# Patient Record
Sex: Male | Born: 1955 | Race: Black or African American | Hispanic: No | Marital: Single | Smoking: Current every day smoker
Health system: Southern US, Community
[De-identification: ages and names within clinical notes are randomized; demographics above are authoritative.]

## PROBLEM LIST (undated history)

## (undated) DIAGNOSIS — R55 Syncope and collapse: Secondary | ICD-10-CM

## (undated) DIAGNOSIS — I272 Pulmonary hypertension, unspecified: Secondary | ICD-10-CM

## (undated) DIAGNOSIS — I10 Essential (primary) hypertension: Secondary | ICD-10-CM

## (undated) DIAGNOSIS — I428 Other cardiomyopathies: Secondary | ICD-10-CM

## (undated) DIAGNOSIS — R1013 Epigastric pain: Secondary | ICD-10-CM

## (undated) DIAGNOSIS — Z720 Tobacco use: Secondary | ICD-10-CM

## (undated) DIAGNOSIS — D72819 Decreased white blood cell count, unspecified: Secondary | ICD-10-CM

## (undated) DIAGNOSIS — R05 Cough: Secondary | ICD-10-CM

## (undated) DIAGNOSIS — I5042 Chronic combined systolic (congestive) and diastolic (congestive) heart failure: Secondary | ICD-10-CM

## (undated) DIAGNOSIS — F1010 Alcohol abuse, uncomplicated: Secondary | ICD-10-CM

## (undated) DIAGNOSIS — H6690 Otitis media, unspecified, unspecified ear: Secondary | ICD-10-CM

## (undated) HISTORY — DX: Other cardiomyopathies: I42.8

## (undated) HISTORY — DX: Alcohol abuse, uncomplicated: F10.10

## (undated) HISTORY — DX: Cough: R05

## (undated) HISTORY — DX: Otitis media, unspecified, unspecified ear: H66.90

## (undated) HISTORY — PX: CLAVICLE SURGERY: SHX598

## (undated) HISTORY — DX: Chronic combined systolic (congestive) and diastolic (congestive) heart failure: I50.42

## (undated) HISTORY — DX: Epigastric pain: R10.13

## (undated) HISTORY — DX: Pulmonary hypertension, unspecified: I27.20

## (undated) HISTORY — DX: Syncope and collapse: R55

## (undated) HISTORY — DX: Essential (primary) hypertension: I10

## (undated) HISTORY — PX: STOMACH SURGERY: SHX791

## (undated) HISTORY — DX: Tobacco use: Z72.0

## (undated) HISTORY — DX: Decreased white blood cell count, unspecified: D72.819

---

## 1997-10-24 ENCOUNTER — Emergency Department (HOSPITAL_COMMUNITY): Admission: EM | Admit: 1997-10-24 | Discharge: 1997-10-24 | Payer: Self-pay | Admitting: Emergency Medicine

## 2002-09-28 ENCOUNTER — Emergency Department (HOSPITAL_COMMUNITY): Admission: EM | Admit: 2002-09-28 | Discharge: 2002-09-28 | Payer: Self-pay | Admitting: Emergency Medicine

## 2007-12-18 ENCOUNTER — Emergency Department (HOSPITAL_COMMUNITY): Admission: EM | Admit: 2007-12-18 | Discharge: 2007-12-18 | Payer: Self-pay | Admitting: Emergency Medicine

## 2007-12-18 IMAGING — CR DG KNEE COMPLETE 4+V*R*
4 series · 4 of 4 positions shown · non-contrast
Comparison: None

CLINICAL DATA: Right knee pain

RIGHT KNEE - COMPLETE 4+ VIEW

[t knee ap right]
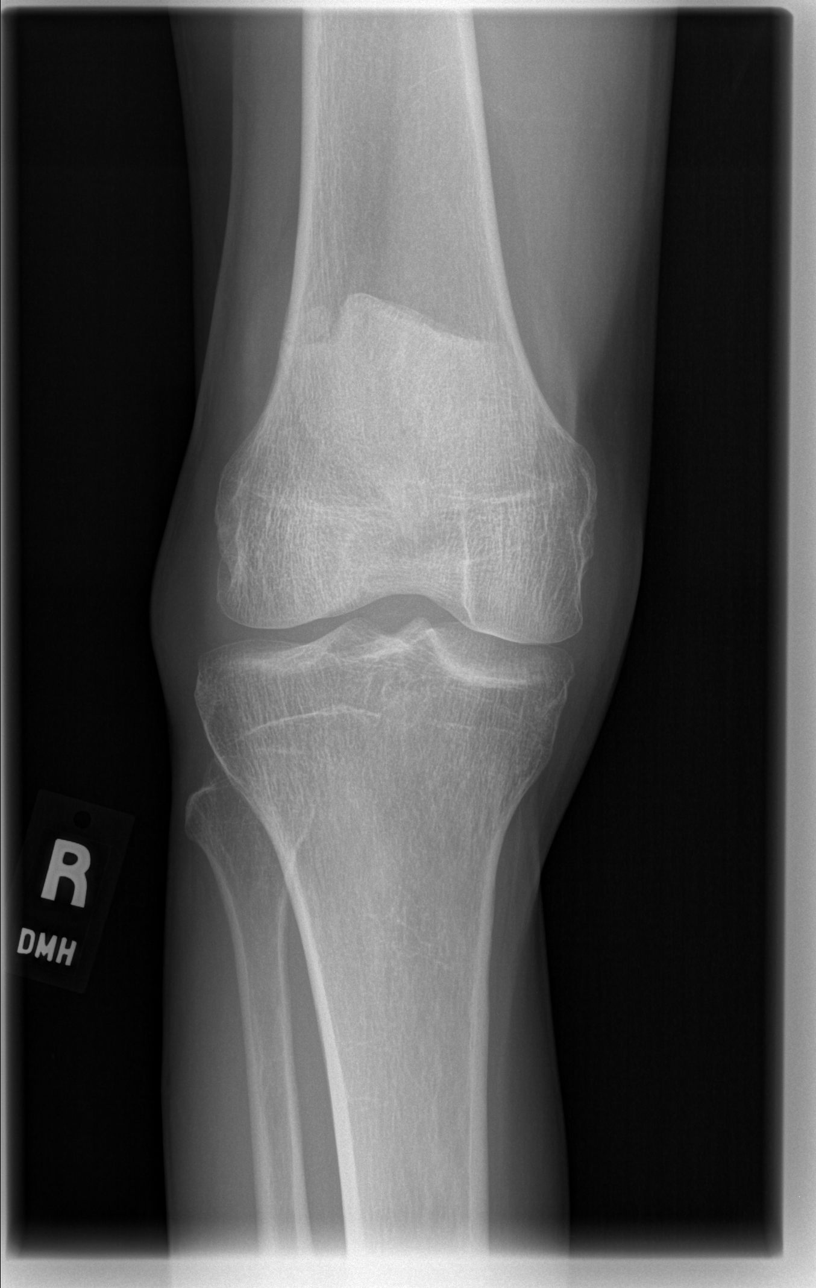

[t knee oblique right (1 of 2)]
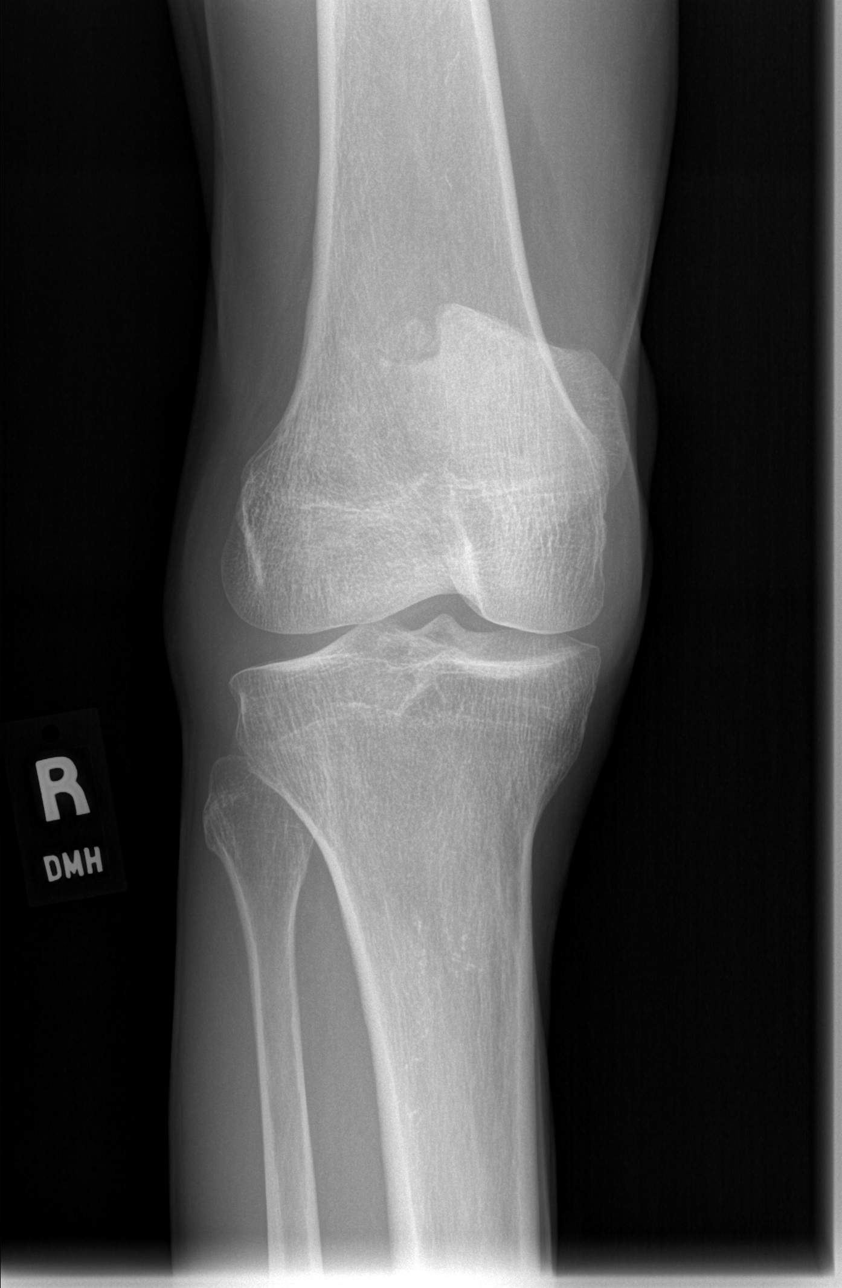

[t knee oblique right (2 of 2)]
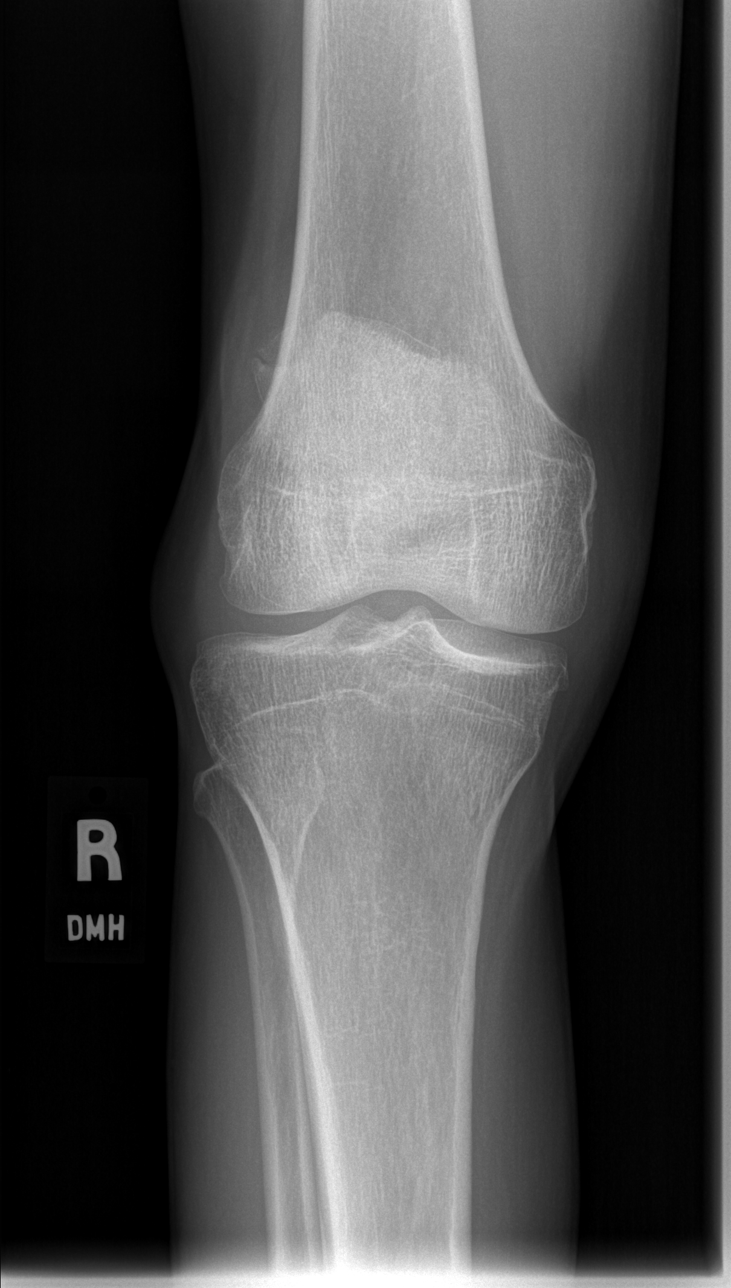

[t knee lat right]
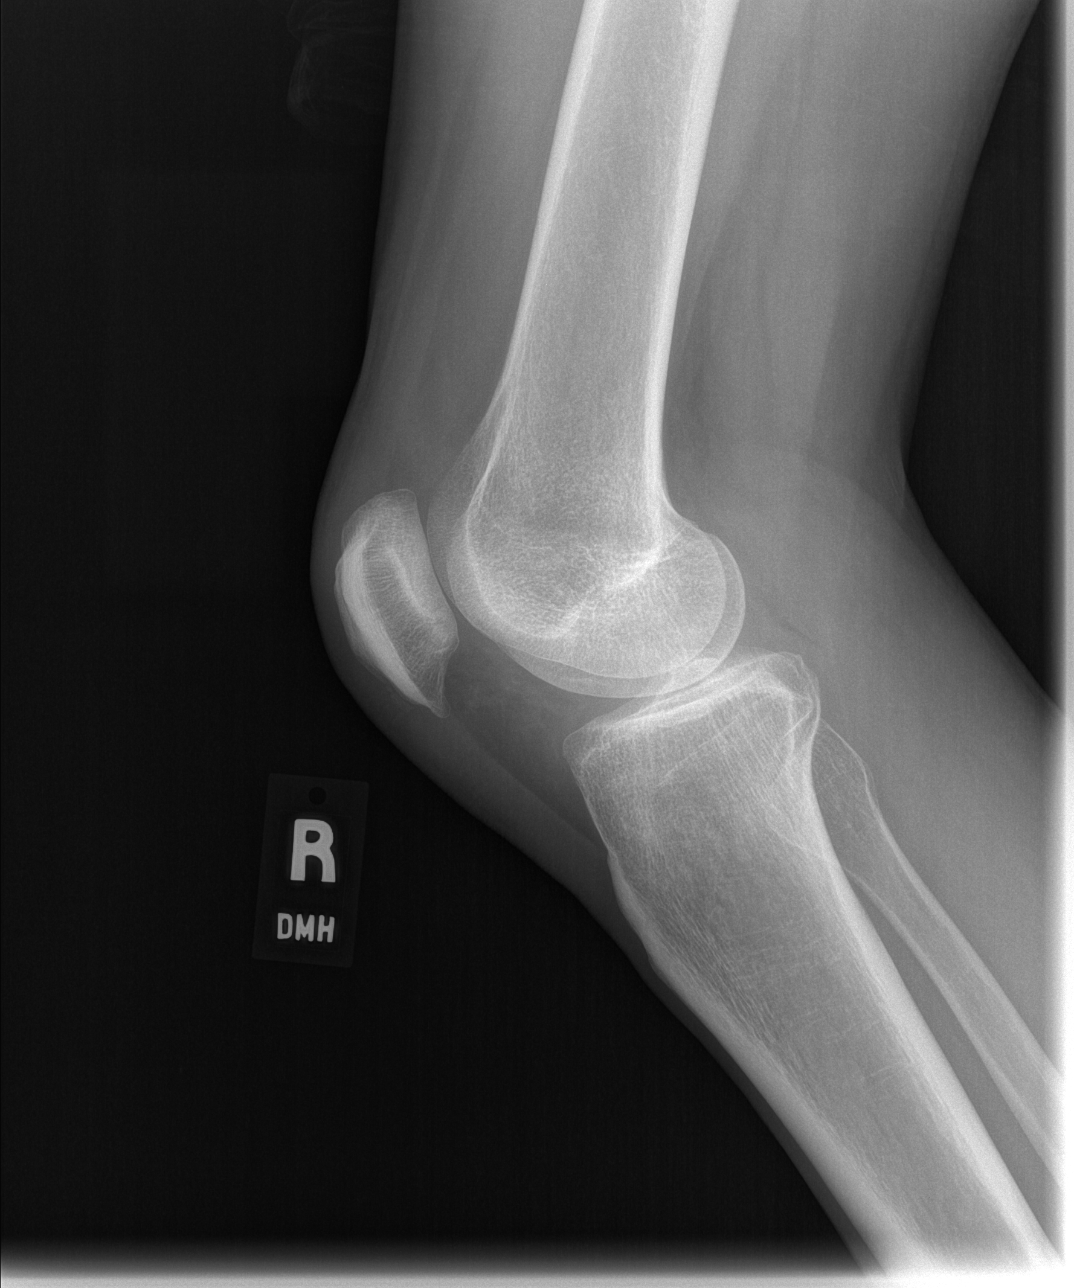

[4 of 4 positions shown; findings below may reference images not displayed]

FINDINGS: No fracture or subluxation.  No joint effusion.
Incidental bipartite patella.
IMPRESSION: No acute findings.

## 2011-02-03 ENCOUNTER — Inpatient Hospital Stay (HOSPITAL_COMMUNITY)
Admission: EM | Admit: 2011-02-03 | Discharge: 2011-02-03 | DRG: 494 | Disposition: A | Discharge status: Home / Self Care | Payer: Self-pay | Attending: Orthopedic Surgery | Admitting: Orthopedic Surgery

## 2011-02-03 ENCOUNTER — Emergency Department (HOSPITAL_COMMUNITY): Payer: Self-pay

## 2011-02-03 ENCOUNTER — Other Ambulatory Visit: Payer: Self-pay

## 2011-02-03 ENCOUNTER — Encounter (HOSPITAL_COMMUNITY)
Admission: EM | Disposition: A | Discharge status: Home / Self Care | Payer: Self-pay | Source: Home / Self Care | Attending: Orthopedic Surgery

## 2011-02-03 ENCOUNTER — Emergency Department (HOSPITAL_COMMUNITY): Payer: Self-pay | Admitting: Anesthesiology

## 2011-02-03 ENCOUNTER — Encounter: Payer: Self-pay | Admitting: *Deleted

## 2011-02-03 ENCOUNTER — Encounter (HOSPITAL_COMMUNITY): Payer: Self-pay | Admitting: Anesthesiology

## 2011-02-03 ENCOUNTER — Encounter (HOSPITAL_COMMUNITY): Payer: Self-pay | Admitting: *Deleted

## 2011-02-03 DIAGNOSIS — F17200 Nicotine dependence, unspecified, uncomplicated: Secondary | ICD-10-CM | POA: Diagnosis present

## 2011-02-03 DIAGNOSIS — I10 Essential (primary) hypertension: Secondary | ICD-10-CM | POA: Diagnosis present

## 2011-02-03 DIAGNOSIS — S82853A Displaced trimalleolar fracture of unspecified lower leg, initial encounter for closed fracture: Principal | ICD-10-CM | POA: Diagnosis present

## 2011-02-03 DIAGNOSIS — W010XXA Fall on same level from slipping, tripping and stumbling without subsequent striking against object, initial encounter: Secondary | ICD-10-CM | POA: Diagnosis present

## 2011-02-03 HISTORY — PX: ORIF ANKLE FRACTURE: SHX5408

## 2011-02-03 LAB — CBC
MCHC: 33.3 g/dL (ref 30.0–36.0)
MCV: 95.6 fL (ref 78.0–100.0)
Platelets: 230 10*3/uL (ref 150–400)
RBC: 3.86 MIL/uL — ABNORMAL LOW (ref 4.22–5.81)
RDW: 14 % (ref 11.5–15.5)

## 2011-02-03 LAB — POCT I-STAT, CHEM 8
BUN: 14 mg/dL (ref 6–23)
Creatinine, Ser: 0.9 mg/dL (ref 0.50–1.35)
Sodium: 141 mEq/L (ref 135–145)
TCO2: 23 mmol/L (ref 0–100)

## 2011-02-03 LAB — POCT I-STAT TROPONIN I: Troponin i, poc: 0.01 ng/mL (ref 0.00–0.08)

## 2011-02-03 IMAGING — CR DG ANKLE COMPLETE 3+V*L*
3 series · 3 of 3 positions shown · non-contrast
Comparison: None.

CLINICAL DATA: Left ankle pain

LEFT ANKLE COMPLETE - 3+ VIEW

[x ankle ap left]
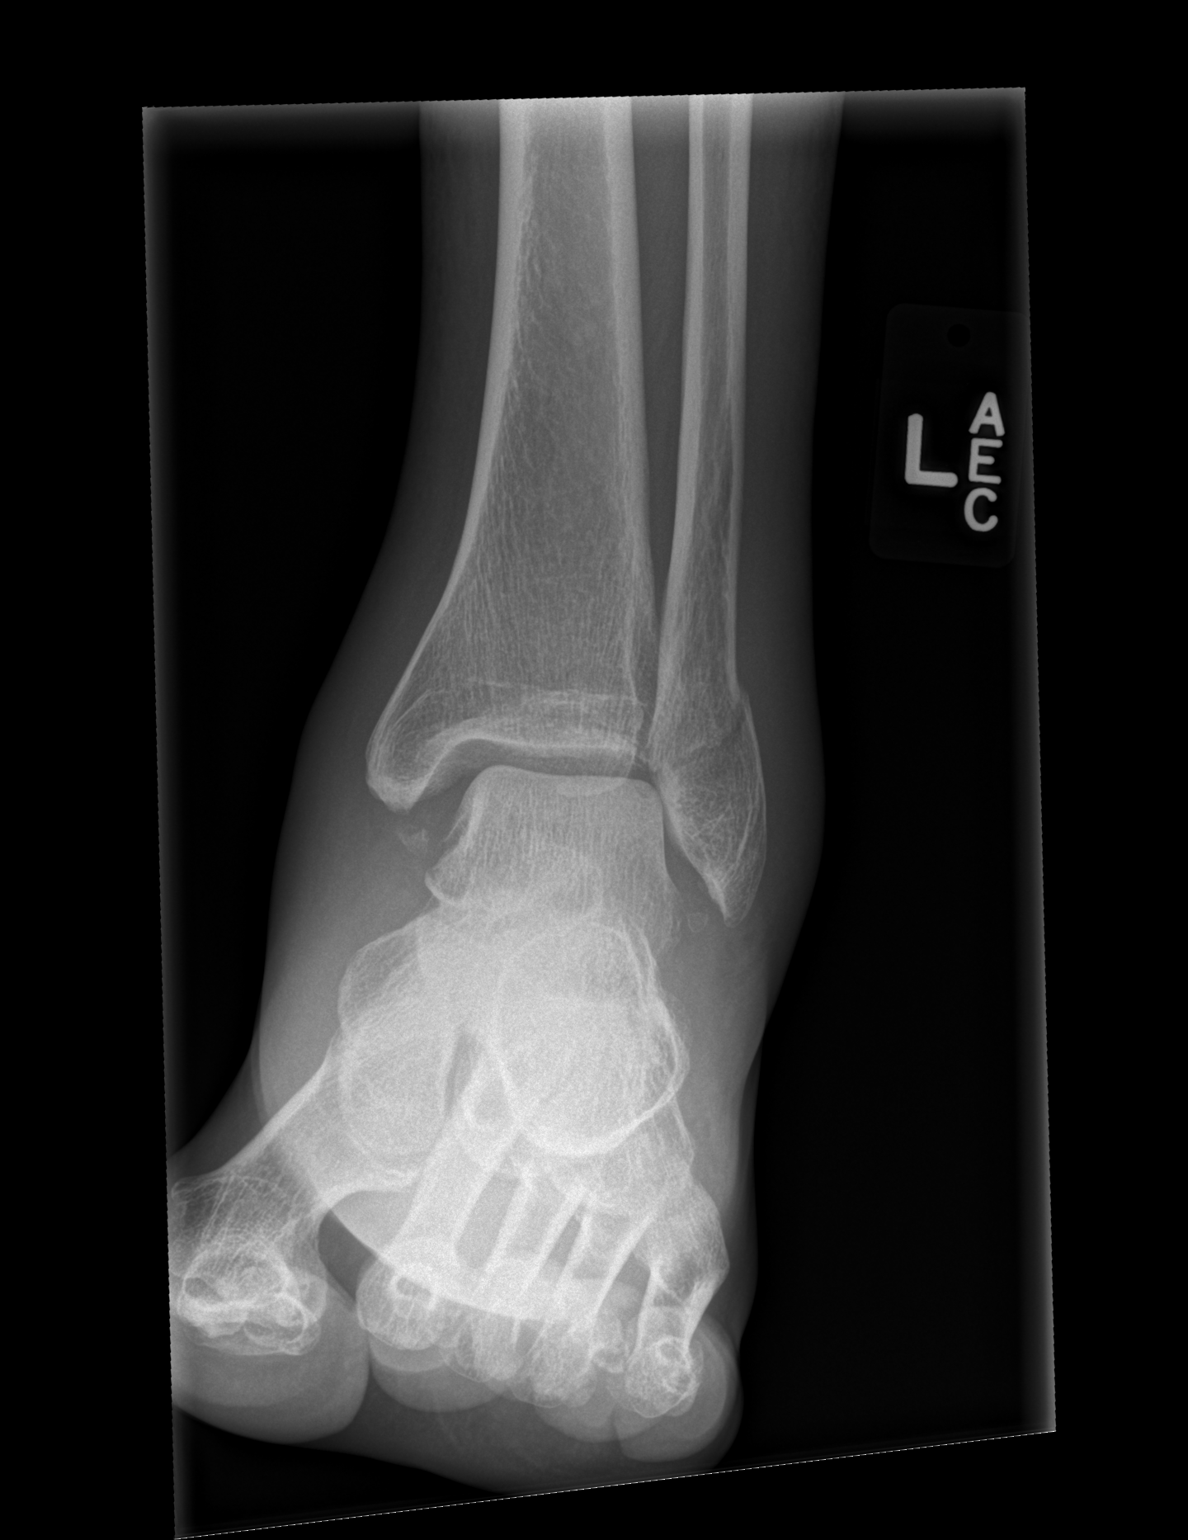

[x ankle obl left]
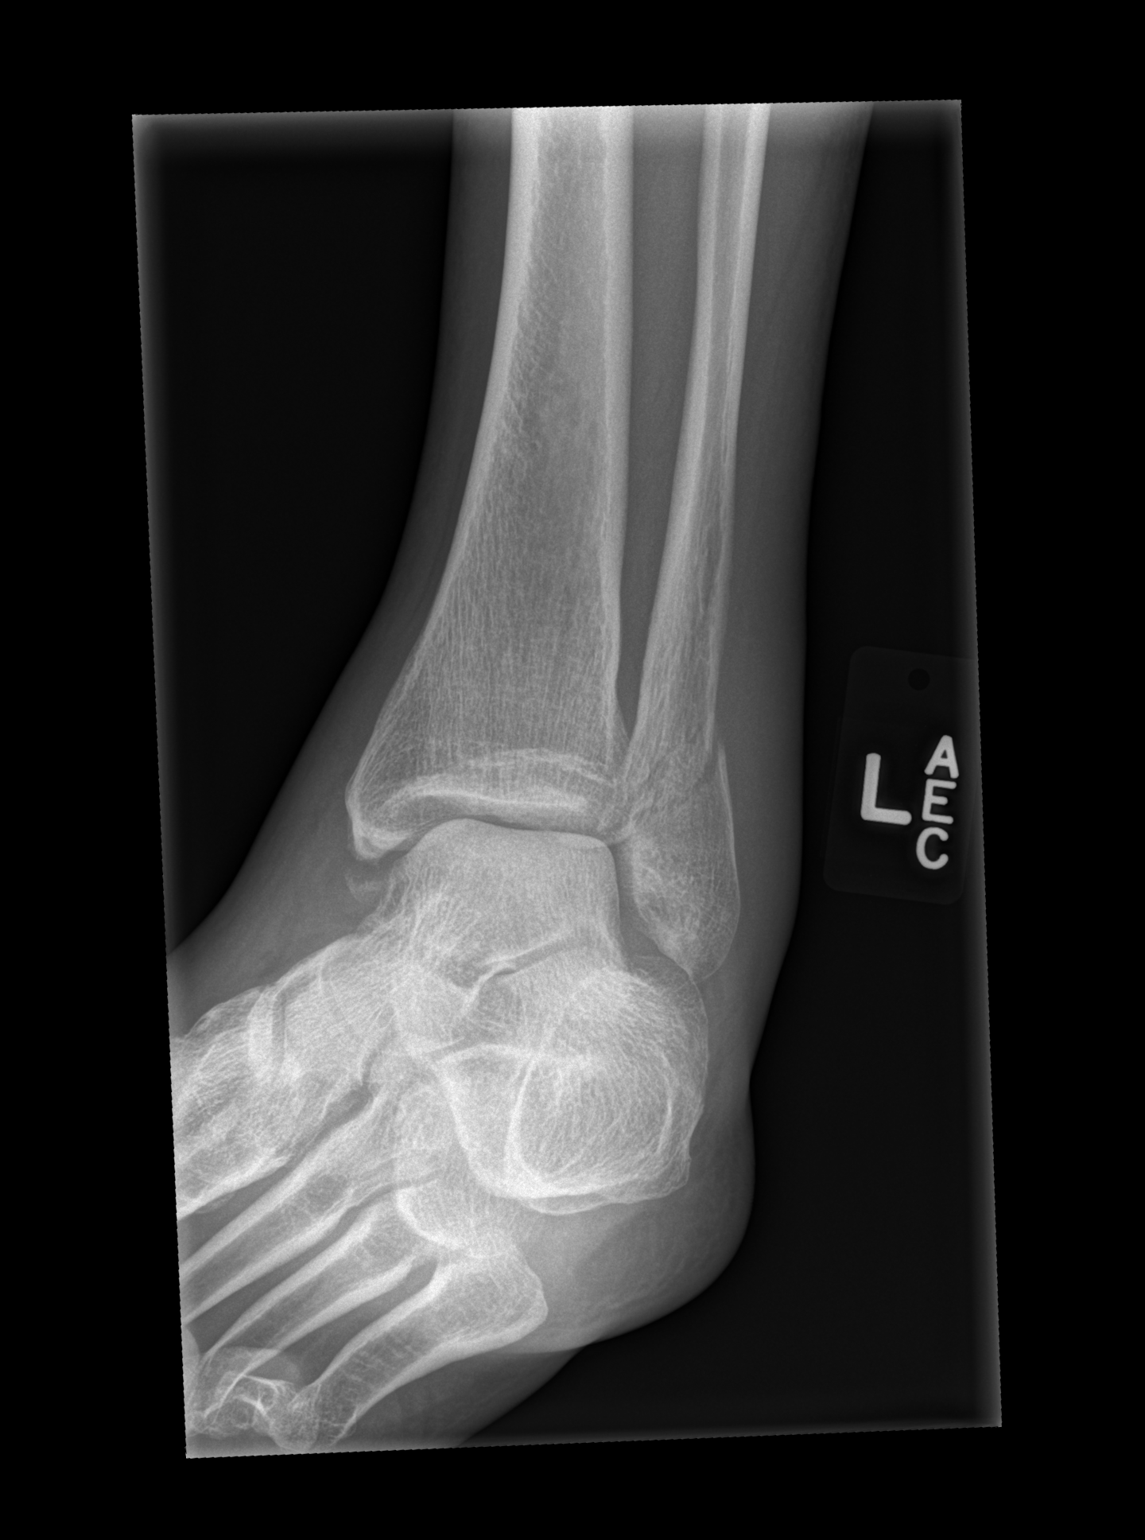

[x ankle lat left]
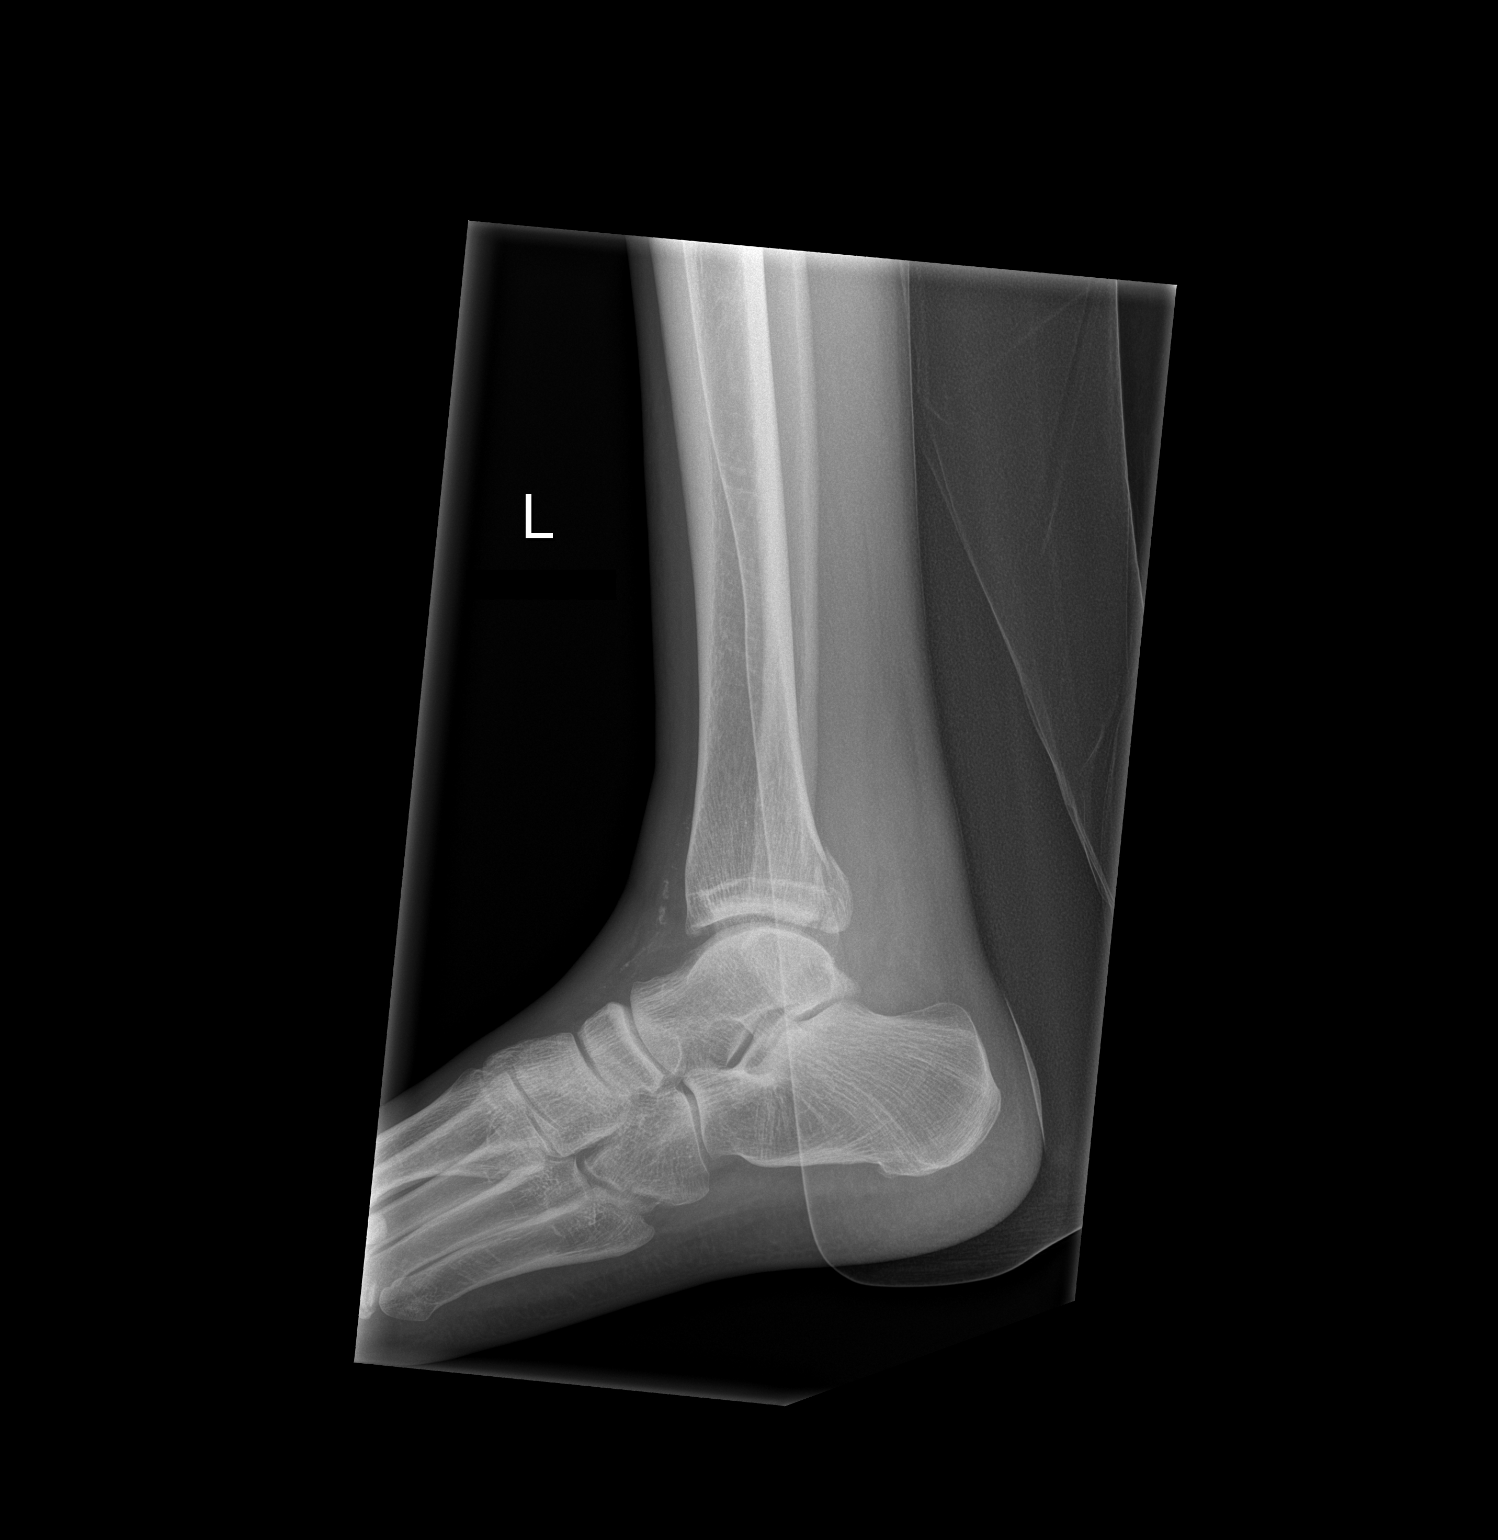

[3 of 3 positions shown; findings below may reference images not displayed]

FINDINGS: There is a trimalleolar fracture.  There is mild lateral
displacement of the ankle mortise.

Mild diffuse soft tissue swelling identified.
IMPRESSION: 1.  Findings compatible with mildly displaced trimalleolar
fracture.

## 2011-02-03 IMAGING — CR DG CHEST 2V
2 series · 2 of 2 positions shown · non-contrast
Comparison: None

CLINICAL DATA: 54-year-old male - preoperative respiratory
examination for ankle surgery.

CHEST - 2 VIEW

[w chest lat]
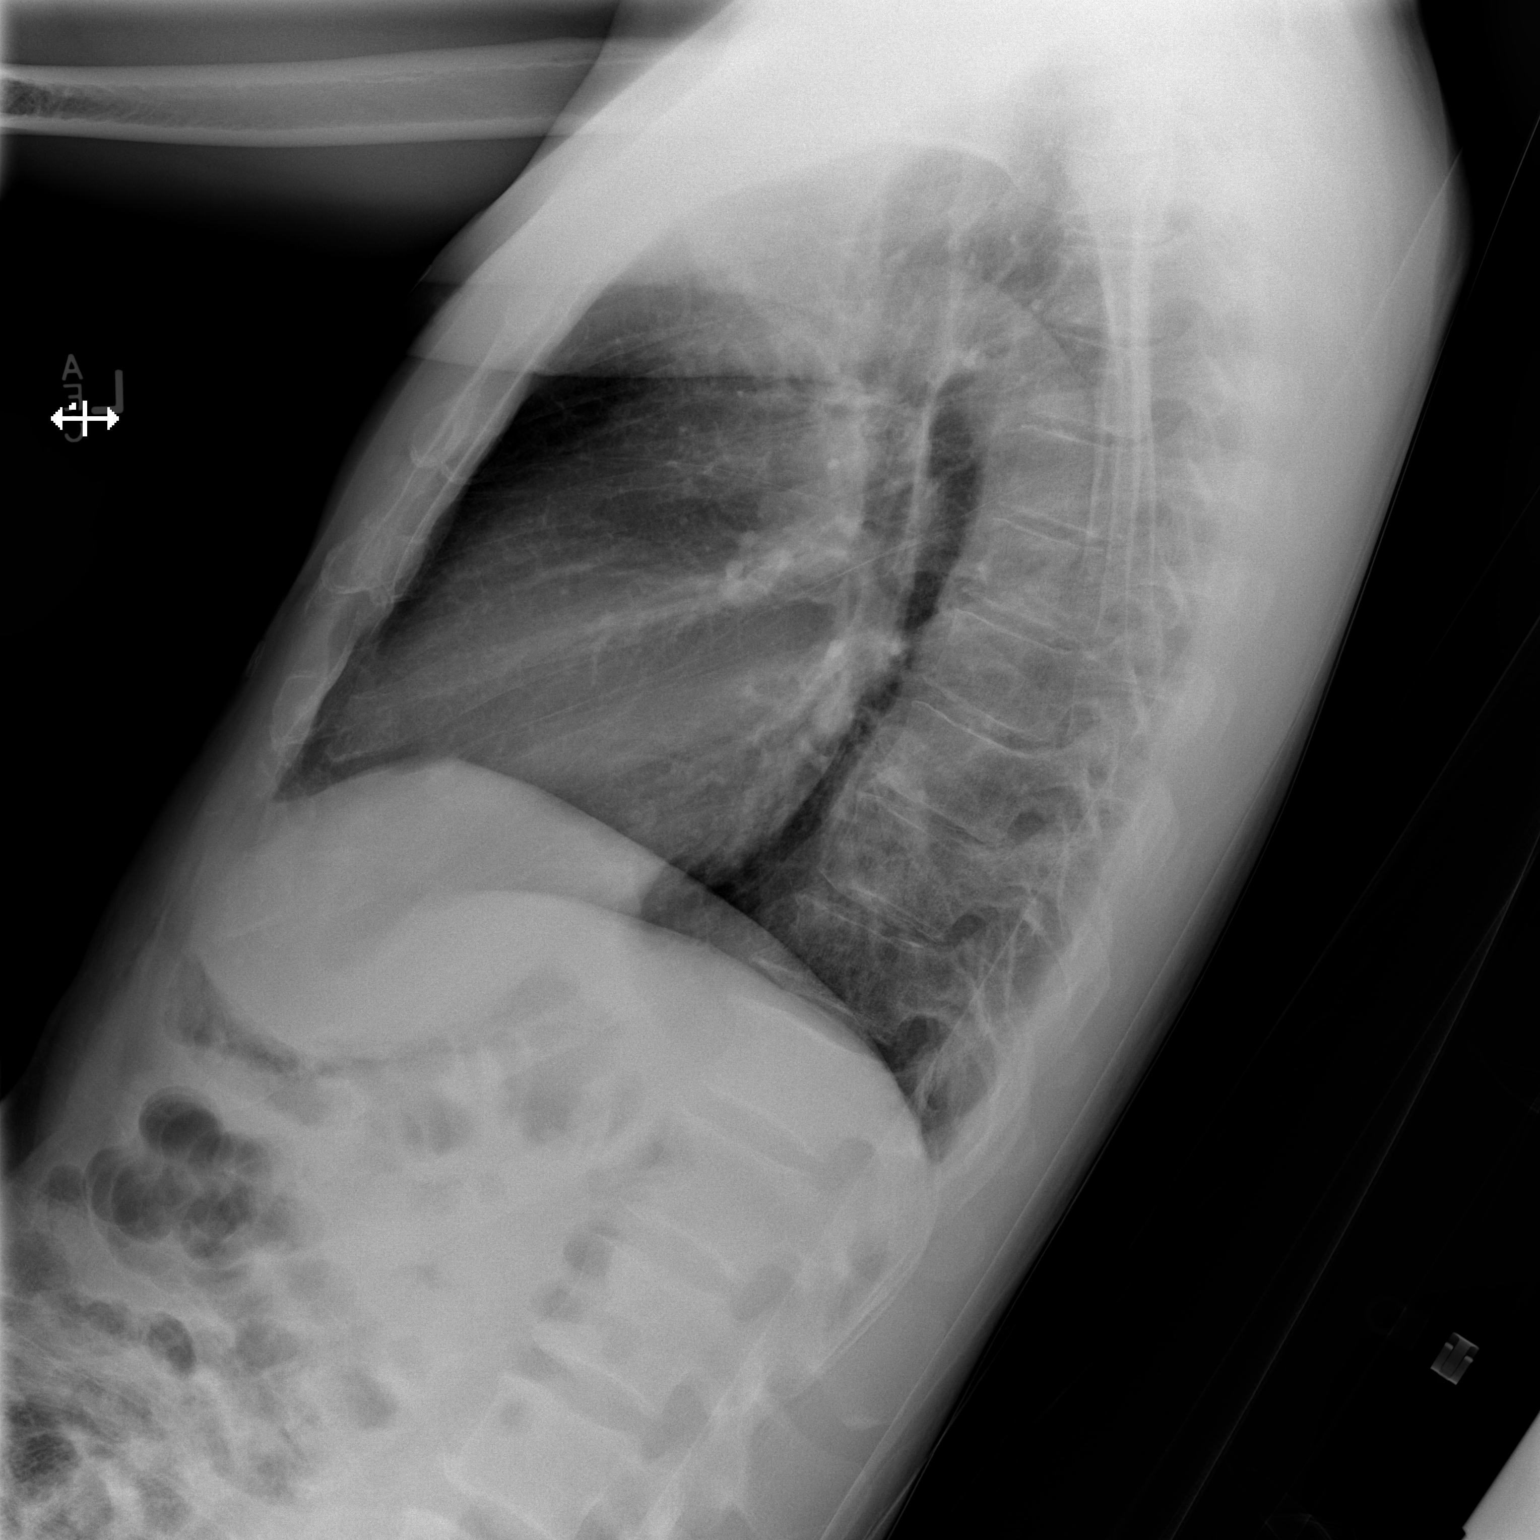

[x chest ap]
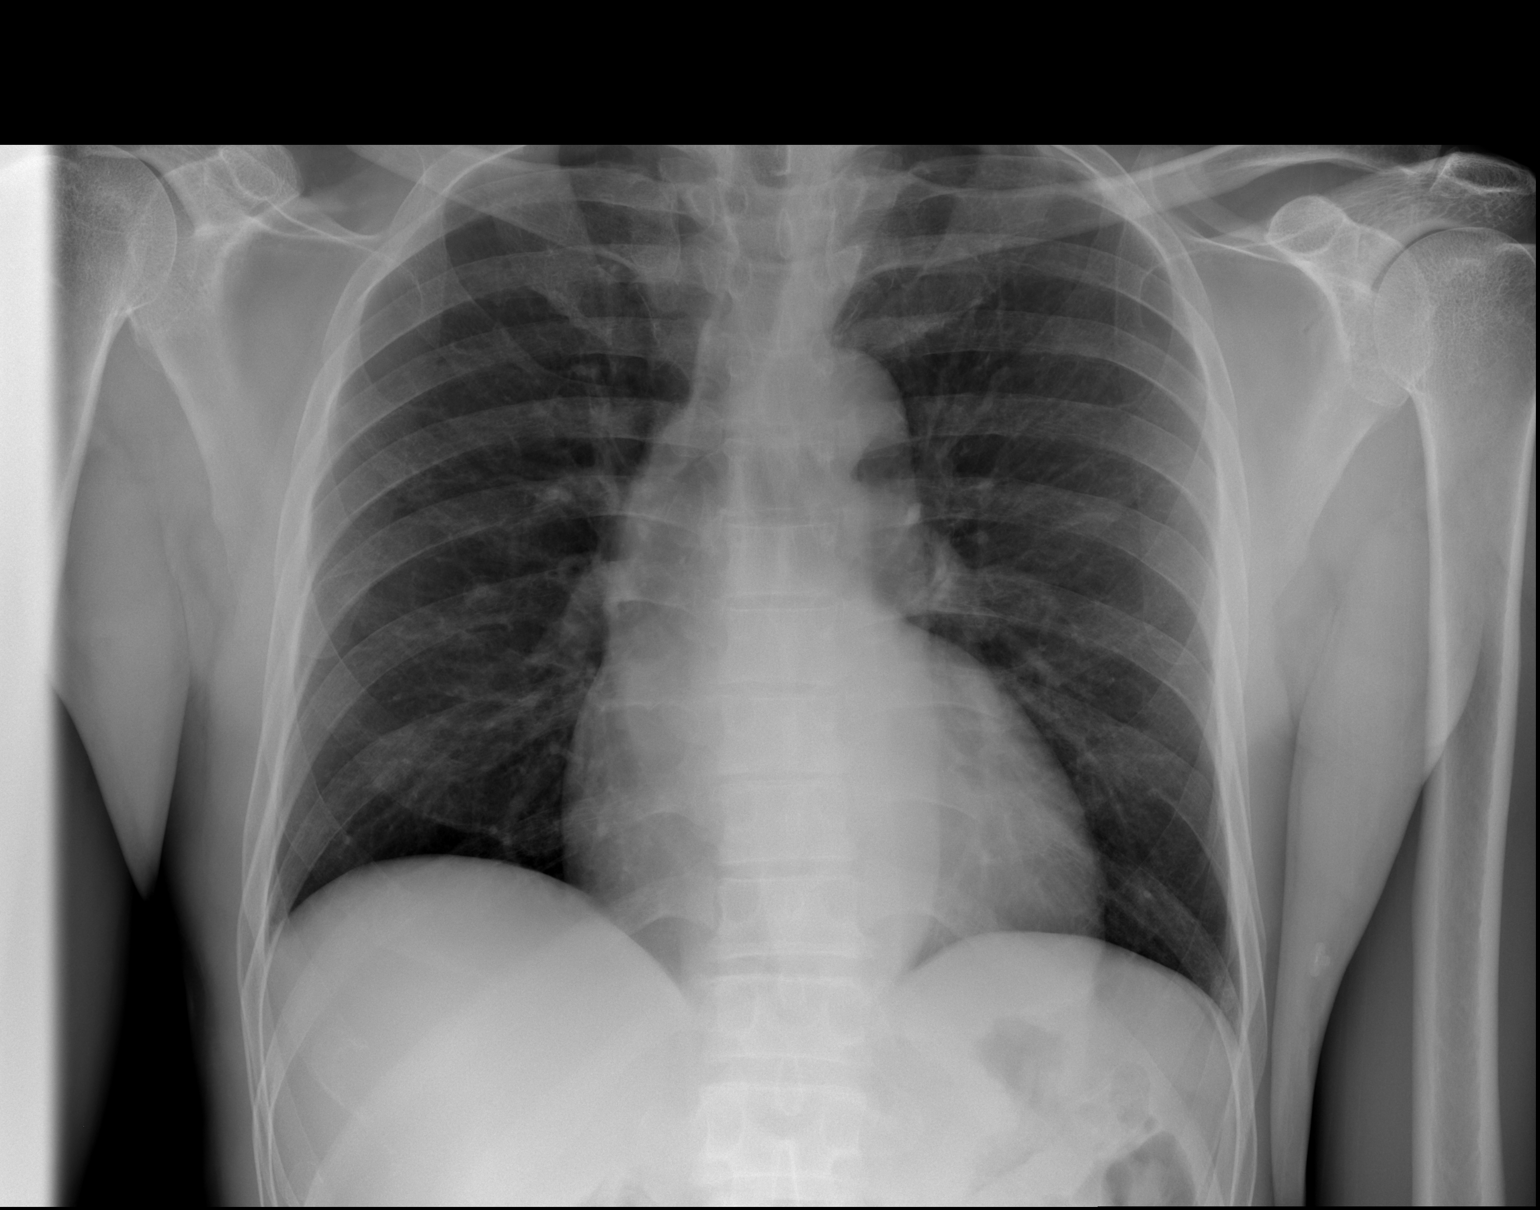

[2 of 2 positions shown; findings below may reference images not displayed]

FINDINGS: Mild cardiomegaly is noted.
The lungs are clear.
There is no evidence of focal airspace disease, pulmonary edema,
pulmonary nodule/mass, pleural effusion, or pneumothorax.
No acute bony abnormalities are identified.
IMPRESSION: Mild cardiomegaly without evidence of active cardiopulmonary
disease.

## 2011-02-03 SURGERY — OPEN REDUCTION INTERNAL FIXATION (ORIF) ANKLE FRACTURE
Anesthesia: General | Site: Ankle | Laterality: Left | Wound class: Clean

## 2011-02-03 SURGERY — OPEN REDUCTION INTERNAL FIXATION (ORIF) ANKLE FRACTURE
Anesthesia: General | Laterality: Left

## 2011-02-03 MED ORDER — PROMETHAZINE HCL 25 MG/ML IJ SOLN: 6.2500 mg | INTRAMUSCULAR | Status: DC | PRN

## 2011-02-03 MED ORDER — LACTATED RINGERS IV SOLN: INTRAVENOUS | Status: DC

## 2011-02-03 MED ORDER — HYDROMORPHONE HCL PF 1 MG/ML IJ SOLN
0.5000 mg | INTRAMUSCULAR | Status: DC | PRN
  Administered 2011-02-03: 0.5 mg via INTRAVENOUS
  Filled 2011-02-03: qty 1

## 2011-02-03 MED ORDER — BUPIVACAINE-EPINEPHRINE PF 0.5-1:200000 % IJ SOLN
INTRAMUSCULAR | Status: DC | PRN
  Administered 2011-02-03: 10 mL

## 2011-02-03 MED ORDER — HYDROMORPHONE HCL PF 1 MG/ML IJ SOLN: 0.2500 mg | INTRAMUSCULAR | Status: DC | PRN

## 2011-02-03 MED ORDER — LIDOCAINE HCL (CARDIAC) 20 MG/ML IV SOLN
INTRAVENOUS | Status: DC | PRN
  Administered 2011-02-03: 100 mg via INTRAVENOUS

## 2011-02-03 MED ORDER — FENTANYL CITRATE 0.05 MG/ML IJ SOLN
100.0000 ug | Freq: Once | INTRAMUSCULAR | Status: AC
  Administered 2011-02-03: 100 ug via INTRAMUSCULAR
  Filled 2011-02-03: qty 2

## 2011-02-03 MED ORDER — ACETAMINOPHEN 10 MG/ML IV SOLN
INTRAVENOUS | Status: DC | PRN
  Administered 2011-02-03: 1000 mg via INTRAVENOUS

## 2011-02-03 MED ORDER — OXYCODONE HCL 5 MG PO TABS: ORAL_TABLET | ORAL | Status: DC

## 2011-02-03 MED ORDER — ACETAMINOPHEN 10 MG/ML IV SOLN
INTRAVENOUS | Status: AC
  Filled 2011-02-03: qty 100

## 2011-02-03 MED ORDER — SUCCINYLCHOLINE CHLORIDE 20 MG/ML IJ SOLN
INTRAMUSCULAR | Status: DC | PRN
  Administered 2011-02-03: 80 mg via INTRAVENOUS

## 2011-02-03 MED ORDER — CEFAZOLIN SODIUM 1-5 GM-% IV SOLN
INTRAVENOUS | Status: DC | PRN
  Administered 2011-02-03: 1 g via INTRAVENOUS

## 2011-02-03 MED ORDER — LACTATED RINGERS IV SOLN
INTRAVENOUS | Status: DC | PRN
  Administered 2011-02-03 (×2): via INTRAVENOUS

## 2011-02-03 MED ORDER — SUFENTANIL CITRATE 50 MCG/ML IV SOLN
INTRAVENOUS | Status: DC | PRN
  Administered 2011-02-03: 20 ug via INTRAVENOUS
  Administered 2011-02-03: 10 ug via INTRAVENOUS

## 2011-02-03 MED ORDER — CEFAZOLIN SODIUM 1-5 GM-% IV SOLN
INTRAVENOUS | Status: AC
  Filled 2011-02-03: qty 50

## 2011-02-03 MED ORDER — OXYCODONE HCL 5 MG PO TABS
10.0000 mg | ORAL_TABLET | ORAL | Status: DC | PRN
  Administered 2011-02-03: 5 mg via ORAL
  Filled 2011-02-03: qty 1

## 2011-02-03 MED ORDER — SODIUM CHLORIDE 0.9 % IR SOLN
Status: DC | PRN
  Administered 2011-02-03: 1000 mL

## 2011-02-03 MED ORDER — SODIUM CHLORIDE 0.9 % IV SOLN: INTRAVENOUS | Status: DC

## 2011-02-03 MED ORDER — PROPOFOL 10 MG/ML IV EMUL
INTRAVENOUS | Status: DC | PRN
  Administered 2011-02-03: 180 mg via INTRAVENOUS

## 2011-02-03 MED ORDER — EPHEDRINE SULFATE 50 MG/ML IJ SOLN
INTRAMUSCULAR | Status: DC | PRN
  Administered 2011-02-03 (×2): 10 mg via INTRAVENOUS

## 2011-02-03 MED ORDER — BUPIVACAINE HCL (PF) 0.5 % IJ SOLN
INTRAMUSCULAR | Status: AC
  Filled 2011-02-03: qty 30

## 2011-02-03 MED ORDER — KETOROLAC TROMETHAMINE 30 MG/ML IJ SOLN
INTRAMUSCULAR | Status: DC | PRN
  Administered 2011-02-03: 30 mg via INTRAVENOUS

## 2011-02-03 MED ORDER — ONDANSETRON HCL 4 MG/2ML IJ SOLN
INTRAMUSCULAR | Status: DC | PRN
  Administered 2011-02-03: 4 mg via INTRAVENOUS

## 2011-02-03 SURGICAL SUPPLY — 48 items
BAG SPEC THK2 15X12 ZIP CLS (MISCELLANEOUS) ×1
BAG ZIPLOCK 12X15 (MISCELLANEOUS) ×2 IMPLANT
BANDAGE ELASTIC 4 VELCRO ST LF (GAUZE/BANDAGES/DRESSINGS) ×2 IMPLANT
BIT DRILL 2.5X2.75 QC CALB (BIT) ×1 IMPLANT
BIT DRILL 2.9X70 QC CALB (BIT) ×1 IMPLANT
CLOTH BEACON ORANGE TIMEOUT ST (SAFETY) ×2 IMPLANT
COVER SURGICAL LIGHT HANDLE (MISCELLANEOUS) ×1 IMPLANT
CUFF TOURN SGL QUICK 34 (TOURNIQUET CUFF) ×2
CUFF TRNQT CYL 34X4X40X1 (TOURNIQUET CUFF) ×1 IMPLANT
DRAPE C-ARM 42X72 X-RAY (DRAPES) ×2 IMPLANT
DRAPE U-SHAPE 47X51 STRL (DRAPES) ×2 IMPLANT
DRSG ADAPTIC 3X8 NADH LF (GAUZE/BANDAGES/DRESSINGS) ×2 IMPLANT
DRSG PAD ABDOMINAL 8X10 ST (GAUZE/BANDAGES/DRESSINGS) ×2 IMPLANT
ELECT REM PT RETURN 9FT ADLT (ELECTROSURGICAL) ×2
ELECTRODE REM PT RTRN 9FT ADLT (ELECTROSURGICAL) ×1 IMPLANT
GAUZE KERLIX 2  STERILE LF (GAUZE/BANDAGES/DRESSINGS) ×1 IMPLANT
GAUZE XEROFORM 4X4 STRL (GAUZE/BANDAGES/DRESSINGS) ×1 IMPLANT
GLOVE BIO SURGEON STRL SZ7.5 (GLOVE) ×2 IMPLANT
GLOVE SURG ORTHO 7.0 STRL STRW (GLOVE) ×2 IMPLANT
GLOVE SURG ORTHO 8.0 STRL STRW (GLOVE) ×2 IMPLANT
GOWN STRL REIN XL XLG (GOWN DISPOSABLE) ×4 IMPLANT
MANIFOLD NEPTUNE II (INSTRUMENTS) ×2 IMPLANT
PACK LOWER EXTREMITY WL (CUSTOM PROCEDURE TRAY) ×2 IMPLANT
PAD CAST 4YDX4 CTTN HI CHSV (CAST SUPPLIES) ×2 IMPLANT
PADDING CAST 4 (CAST SUPPLIES) ×1 IMPLANT
PADDING CAST COTTON 4X4 STRL (CAST SUPPLIES) ×4
PADDING WEBRIL 4 STERILE (GAUZE/BANDAGES/DRESSINGS) ×1 IMPLANT
PLATE ACE 100DEG 8HOLE (Plate) ×1 IMPLANT
POSITIONER SURGICAL ARM (MISCELLANEOUS) ×2 IMPLANT
SCREW CORTICAL 3.5MM  12MM (Screw) ×2 IMPLANT
SCREW CORTICAL 3.5MM  20MM (Screw) ×1 IMPLANT
SCREW CORTICAL 3.5MM 12MM (Screw) IMPLANT
SCREW CORTICAL 3.5MM 14MM (Screw) ×1 IMPLANT
SCREW CORTICAL 3.5MM 18MM (Screw) ×1 IMPLANT
SCREW CORTICAL 3.5MM 20MM (Screw) IMPLANT
SCREW LAG CANC FT 4X48 (Screw) ×1 IMPLANT
SCREW NLOCK CANC HEX 4X55 (Screw) ×1 IMPLANT
SPLINT DURACAST 5 X20' (CAST SUPPLIES) ×1 IMPLANT
SPLINT FIBERGLASS 5X30 (CAST SUPPLIES) ×1 IMPLANT
SPONGE GAUZE 4X4 12PLY (GAUZE/BANDAGES/DRESSINGS) ×2 IMPLANT
STAPLER VISISTAT 35W (STAPLE) ×1 IMPLANT
STRIP CLOSURE SKIN 1/2X4 (GAUZE/BANDAGES/DRESSINGS) ×1 IMPLANT
SUT MNCRL AB 4-0 PS2 18 (SUTURE) ×1 IMPLANT
SUT VIC AB 1 CT1 27 (SUTURE) ×2
SUT VIC AB 1 CT1 27XBRD ANTBC (SUTURE) ×2 IMPLANT
SUT VIC AB 2-0 CT1 27 (SUTURE) ×2
SUT VIC AB 2-0 CT1 TAPERPNT 27 (SUTURE) ×1 IMPLANT
TOWEL OR 17X26 10 PK STRL BLUE (TOWEL DISPOSABLE) ×4 IMPLANT

## 2011-02-03 NOTE — H&P (Signed)
Darin Brady is an 55 y.o. male.   Chief Complaint: left ankle fracture HPI: pt slipped off curb had immediate pain in left ankle.  Seen in [facility] ED.    History reviewed. No pertinent past medical history. HTN no current meds  History reviewed. No pertinent past surgical history. Shoulder surgery Abdominal surgery   No family history on file. Social History:  reports that he has been smoking.  He does not have any smokeless tobacco history on file. He reports that he drinks alcohol. He reports that he does not use illicit drugs.  Allergies: No Known Allergies  Medications Prior to Admission  Medication Dose Route Frequency Provider Last Rate Last Dose  . fentaNYL (SUBLIMAZE) injection 100 mcg  100 mcg Intramuscular Once [place], PA   100 mcg at 02/03/11 9147   No current outpatient prescriptions on file as of 02/03/2011.    Results for orders placed during the hospital encounter of 02/03/11 (from the past 48 hour(s))  CBC     Status: Abnormal   Collection Time   02/03/11  9:15 AM      Component Value Range Comment   WBC 7.8  4.0 - 10.5 (K/uL)    RBC 3.86 (*) 4.22 - 5.81 (MIL/uL)    Hemoglobin 12.3 (*) 13.0 - 17.0 (g/dL)    HCT 82.9 (*) 56.2 - 52.0 (%)    MCV 95.6  78.0 - 100.0 (fL)    MCH 31.9  26.0 - 34.0 (pg)    MCHC 33.3  30.0 - 36.0 (g/dL)    RDW 13.0  86.5 - 78.4 (%)    Platelets 230  150 - 400 (K/uL)   POCT I-STAT, CHEM 8     Status: Normal   Collection Time   02/03/11  9:34 AM      Component Value Range Comment   Sodium 141  135 - 145 (mEq/L)    Potassium 4.2  3.5 - 5.1 (mEq/L)    Chloride 105  96 - 112 (mEq/L)    BUN 14  6 - 23 (mg/dL)    Creatinine, Ser 6.96  0.50 - 1.35 (mg/dL)    Glucose, Bld 91  70 - 99 (mg/dL)    Calcium, Ion 2.95  1.12 - 1.32 (mmol/L)    TCO2 23  0 - 100 (mmol/L)    Hemoglobin 14.3  13.0 - 17.0 (g/dL)    HCT 28.4  13.2 - 44.0 (%)    Dg Chest 2 View  02/03/2011  *RADIOLOGY REPORT*  Clinical Data: 55 year old male -  preoperative respiratory examination for ankle surgery.  CHEST - 2 VIEW  Comparison: None  Findings: Mild cardiomegaly is noted. The lungs are clear. There is no evidence of focal airspace disease, pulmonary edema, pulmonary nodule/mass, pleural effusion, or pneumothorax. No acute bony abnormalities are identified.  IMPRESSION: Mild cardiomegaly without evidence of active cardiopulmonary disease.  Original Report Authenticated By: Rosendo Gros, M.D.   Dg Ankle Complete Left  02/03/2011  *RADIOLOGY REPORT*  Clinical Data: Left ankle pain  LEFT ANKLE COMPLETE - 3+ VIEW  Comparison: None.  Findings: There is a trimalleolar fracture.  There is mild lateral displacement of the ankle mortise.  Mild diffuse soft tissue swelling identified.  IMPRESSION:  1.  Findings compatible with mildly displaced trimalleolar fracture.  Original Report Authenticated By: Rosealee Albee, M.D.    ROS  10 point review of systems was reviewed and is noncontribuitory  Blood pressure 129/90, pulse 99, temperature 99.4 F (37.4  C), temperature source Oral, resp. rate 20, SpO2 98.00%. Physical Exam  Constitutional: He is oriented to person, place, and time. He appears well-developed.  HENT:  Head: Normocephalic.  Musculoskeletal: He exhibits edema and tenderness.       Left ankle: He exhibits decreased range of motion and swelling. tenderness.  Neurological: He is alert and oriented to person, place, and time.  Skin: Skin is warm and dry.     Assessment/Plan Left ankle fractrue. Plan is for ORIF today.  Risks and benefits explained consent obtained.  Keanan Melander JR,W D 02/03/2011, 10:32 AM

## 2011-02-03 NOTE — ED Provider Notes (Signed)
Patient relates he has a history of hypertension however he ran out his medicines a long time ago. He states last night after drinking he was running and he tripped on the curb and fell injuring his left ankle. He states he's unable to bear weight on it. Patient's noted to have diffuse swelling of his left ankle with diffuse pain to palpation. Pulses are intact. Patient last ate yesterday.   Medical screening examination/treatment/procedure(s) were performed by non-physician practitioner and as supervising physician I was immediately available for consultation/collaboration. Devoria Albe, MD, Armando Gang   Ward Givens, MD 02/03/11 [phone]

## 2011-02-03 NOTE — Anesthesia Procedure Notes (Signed)
Procedure Name: Intubation Date/Time: 02/03/2011 11:04 AM Performed by: Lurlean Leyden, Kenny Stern L. Patient Re-evaluated:Patient Re-evaluated prior to inductionOxygen Delivery Method: Circle System Utilized Preoxygenation: Pre-oxygenation with 100% oxygen Intubation Type: IV induction Ventilation: Mask ventilation without difficulty and Oral airway inserted - appropriate to patient size Laryngoscope Size: Miller and 3 Grade View: Grade II Tube type: Oral Tube size: 8.0 mm Number of attempts: 2 Airway Equipment and Method: stylet Placement Confirmation: ETT inserted through vocal cords under direct vision,  breath sounds checked- equal and bilateral and positive ETCO2 Secured at: 21 cm Tube secured with: Tape Dental Injury: Teeth and Oropharynx as per pre-operative assessment

## 2011-02-03 NOTE — ED Provider Notes (Signed)
See prior note   Ward Givens, MD 02/03/11 1601

## 2011-02-03 NOTE — ED Notes (Signed)
Taken to OR via stretcher--Stable. 

## 2011-02-03 NOTE — Progress Notes (Signed)
Physical Therapy Evaluation Patient Details Name: Darin Brady MRN: [id number] DOB: Jul 15, 1955 Today's Date: 02/03/2011 eval 1 [id number]  Problem List: There is no problem list on file for this patient.   Past Medical History:  Past Medical History  Diagnosis Date  . Hypertension     Previous anti-hypertensive meds but ran out  . Hyperthyroidism    Past Surgical History:  Past Surgical History  Procedure Date  . Clavicle surgery     15 years ago  . Stomach surgery     stop bleeding from a trauma    PT Assessment/Plan/Recommendation PT Assessment Clinical Impression Statement: all education completed, pt provided with crutches per ortho tech, pt has good family support and is anxious to d/c home today PT Recommendation/Assessment: Patent does not need any further PT services No Skilled PT: All education completed;Patient will have necessary level of assist by caregiver at discharge;Patient is supervision for all activity/mobility PT Goals     PT Evaluation Precautions/Restrictions  Restrictions Weight Bearing Restrictions: Yes LLE Weight Bearing: Non weight bearing Other Position/Activity Restrictions:  (pt educated moving toes and elevating LLE- edema control) Prior Functioning  Home Living Lives With: Family Receives Help From: Family Home Layout: One level Home Access: Stairs to enter Entrance Stairs-Rails: Right;Left (can't reach both) Entrance Stairs-Number of Steps: 3 Home Adaptive Equipment:  (old crutches, don't fit pt per family) Prior Function Level of Independence: Independent with transfers;Independent with gait;Independent with basic ADLs Able to Take Stairs?: Yes Cognition Cognition Arousal/Alertness: Awake/alert Overall Cognitive Status: Appears within functional limits for tasks assessed Orientation Level: Oriented X4 Sensation/Coordination Sensation Light Touch:  (intact to light touch toes of Left foot) Extremity Assessment RUE  Assessment RUE Assessment: Within Functional Limits LUE Assessment LUE Assessment: Within Functional Limits RLE Assessment RLE Assessment: Within Functional Limits LLE Assessment LLE Assessment: Not tested Mobility (including Balance) Bed Mobility Bed Mobility: Yes Supine to Sit: 5: Supervision;HOB flat Supine to Sit Details (indicate cue type and reason): for safety and IV Sit to Supine - Right: 5: Supervision;HOB flat (LEs elevated once in bed) Transfers Transfers: Yes Sit to Stand: 5: Supervision Sit to Stand Details (indicate cue type and reason): cues for NWB and safety with crutches Stand to Sit: 5: Supervision;To bed Stand to Sit Details: same Ambulation/Gait Ambulation/Gait: Yes Ambulation/Gait Assistance: 5: Supervision (to min/guard) Ambulation/Gait Assistance Details (indicate cue type and reason): cues NWB Ambulation Distance (Feet): 110 Feet Assistive device: Crutches Stairs: Yes Stairs Assistance: 4: Min assist Stairs Assistance Details (indicate cue type and reason): min/guard for safety, cues for NWB/sequence Stair Management Technique: One rail Right (1 crutch) Number of Stairs: 3     Exercise    End of Session PT - End of Session Equipment Utilized During Treatment: Gait belt Activity Tolerance: Patient tolerated treatment well Patient left: in bed;with call bell in reach;with family/visitor present Nurse Communication:  (pt ready for d/c) General Behavior During Session: [facility] for tasks performed Cognition: [facility] for tasks performed  [facility] 02/03/2011, 5:54 PM

## 2011-02-03 NOTE — ED Provider Notes (Signed)
History     CSN: [id number] Arrival date & time: 02/03/2011  7:46 AM   First MD Initiated Contact with Patient 02/03/11 0750      Chief Complaint  Patient presents with  . Ankle Pain    (Consider location/radiation/quality/duration/timing/severity/associated sxs/prior treatment) HPI  Patient presents to emergency department with his wife at bedside complaining of left ankle injury that happened last night at 6 PM when he was running in yard and tripped on the curb turning his ankle. Patient states that he "hobbled inside" and applied ice to ankle in hopes that ankle would improve but states increasing swelling and pain throughout the night and today and therefore presented to the ER. Patient denies numbness or tingling in foot. Patient states he is unable to bear weight without significant pain. Patient took Tylenol at 3 AM last night without relief of pain. Pain is aggravated by weight and touch and improved by lying still. Patient denies any other significant injury. Patient states he takes no medicine on a regular basis and has no known medical problems.  History reviewed. No pertinent past medical history.  History reviewed. No pertinent past surgical history.  No family history on file.  History  Substance Use Topics  . Smoking status: Current Everyday Smoker  . Smokeless tobacco: Not on file  . Alcohol Use: Yes     occasionally      Review of Systems  All other systems reviewed and are negative.    Allergies  Review of patient's allergies indicates no known allergies.  Home Medications  No current outpatient prescriptions on file.  BP 129/90  Pulse 99  Temp(Src) 99.4 F (37.4 C) (Oral)  Resp 20  SpO2 98%  Physical Exam  Nursing note and vitals reviewed. Constitutional: He is oriented to person, place, and time. He appears well-developed and well-nourished. No distress.  HENT:  Head: Normocephalic and atraumatic.  Eyes: Conjunctivae are normal.  Neck:  Normal range of motion. Neck supple.  Cardiovascular: Normal rate and regular rhythm.   Pulmonary/Chest: Effort normal.  Musculoskeletal: He exhibits edema and tenderness.       TTP of left ankle with significant soft tissue swelling. Tenderness to palpation of the lateral and medial malleolus with decreased range of motion due to pain. No tenderness to palpation forefoot. Good pedal pulse and cap refill. Wiggling toes. No tenderness to palpation of left knee or calf. No break in skin. No obvious deformity.  Neurological: He is alert and oriented to person, place, and time. He has normal reflexes.  Skin: Skin is warm and dry. No rash noted. No erythema. No pallor.    ED Course  Procedures (including critical care time)  Intramuscular fentanyl   Date: 02/03/2011  Rate: 87  Rhythm: normal sinus rhythm  QRS Axis: normal, LVH with strain  Intervals: normal  ST/T Wave abnormalities: normal  Conduction Disutrbances:none  Narrative Interpretation:   Old EKG Reviewed: new LVH and strain compared to EKG on Apr 17, 2007     Labs Reviewed  I-STAT, CHEM 8  CBC   Dg Ankle Complete Left  02/03/2011  *RADIOLOGY REPORT*  Clinical Data: Left ankle pain  LEFT ANKLE COMPLETE - 3+ VIEW  Comparison: None.  Findings: There is a trimalleolar fracture.  There is mild lateral displacement of the ankle mortise.  Mild diffuse soft tissue swelling identified.  IMPRESSION:  1.  Findings compatible with mildly displaced trimalleolar fracture.  Original Report Authenticated By: Rosealee Albee, M.D.  1. Trimalleolar fracture       MDM  Dr. Madelon Lips to see patient in ER for his trimalleolar fracture. Patient is n.p.o. in ER and states he last ate yesterday. Orthopedic specialist request preop labs, chest x-ray, and EKG. Patient has history of hypertension though he denies taking any hypertension meds for quite some time due to running out and sees no primary care physiciain on regular basis. Patient is  lying comfortably with no other complaints.        Jenness Corner, PA 02/03/11 9147  Jenness [place], PA 02/03/11 [phone]

## 2011-02-03 NOTE — ED Notes (Signed)
Pt reports "playing" and believes his L ankle hit the curb and feels as though it may be broken now. Some swelling to area noted. Happened 6pm last night.

## 2011-02-03 NOTE — Progress Notes (Signed)
Patient stable; discharged home with family members.  Discharge instructions given by Ernie Hew, RN.  Patient transported via wheelchair to private vehicle by NT.

## 2011-02-03 NOTE — ED Notes (Signed)
Rates the left ankle pain a 10 prior to meds.

## 2011-02-03 NOTE — Preoperative (Signed)
Beta Blockers   Reason not to administer Beta Blockers:Not Applicable 

## 2011-02-03 NOTE — Anesthesia Postprocedure Evaluation (Signed)
  Anesthesia Post-op Note  Patient: Darin Brady  Procedure(s) Performed:  OPEN REDUCTION INTERNAL FIXATION (ORIF) ANKLE FRACTURE  Patient Location: PACU  Anesthesia Type: General  Level of Consciousness: awake and alert   Airway and Oxygen Therapy: Patient Spontanous Breathing  Post-op Pain: mild  Post-op Assessment: Post-op Vital signs reviewed, Patient's Cardiovascular Status Stable, Respiratory Function Stable, Patent Airway and No signs of Nausea or vomiting  Post-op Vital Signs: stable  Complications: No apparent anesthesia complications

## 2011-02-03 NOTE — Anesthesia Preprocedure Evaluation (Addendum)
Anesthesia Evaluation  Patient identified by MRN, date of birth, ID band Patient awake    Reviewed: Allergy & Precautions, H&P , NPO status , Patient's Chart, lab work & pertinent test results  Airway Mallampati: II TM Distance: >3 FB Neck ROM: full    Dental  (+) Missing and Dental Advisory Given,    Pulmonary neg pulmonary ROS,  clear to auscultation  Pulmonary exam normal       Cardiovascular Exercise Tolerance: Good neg cardio ROS regular Normal    Neuro/Psych Negative Neurological ROS  Negative Psych ROS   GI/Hepatic negative GI ROS, Neg liver ROS,   Endo/Other  Negative Endocrine ROS  Renal/GU negative Renal ROS  Genitourinary negative   Musculoskeletal   Abdominal Normal abdominal exam  (+)   Peds  Hematology negative hematology ROS (+)   Anesthesia Other Findings   Reproductive/Obstetrics negative OB ROS                          Anesthesia Physical Anesthesia Plan  ASA: I  Anesthesia Plan: General   Post-op Pain Management:    Induction: Intravenous  Airway Management Planned: Oral ETT  Additional Equipment:   Intra-op Plan:   Post-operative Plan:   Informed Consent: I have reviewed the patients History and Physical, chart, labs and discussed the procedure including the risks, benefits and alternatives for the proposed anesthesia with the patient or authorized representative who has indicated his/her understanding and acceptance.   Dental Advisory Given  Plan Discussed with: CRNA and Surgeon  Anesthesia Plan Comments:       Anesthesia Quick Evaluation

## 2011-02-03 NOTE — ED Notes (Signed)
Returned from x-ray and resting on carrier with left leg elevated on pillow---Reports decrease in pain andnow rates his pain a 7 on 1-10 scale.

## 2011-02-03 NOTE — Transfer of Care (Signed)
Immediate Anesthesia Transfer of Care Note  Patient: Darin Brady  Procedure(s) Performed:  OPEN REDUCTION INTERNAL FIXATION (ORIF) ANKLE FRACTURE  Patient Location: PACU  Anesthesia Type: General  Level of Consciousness: awake, alert  and oriented  Airway & Oxygen Therapy: Patient Spontanous Breathing and Patient connected to face mask oxygen  Post-op Assessment: Report given to PACU RN and Post -op Vital signs reviewed and stable  Post vital signs: Reviewed and stable  Complications: No apparent anesthesia complications

## 2011-02-03 NOTE — Brief Op Note (Signed)
02/03/2011  12:29 PM  PATIENT:  Darin Brady  55 y.o. male  PRE-OPERATIVE DIAGNOSIS:  left ankle fracture  POST-OPERATIVE DIAGNOSIS:  left ankle fracture  PROCEDURE:  Procedure(s): OPEN REDUCTION INTERNAL FIXATION (ORIF) ANKLE FRACTURE  SURGEON:  Surgeon(s): Thera Flake., MD  PHYSICIAN ASSISTANT:   ASSISTANTS: none   ANESTHESIA:   general  EBL:  Total I/O In: 1500 [I.V.:1500] Out: 25 [Blood:25]  BLOOD ADMINISTERED:none  DRAINS: none   LOCAL MEDICATIONS USED:  MARCAINE 10CC  SPECIMEN:  No Specimen  DISPOSITION OF SPECIMEN:  N/A  COUNTS:  YES  TOURNIQUET:   Total Tourniquet Time Documented: Thigh (Left) - 58 minutes  DICTATION: .Other Dictation: Dictation Number   PLAN OF CARE: Discharge to home after PACU  PATIENT DISPOSITION:  PACU - hemodynamically stable.   Delay start of Pharmacological VTE agent (>24hrs) due to surgical blood loss or risk of bleeding:  {YES/NO/NOT APPLICABLE:20182

## 2011-02-03 NOTE — Op Note (Signed)
dictated

## 2011-02-04 NOTE — Op Note (Signed)
NAME:  Darin Brady, Darin Brady             ACCOUNT NO.:  [id number]  MEDICAL RECORD NO.:  [id number]  LOCATION:  WOTF                         FACILITY:  [facility]  PHYSICIAN:  Dyke Brackett, M.D.    DATE OF BIRTH:  December 18, 1955  DATE OF PROCEDURE:  02/03/2011 DATE OF DISCHARGE:                              OPERATIVE REPORT   INDICATIONS:  A 56 year old status post slip and fall, presented to ER with mildly displaced Weber B ankle fracture with disruption of his deltoid small medial malleolus fracture, syndesmotic disruption thought to be amenable to outpatient versus overnight hospitalization.  PREOPERATIVE DIAGNOSIS:  Displaced lateral ankle, lateral malleolus fracture with one disruption of syndesmosis all for the left ankle.  POSTOPERATIVE DIAGNOSIS:  Displaced lateral ankle, lateral malleolus fracture with one disruption of syndesmosis all for the left ankle.  OPERATION:  Open reduction, internal fixation, left ankle fracture with fixation syndesmosis (DePuy 1/3 tubular plate, 8 hole, 3.5 mm cortical screws with 4.0 cancellous screws).  SURGEON:  Dyke Brackett, MD  ANESTHETIC:  General with10 mL local supplementation.  DESCRIPTION OF PROCEDURE:  Sterile prep and drape, exsanguination of leg, inflation of tourniquet 350.  Straight skin incision was made over the fibula.  We identified and reduced the short oblique fracture.  Pre- bent __________  We obtained pretty good fixation with 3 screws above the fracture site, 2 screws below with intervening syndesmotic screws. We fixed the fracture initially, checked it on several cases with C-arm to see that the screws were well within the bone and not invading the joint.  After fixation of the fracture, we placed two 4-0 solid cancellous screws to fix the syndesmosis.  AP, lateral, and mortise views of the ankle confirmed excellent reduction.  Wound was irrigated, closed with #1 Vicryl, 2-0 Vicryl, and skin clips. Marcaine without epinephrine  from the skin.  Light pressure sterile dressing applied.  Posterior plaster splint.     Dyke Brackett, M.D.     WDC/MEDQ  D:  02/03/2011  T:  02/03/2011  Job:  [id number]

## 2011-02-04 NOTE — Discharge Summary (Signed)
PATIENT ID:      Darin Brady  MRN:     [id number] DOB/AGE:    April 14, 1955 / 55 y.o.     DISCHARGE SUMMARY  ADMISSION DATE:    02-09-2011 DISCHARGE DATE:   2011-02-09  ADMISSION DIAGNOSIS: left ankle fracture  DISCHARGE DIAGNOSIS:  left ankle fracture    ADDITIONAL DIAGNOSIS: Active Problems:  * No active hospital problems. *   Past Medical History  Diagnosis Date  . Hypertension     Previous anti-hypertensive meds but ran out  . Hyperthyroidism     PROCEDURE: Procedure(s): OPEN REDUCTION INTERNAL FIXATION (ORIF) ANKLE FRACTURE on 2011-02-09  CONSULTS:     HISTORY:  See H&P in chart  HOSPITAL COURSE:  Darin Brady is a 55 y.o. admitted on 2011-02-09 and found to have a diagnosis of left ankle fracture.  After appropriate laboratory studies were obtained  they were taken to the operating room on 02-09-11 and underwent Procedure(s): OPEN REDUCTION INTERNAL FIXATION (ORIF) ANKLE FRACTURE.   They were given perioperative antibiotics:  Anti-infectives    None    .    The remainder of the hospital course was dedicated to pain control and crutch training.   The patient was discharged on 1 Day Post-Op (day of surgery) in stable condition.   DIAGNOSTIC STUDIES: Recent vital signs: Temp:  [98.2 F (36.8 C)-99.1 F (37.3 C)] 99.1 F (37.3 C) February 09, 2023 1540) Pulse Rate:  [72-102] 78  2023/02/09 1540) Resp:  [12-20] 18  02/09/23 1540) BP: (142-164)/(87-98) 142/87 mmHg 2023/02/09 1540) SpO2:  [96 %-100 %] 100 % Feb 09, 2023 1540) Weight:  [67.132 kg (148 lb)] 148 lb (67.132 kg) 02/09/23 1337)    Recent laboratory studies:  [facility] February 09, 2011 0934 02-09-11 0915  WBC -- 7.8  HGB 14.3 12.3*  HCT 42.0 36.9*  PLT -- 230    Basename 02-09-2011 0934  NA 141  K 4.2  CL 105  CO2 --  BUN 14  CREATININE 0.90  GLUCOSE 91  CALCIUM --   No results found for this basename: INR, PROTIME     Recent Radiographic Studies :  Dg Chest 2 View  Feb 09, 2011  *RADIOLOGY REPORT*  Clinical Data:  55 year old male - preoperative respiratory examination for ankle surgery.  CHEST - 2 VIEW  Comparison: None  Findings: Mild cardiomegaly is noted. The lungs are clear. There is no evidence of focal airspace disease, pulmonary edema, pulmonary nodule/mass, pleural effusion, or pneumothorax. No acute bony abnormalities are identified.  IMPRESSION: Mild cardiomegaly without evidence of active cardiopulmonary disease.  Original Report Authenticated By: Rosendo Gros, M.D.   Dg Ankle Complete Left  09-Feb-2011  *RADIOLOGY REPORT*  Clinical Data: Left ankle pain  LEFT ANKLE COMPLETE - 3+ VIEW  Comparison: None.  Findings: There is a trimalleolar fracture.  There is mild lateral displacement of the ankle mortise.  Mild diffuse soft tissue swelling identified.  IMPRESSION:  1.  Findings compatible with mildly displaced trimalleolar fracture.  Original Report Authenticated By: Rosealee Albee, M.D.    DISCHARGE INSTRUCTIONS: Discharge Orders    Future Orders Please Complete By Expires   Discharge patient      Comments:   Once pt stable in PACU ok to d/c home.       DISCHARGE MEDICATIONS:   Discharge Medication List as of 02-09-11  6:36 PM    START taking these medications   Details  oxyCODONE (ROXICODONE) 5 MG immediate release tablet 1-2 tabs po q4-6hrs prn pain, max 8 tabs daily, Print  CONTINUE these medications which have NOT CHANGED   Details  acetaminophen (TYLENOL) 500 MG tablet Take 1,000 mg by mouth every 6 (six) hours as needed.  , Until Discontinued, Historical Med      STOP taking these medications     Menthol, Topical Analgesic, (BIOFREEZE) 4 % GEL         FOLLOW UP VISIT:   Follow-up Information    Follow up with CAFFREY JR,W D, MD. Call in 2 weeks. (call to make appt for 10-14 days from today)    Contact information:   [facility], [facility] [address] [place] [address] [phone]          DISPOSITION:   Home  Home or Self Care  CONDITION:  Stable   Margart Sickles 02/04/2011, 9:38 AM

## 2011-02-06 ENCOUNTER — Encounter (HOSPITAL_COMMUNITY): Payer: Self-pay | Admitting: Orthopedic Surgery

## 2013-06-28 ENCOUNTER — Emergency Department (HOSPITAL_COMMUNITY): Payer: Self-pay

## 2013-06-28 ENCOUNTER — Emergency Department (HOSPITAL_COMMUNITY)
Admission: EM | Admit: 2013-06-28 | Discharge: 2013-06-29 | Disposition: A | Discharge status: Home / Self Care | Payer: Self-pay | Attending: Emergency Medicine | Admitting: Emergency Medicine

## 2013-06-28 ENCOUNTER — Encounter (HOSPITAL_COMMUNITY): Payer: Self-pay | Admitting: Emergency Medicine

## 2013-06-28 DIAGNOSIS — F10929 Alcohol use, unspecified with intoxication, unspecified: Secondary | ICD-10-CM

## 2013-06-28 DIAGNOSIS — Z862 Personal history of diseases of the blood and blood-forming organs and certain disorders involving the immune mechanism: Secondary | ICD-10-CM | POA: Insufficient documentation

## 2013-06-28 DIAGNOSIS — R55 Syncope and collapse: Secondary | ICD-10-CM

## 2013-06-28 DIAGNOSIS — I10 Essential (primary) hypertension: Secondary | ICD-10-CM | POA: Insufficient documentation

## 2013-06-28 DIAGNOSIS — Z8639 Personal history of other endocrine, nutritional and metabolic disease: Secondary | ICD-10-CM | POA: Insufficient documentation

## 2013-06-28 DIAGNOSIS — F17200 Nicotine dependence, unspecified, uncomplicated: Secondary | ICD-10-CM | POA: Insufficient documentation

## 2013-06-28 DIAGNOSIS — F1010 Alcohol abuse, uncomplicated: Secondary | ICD-10-CM | POA: Insufficient documentation

## 2013-06-28 LAB — CBC WITH DIFFERENTIAL/PLATELET
Basophils Absolute: 0 10*3/uL (ref 0.0–0.1)
Basophils Relative: 1 % (ref 0–1)
Eosinophils Absolute: 0.1 10*3/uL (ref 0.0–0.7)
Eosinophils Relative: 3 % (ref 0–5)
HEMATOCRIT: 36.9 % — AB (ref 39.0–52.0)
HEMOGLOBIN: 13 g/dL (ref 13.0–17.0)
LYMPHS PCT: 45 % (ref 12–46)
Lymphs Abs: 1.7 10*3/uL (ref 0.7–4.0)
MCH: 33.3 pg (ref 26.0–34.0)
MCHC: 35.2 g/dL (ref 30.0–36.0)
MCV: 94.6 fL (ref 78.0–100.0)
MONO ABS: 0.4 10*3/uL (ref 0.1–1.0)
MONOS PCT: 11 % (ref 3–12)
Neutro Abs: 1.5 10*3/uL — ABNORMAL LOW (ref 1.7–7.7)
Neutrophils Relative %: 40 % — ABNORMAL LOW (ref 43–77)
Platelets: 233 10*3/uL (ref 150–400)
RBC: 3.9 MIL/uL — AB (ref 4.22–5.81)
RDW: 14.1 % (ref 11.5–15.5)
WBC: 3.8 10*3/uL — AB (ref 4.0–10.5)

## 2013-06-28 LAB — I-STAT TROPONIN, ED: Troponin i, poc: 0.02 ng/mL (ref 0.00–0.08)

## 2013-06-28 LAB — COMPREHENSIVE METABOLIC PANEL
ALT: 17 U/L (ref 0–53)
AST: 31 U/L (ref 0–37)
Albumin: 3.6 g/dL (ref 3.5–5.2)
Alkaline Phosphatase: 75 U/L (ref 39–117)
BUN: 11 mg/dL (ref 6–23)
CO2: 19 mEq/L (ref 19–32)
Calcium: 9 mg/dL (ref 8.4–10.5)
Chloride: 106 mEq/L (ref 96–112)
Creatinine, Ser: 0.97 mg/dL (ref 0.50–1.35)
GFR calc Af Amer: >90 mL/min (ref >90)
GFR calc non Af Amer: 89 mL/min — ABNORMAL LOW (ref >90)
GLUCOSE: 91 mg/dL (ref 70–99)
Potassium: 4.2 mEq/L (ref 3.7–5.3)
SODIUM: 142 meq/L (ref 137–147)
Total Bilirubin: 0.3 mg/dL (ref 0.3–1.2)
Total Protein: 7.5 g/dL (ref 6.0–8.3)

## 2013-06-28 LAB — URINALYSIS, ROUTINE W REFLEX MICROSCOPIC
Bilirubin Urine: NEGATIVE
Glucose, UA: NEGATIVE mg/dL
HGB URINE DIPSTICK: NEGATIVE
Ketones, ur: NEGATIVE mg/dL
Leukocytes, UA: NEGATIVE
Nitrite: NEGATIVE
PH: 5 (ref 5.0–8.0)
Protein, ur: NEGATIVE mg/dL
SPECIFIC GRAVITY, URINE: 1.014 (ref 1.005–1.030)
UROBILINOGEN UA: 0.2 mg/dL (ref 0.0–1.0)

## 2013-06-28 LAB — ETHANOL: ALCOHOL ETHYL (B): 218 mg/dL — AB (ref 0–11)

## 2013-06-28 LAB — CBG MONITORING, ED: Glucose-Capillary: 90 mg/dL (ref 70–99)

## 2013-06-28 LAB — AMMONIA: Ammonia: 32 umol/L (ref 11–60)

## 2013-06-28 IMAGING — CT CT HEAD W/O CM
2 series · 16 of 30 positions shown, 18 images · non-contrast
Comparison: None.

CLINICAL DATA: Loss of consciousness.  Expressive  aphasia.

EXAM:
CT HEAD WITHOUT CONTRAST
TECHNIQUE: Contiguous axial images were obtained from the base of the skull
through the vertex without intravenous contrast.

[Series 201: head w/o, idose (1) · axial · non-contrast · 0.49mm/px · z∈[+115,+235]mm · 8 of 32 slices shown, 10 images]
[im 4/32  brain]
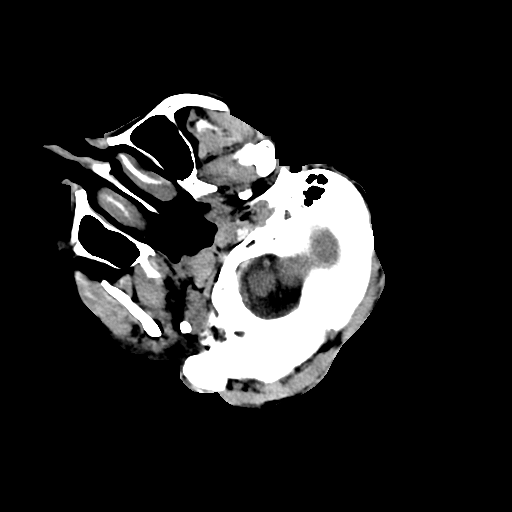
[im 4/32  bone]
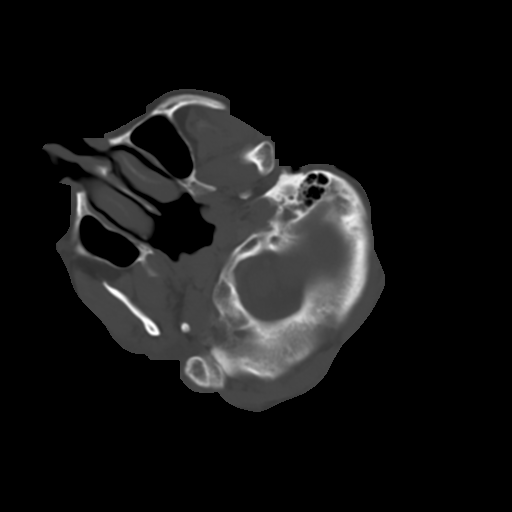
[im 7/32  brain]
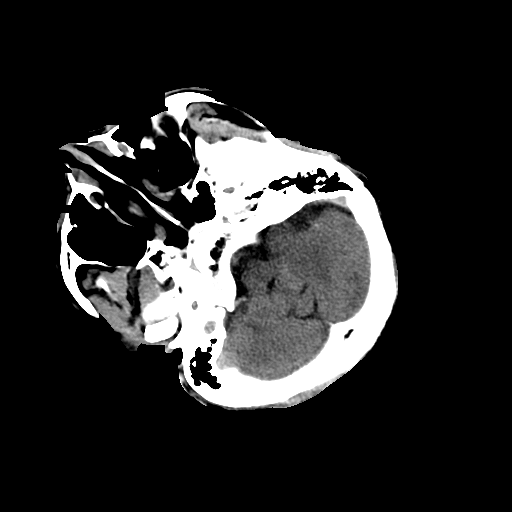
[im 11/32  brain]
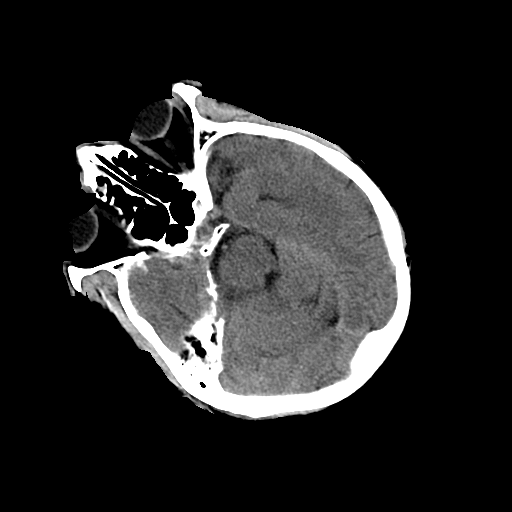
[im 14/32  brain]
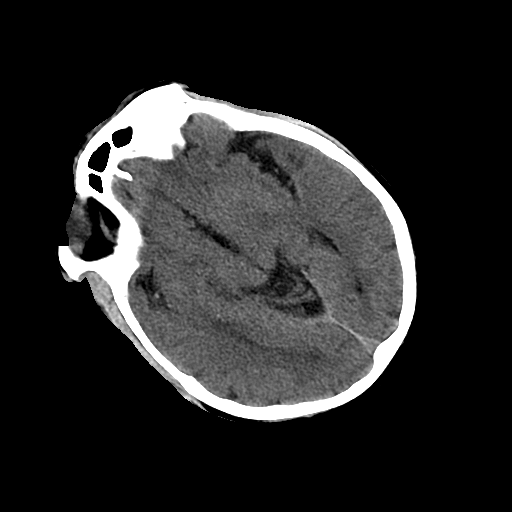
[im 18/32  brain]
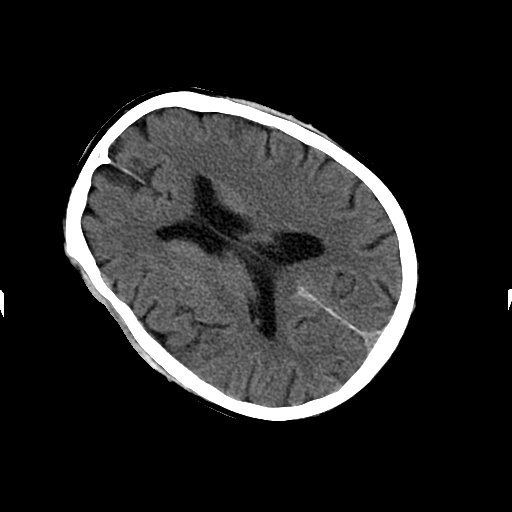
[im 18/32  bone]
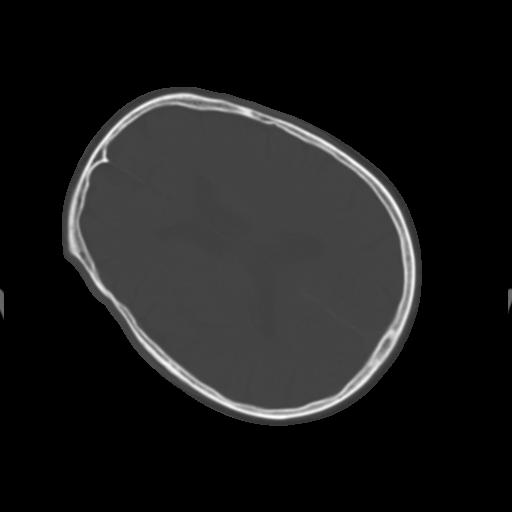
[im 21/32  brain]
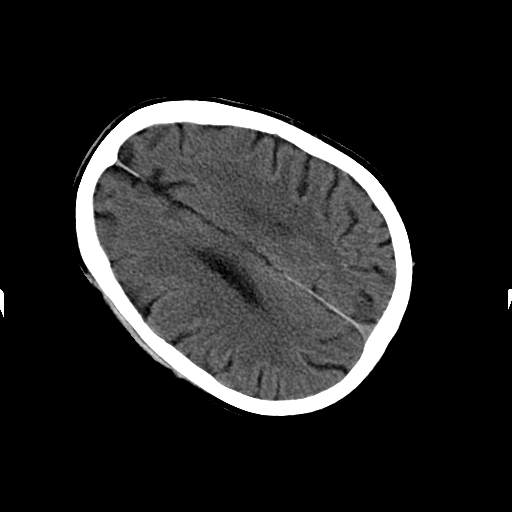
[im 25/32  brain]
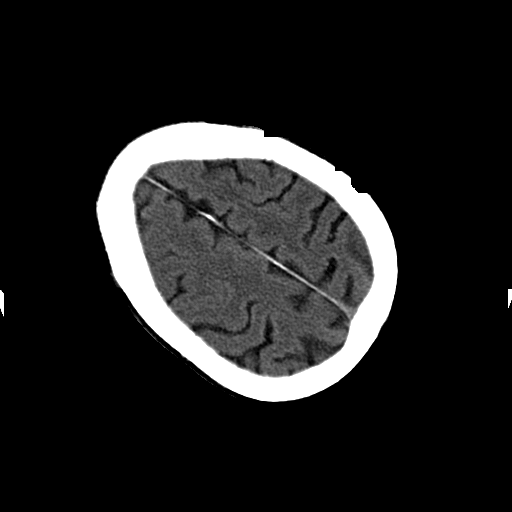
[im 28/32  brain]
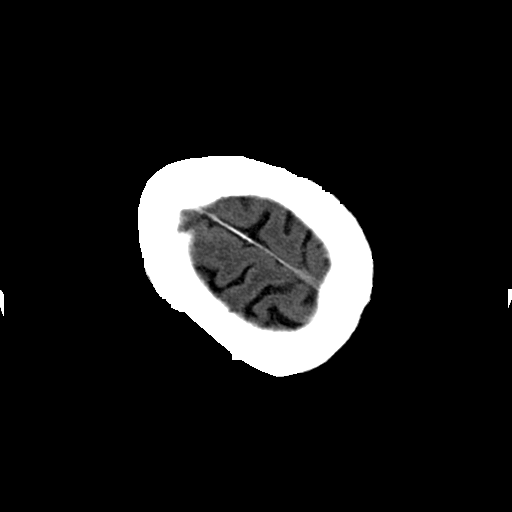

[Series 202: head w/o bone, idose (1) · axial · non-contrast · 0.49mm/px · z∈[+114,+239]mm · 8 of 64 slices shown]
[im 7/64  bone]
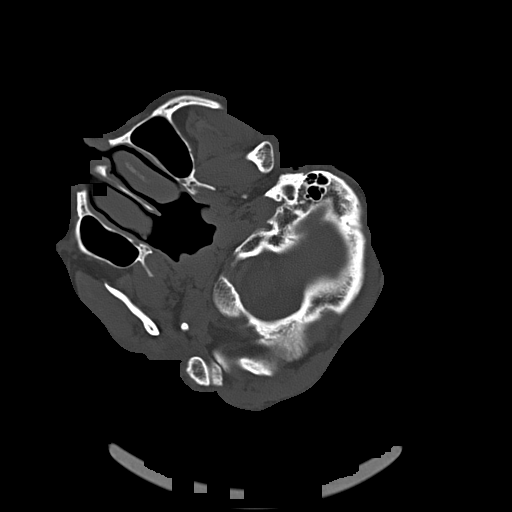
[im 14/64  bone]
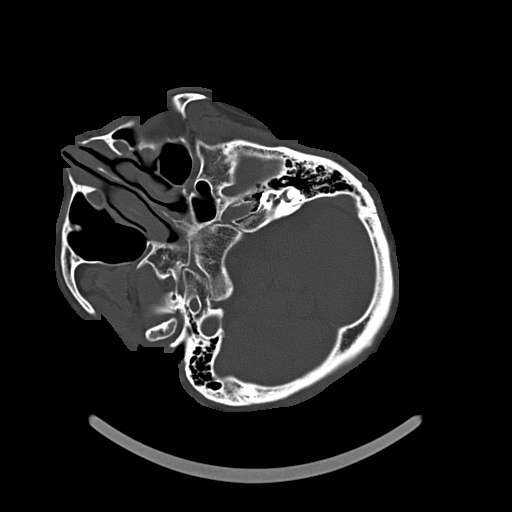
[im 20/64  bone]
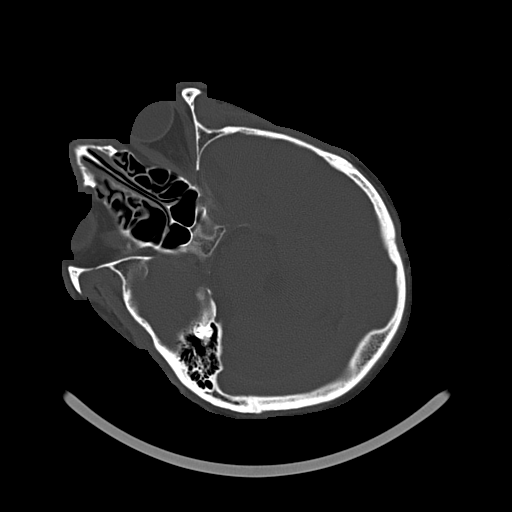
[im 27/64  bone]
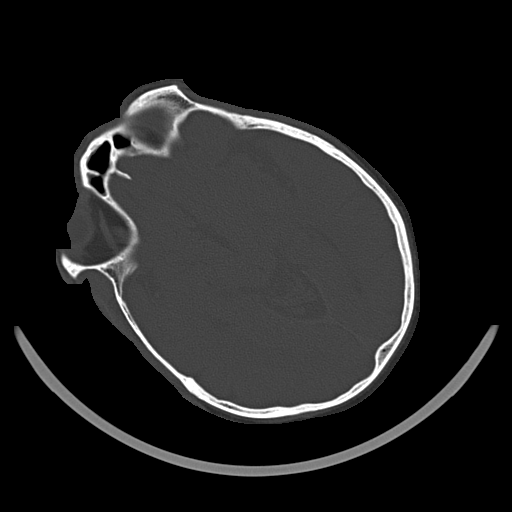
[im 37/64  bone]
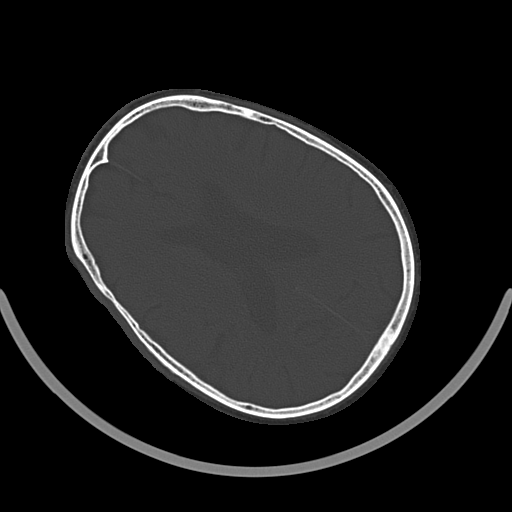
[im 44/64  bone]
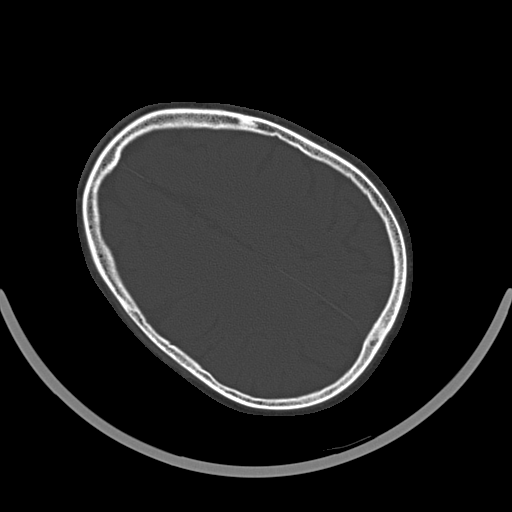
[im 50/64  bone]
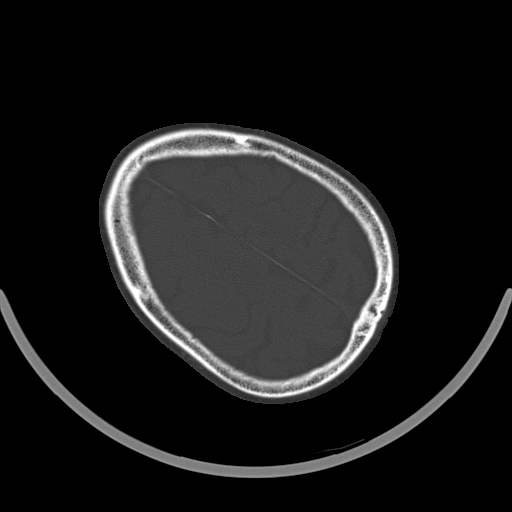
[im 57/64  bone]
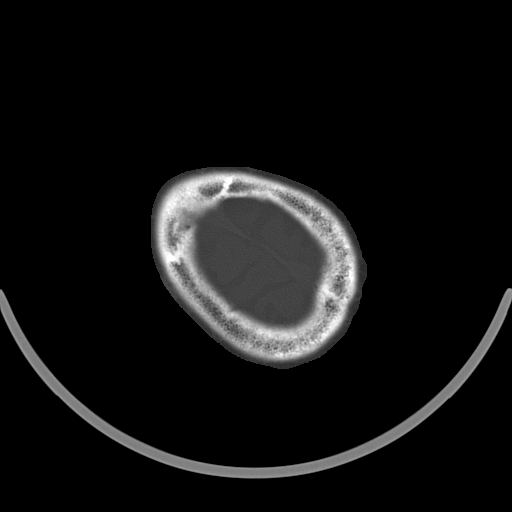

[16 of 30 positions shown; findings below may reference images not displayed]

FINDINGS: Mild cerebral atrophy. No mass effect or midline shift. No abnormal
extra-axial fluid collections. Gray-white matter junctions are
distinct. Basal cisterns are not effaced. No evidence of acute
intracranial hemorrhage. No depressed skull fractures. Deformity of
the right medial orbital wall may represent old fracture deformity
or congenital change. Mucosal thickening in the paranasal sinuses.
No acute air-fluid levels.
IMPRESSION: No acute intracranial abnormalities.

## 2013-06-28 IMAGING — CR DG CHEST 2V
2 series · 2 of 2 positions shown · non-contrast
Comparison: DG CHEST 2 VIEW dated 02/03/2011

CLINICAL DATA: Loss of consciousness

EXAM:
CHEST - 2 VIEW

[w chest lat]
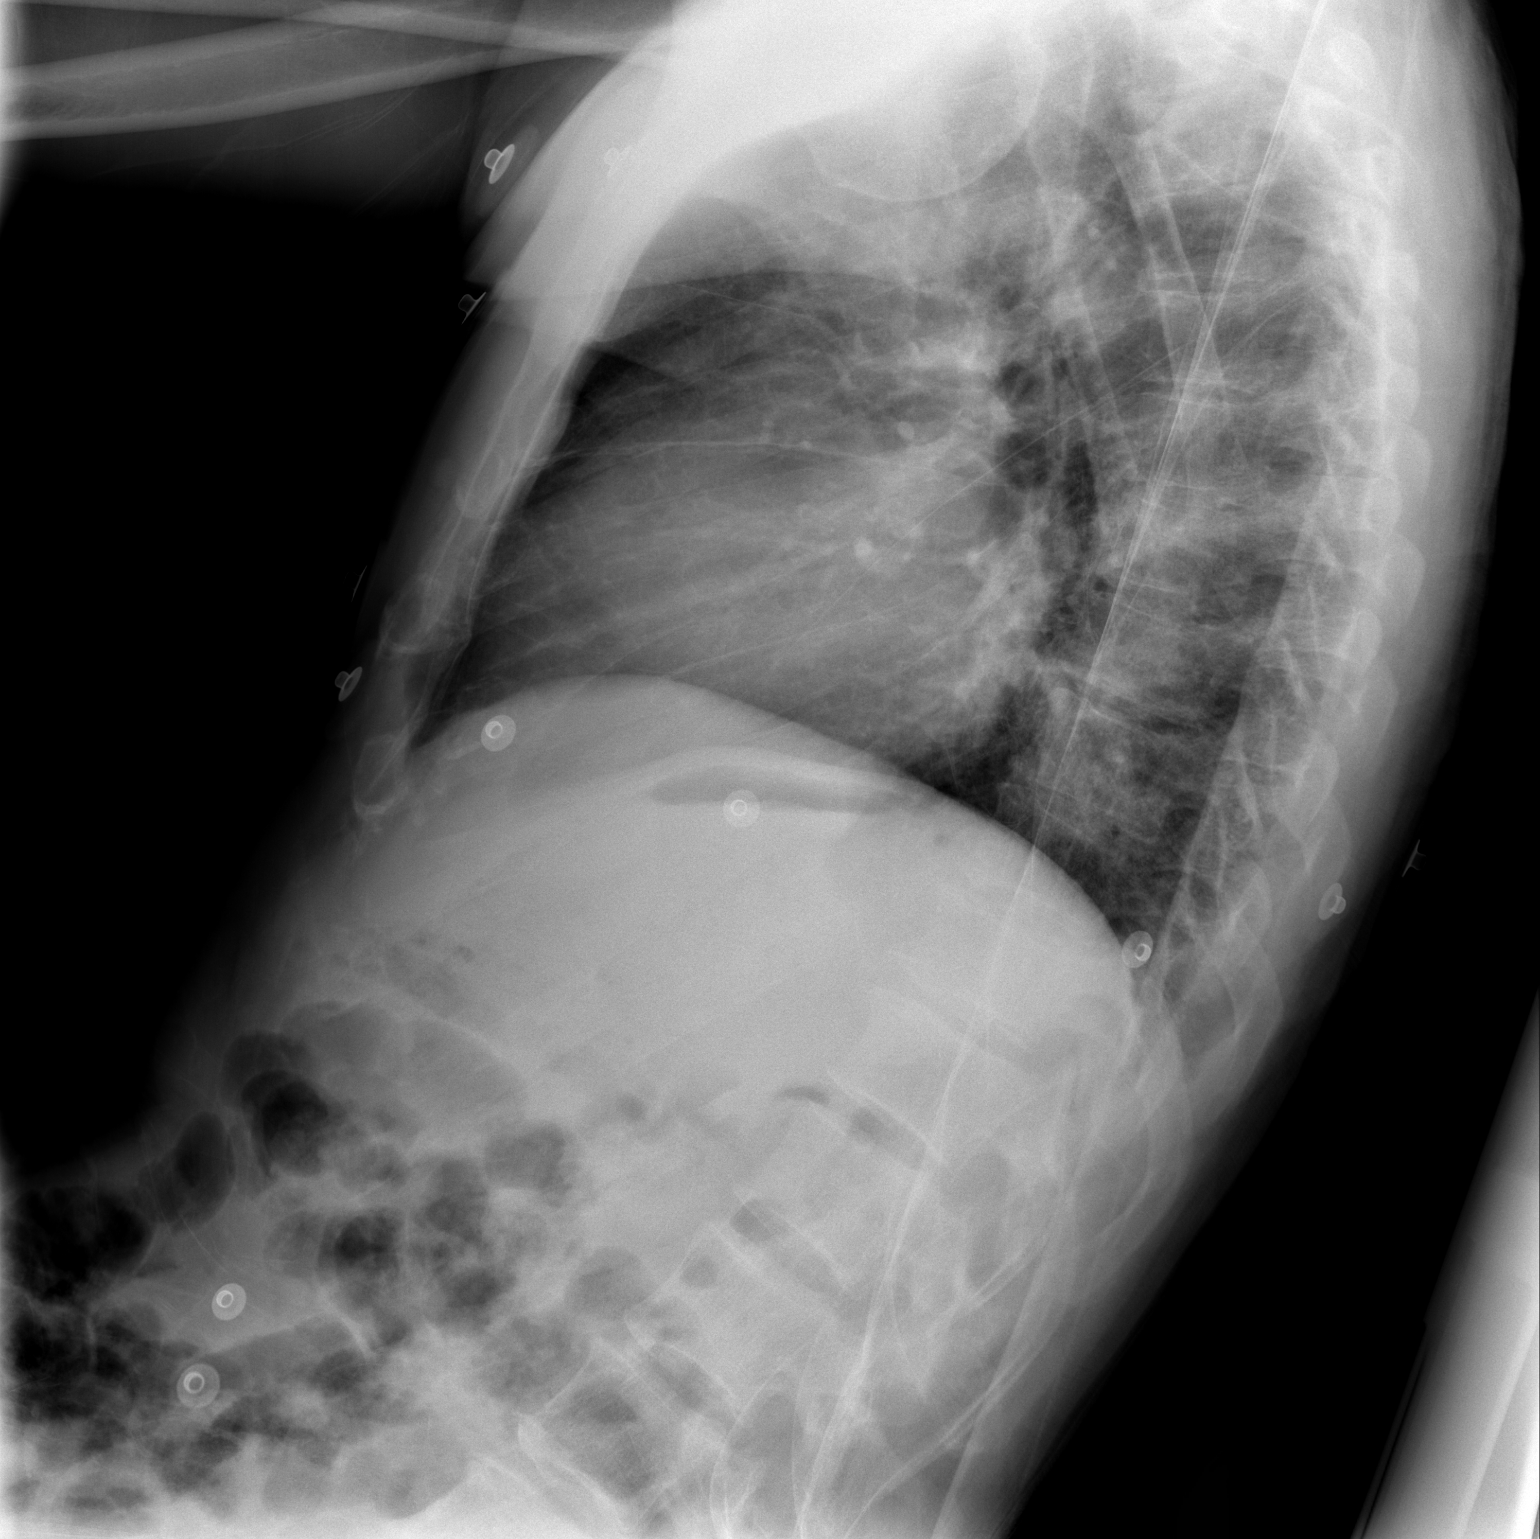

[view not recorded]
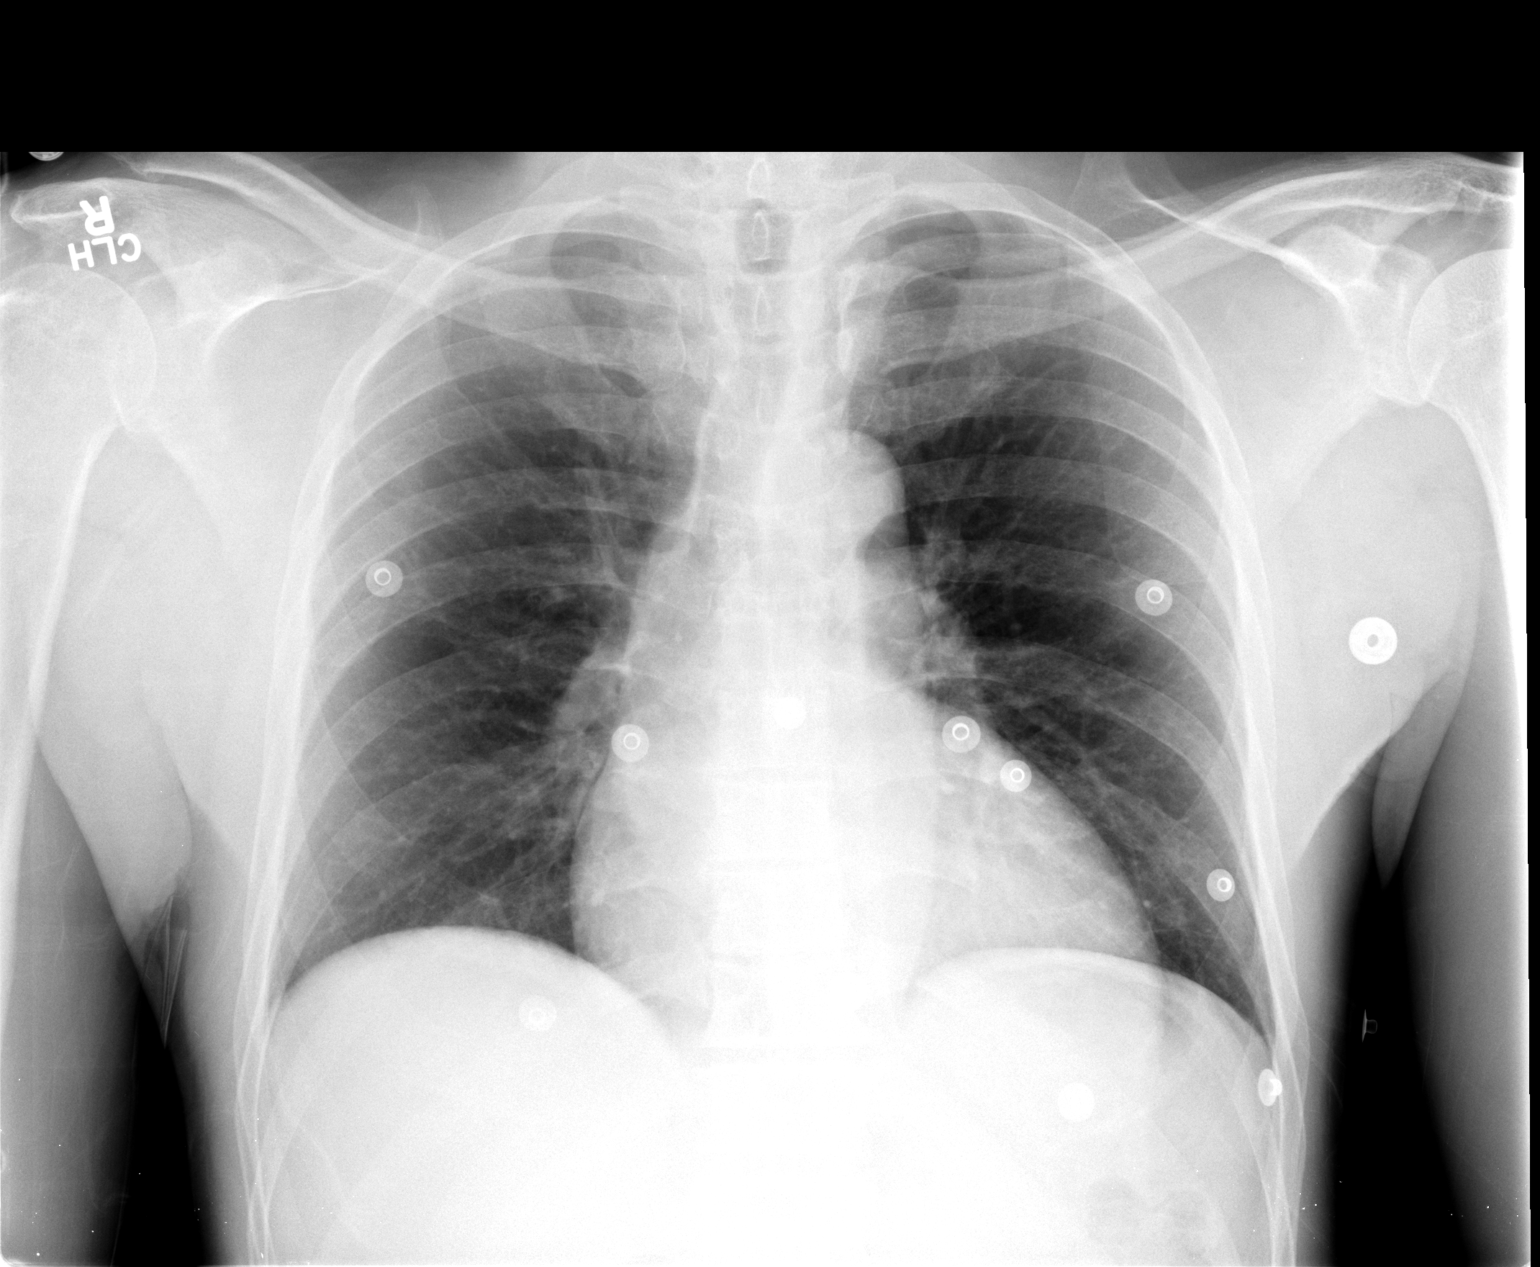

[2 of 2 positions shown; findings below may reference images not displayed]

FINDINGS: Mild cardiomegaly. Clear lungs. Normal vascularity. No pleural
effusion. No pneumothorax.
IMPRESSION: Cardiomegaly without decompensation.

## 2013-06-28 NOTE — ED Provider Notes (Signed)
CSN: [id number]     Arrival date & time 06/28/13  2132 History   First MD Initiated Contact with Patient 06/28/13 2139     Chief Complaint  Patient presents with  . Loss of Consciousness     (Consider location/radiation/quality/duration/timing/severity/associated sxs/prior Treatment) Patient is a 58 y.o. male presenting with syncope. The history is provided by a relative and the EMS personnel.  Loss of Consciousness  This is a 58 year old male with past medical history significant for hypertension, hypothyroidism, presenting to the ED for syncope. History is obtained from family present at bedtime. Family states that after eating dinner, patient stood up from the table and clutched his chest. They assisted him to the living room, when his legs went limp.  He was assisted to the ground by family members, no head trauma. Family estimates he was in and out of consciousness for approximately 10 minutes. No tremors or seizure activity.  No urinary or bowel incontinence.   Pt has some EtOH on board, unsure of street or prescription drugs.  Patient has no prior cardiac history. Positive family history of early CAD-- Brother died at age 60 of massive MI. On EMS arrival, patient would open eyes to sternal rub and verbal stimuli.  On arrival to our ED, pt is speaking in short sentences, denies pain.  Past Medical History  Diagnosis Date  . Hypertension     Previous anti-hypertensive meds but ran out  . Hyperthyroidism    Past Surgical History  Procedure Laterality Date  . Clavicle surgery      15 years ago  . Stomach surgery      stop bleeding from a trauma  . Orif ankle fracture  02/03/2011    Procedure: OPEN REDUCTION INTERNAL FIXATION (ORIF) ANKLE FRACTURE;  Surgeon: Thera FlakeW D Caffrey Jr., MD;  Location: [facility];  Service: Orthopedics;  Laterality: Left;   No family history on file. History  Substance Use Topics  . Smoking status: Current Every Day Smoker -- 0.50 packs/day  . Smokeless tobacco:  Not on file  . Alcohol Use: Yes     Comment: occasionally    Review of Systems  Cardiovascular: Positive for syncope.  All other systems reviewed and are negative.     Allergies  Review of patient's allergies indicates no known allergies.  Home Medications   Prior to Admission medications   Medication Sig Start Date End Date Taking? Authorizing Provider  acetaminophen (TYLENOL) 500 MG tablet Take 1,000 mg by mouth every 6 (six) hours as needed.      Historical Provider, MD  oxyCODONE (ROXICODONE) 5 MG immediate release tablet 1-2 tabs po q4-6hrs prn pain, max 8 tabs daily 02/03/11   Joshua Chadwell, PA-C   BP 129/78  Pulse 80  Temp(Src) 98 F (36.7 C)  Resp 17  SpO2 95%  Physical Exam  Nursing note and vitals reviewed. Constitutional: He appears well-developed and well-nourished. No distress.  HENT:  Head: Normocephalic and atraumatic.  Mouth/Throat: Oropharynx is clear and moist.  No visible head trauma  Eyes: Conjunctivae and EOM are normal. Pupils are equal, round, and reactive to light.  Neck: Normal range of motion.  Cardiovascular: Normal rate, regular rhythm and normal heart sounds.   Pulmonary/Chest: Effort normal and breath sounds normal. No respiratory distress. He has no wheezes.  Abdominal: Soft. Bowel sounds are normal.  Musculoskeletal: Normal range of motion.  Neurological: He is alert. He displays no tremor. He displays no seizure activity.  Awake, alert; turns head and eyes  track to verbal and tactile stimuli; shaking head "yes" or "no" to some questions; following some commands when prompted; spontaneously moving all 4 extremities; shakes head "yes" when questioned about sensation of all 4 extremities  Skin: Skin is warm and dry. He is not diaphoretic.  Psychiatric: He has a normal mood and affect.  tearful    ED Course  Procedures (including critical care time) Labs Review Labs Reviewed  CBC WITH DIFFERENTIAL - Abnormal; Notable for the  following:    WBC 3.8 (*)    RBC 3.90 (*)    HCT 36.9 (*)    Neutrophils Relative % 40 (*)    Neutro Abs 1.5 (*)    All other components within normal limits  COMPREHENSIVE METABOLIC PANEL - Abnormal; Notable for the following:    GFR calc non Af Amer 89 (*)    All other components within normal limits  ETHANOL - Abnormal; Notable for the following:    Alcohol, Ethyl (B) 218 (*)    All other components within normal limits  AMMONIA  URINALYSIS, ROUTINE W REFLEX MICROSCOPIC  URINE RAPID DRUG SCREEN (HOSP PERFORMED)  POCT CBG (FASTING - GLUCOSE)-MANUAL ENTRY  I-STAT TROPOININ, ED  CBG MONITORING, ED    Imaging Review No results found.   EKG Interpretation   Date/Time:  Sunday June 28 2013 21:40:18 EDT Ventricular Rate:  81 PR Interval:  158 QRS Duration: 110 QT Interval:  414 QTC Calculation: 481 R Axis:   43 Text Interpretation:  Age not entered, assumed to be  57 years old for  purpose of ECG interpretation Sinus rhythm Left ventricular hypertrophy  Abnormal T, consider ischemia, lateral leads Sinus rhythm Left ventricular  hypertrophy T wave abnormality Abnormal ekg Confirmed by Gerhard Munch   MD [phone]) on 06/28/2013 9:52:54 PM      MDM   Final diagnoses:  Syncope  Alcohol intoxication   On initial evaluation, patient is awake and alert.  He will track his eyes to verbal and tactile stimuli, but does not provide any verbal responses to myself but he did talk with nurse on arrival to ED. He will occasionally shake his head yes or no to certain questions.  He follows limited commands and became tearful and asking why he could not/would not speak.  Pt does have EtOH on board.  Will obtain labs, CXR, head CT for further eval.  EKG NSR without ischemic change. Troponin is negative. Labs as above, ethanol 218. CT head negative for acute findings. Chest x-ray clear.  On repeat evaluation, pt is AAOx3, speaking in full complete sentences, following all commands, and has  even requested refill of his BP meds.  Neuro exam is without focal deficits.  He remains without current complaints.    Doubt ACS, PE, dissection, or other acute cardiac event at this time.  Pt will be discharged home with OP cardiology follow-up, likely holter monitor.  Pt never did disclose how much he drank prior to arrival, i have advised him to limited EtOH use.  Discussed plan with pt and family at bedside, they acknowledged understanding and agreed with plan of care.  Return precautions given for new or worsening symptoms.  Discussed with Dr. Jeraldine Loots who personally evaluated pt and agrees with plan of care.  Garlon Hatchet, PA-C 06/29/13 [phone]

## 2013-06-28 NOTE — ED Notes (Signed)
Mom and Sister at bedside.  States after pt ate he got up from table went over to stool and was holding chest, as they moved him to another room pt started falling forward and sister lowered pt to floor.  Sister states pt was in and out of consciousness for about 10 minutes.

## 2013-06-28 NOTE — ED Notes (Signed)
To room via EMS.  Family reported to EMS that lowered self to floor and lost consciousness.  Crew on scene first told EMS that pt was A&O and following commands, when EMS got there pt was not communicating but did open eyes to sternal rub and then would only just look at EMS.  Pt arouses slowly to voice and responds to painful stimuli.  After few minutes pt opens eyes, talking and MAE.  No c/o pain.

## 2013-06-28 NOTE — ED Notes (Signed)
Pt given urinal to void

## 2013-06-29 LAB — RAPID URINE DRUG SCREEN, HOSP PERFORMED
Amphetamines: NOT DETECTED
BARBITURATES: NOT DETECTED
BENZODIAZEPINES: NOT DETECTED
COCAINE: NOT DETECTED
Opiates: NOT DETECTED
TETRAHYDROCANNABINOL: NOT DETECTED

## 2013-06-29 MED ORDER — LISINOPRIL-HYDROCHLOROTHIAZIDE 10-12.5 MG PO TABS: 1.0000 | ORAL_TABLET | Freq: Every day | ORAL | Status: DC

## 2013-06-29 NOTE — ED Notes (Signed)
PA at bedside.

## 2013-06-29 NOTE — Discharge Instructions (Signed)
I have refilled your blood pressure medications for you.  Please start taking these as directed. Limit alcohol intake at home. Follow-up with [facility] cardiology for further evaluation-- call tomorrow morning and schedule appt. Return to the ED for new or worsening symptoms.

## 2013-06-29 NOTE — ED Notes (Signed)
Pt following commands and responding to questions asked at time of discharge, pt A&O X4. Pt ambulatory. Pt states he feels much better now.

## 2013-06-30 NOTE — ED Provider Notes (Signed)
  This was a shared visit with a mid-level provided (NP or PA).  Throughout the patient's course I was available for consultation/collaboration.  I saw the ECG (if appropriate), relevant labs and studies - I agree with the interpretation.  On my exam the patient was in no distress.  We had a lengthy conversation about this event, the absence of ongoing pain, and given the absence of distress, the patient's intoxicated state, there is low suspicion for atypical ACS were the dysrhythmia as causative etiology. Given the patient's medication noncompliance I counseled him on the need to take all medication as directed, follow up with his physician.      Gerhard Munch, MD 06/30/13 1225

## 2013-07-08 ENCOUNTER — Ambulatory Visit: Payer: Self-pay | Admitting: Cardiology

## 2013-07-08 ENCOUNTER — Encounter: Payer: Self-pay | Admitting: Cardiovascular Disease

## 2013-07-08 ENCOUNTER — Ambulatory Visit (INDEPENDENT_AMBULATORY_CARE_PROVIDER_SITE_OTHER): Payer: Self-pay | Admitting: Cardiovascular Disease

## 2013-07-08 VITALS — BP 130/91 | HR 88 | Ht 69.0 in | Wt 137.0 lb

## 2013-07-08 DIAGNOSIS — Z8249 Family history of ischemic heart disease and other diseases of the circulatory system: Secondary | ICD-10-CM | POA: Insufficient documentation

## 2013-07-08 DIAGNOSIS — R55 Syncope and collapse: Secondary | ICD-10-CM

## 2013-07-08 DIAGNOSIS — F1010 Alcohol abuse, uncomplicated: Secondary | ICD-10-CM

## 2013-07-08 DIAGNOSIS — I10 Essential (primary) hypertension: Secondary | ICD-10-CM | POA: Insufficient documentation

## 2013-07-08 HISTORY — DX: Syncope and collapse: R55

## 2013-07-08 NOTE — Assessment & Plan Note (Signed)
The patient states that he drinks 2-3 beers 3-4 times a week. His blood alcohol level is 218 on 06/24/13 at the time of admission for syncope.

## 2013-07-08 NOTE — Patient Instructions (Addendum)
Please call us when you are ready to proceed with the Echocardiogram and the Event Monitor.  Our office number is [phone].

## 2013-07-08 NOTE — Assessment & Plan Note (Signed)
Brother age 60 died of massive myocardial function

## 2013-07-08 NOTE — Progress Notes (Signed)
     07/08/2013 [name]   1955-05-11  [id number]  Primary Physician No PCP Per Patient Primary Cardiologist: Runell Gess MD Roseanne Reno   HPI:  Mr. Umana is a 58 year old thin appearing single African American male with no children referred for evaluation of syncope. He has not worked for 8 years. His risk factors include 40 pack years of tobacco abuse currently smoking one pack per day as well as 2 hypertension. He is a 63 year old brother died of a massive myocardial infarction. He drinks 2-3 beers a day 4 days a week. He's had episodes of syncope, one in March and one in April. There were sudden onset without seizure activity.   Current Outpatient Prescriptions  Medication Sig Dispense Refill  . lisinopril-hydrochlorothiazide (PRINZIDE,ZESTORETIC) 10-12.5 MG per tablet Take 1 tablet by mouth daily.  30 tablet  0   No current facility-administered medications for this visit.    No Known Allergies  History   Social History  . Marital Status: Single    Spouse Name: N/A    Number of Children: N/A  . Years of Education: N/A   Occupational History  . Not on file.   Social History Main Topics  . Smoking status: Current Every Day Smoker -- 0.50 packs/day  . Smokeless tobacco: Not on file  . Alcohol Use: Yes     Comment: occasionally  . Drug Use: No  . Sexual Activity: Yes   Other Topics Concern  . Not on file   Social History Narrative  . No narrative on file     Review of Systems: General: negative for chills, fever, night sweats or weight changes.  Cardiovascular: negative for chest pain, dyspnea on exertion, edema, orthopnea, palpitations, paroxysmal nocturnal dyspnea or shortness of breath Dermatological: negative for rash Respiratory: negative for cough or wheezing Urologic: negative for hematuria Abdominal: negative for nausea, vomiting, diarrhea, bright red blood per rectum, melena, or hematemesis Neurologic: negative for visual  changes, syncope, or dizziness All other systems reviewed and are otherwise negative except as noted above.    Blood pressure 130/91, pulse 88, height 5\' 9"  (1.753 m), weight 137 lb (62.143 kg).  General appearance: alert and no distress Neck: no adenopathy, no carotid bruit, no JVD, supple, symmetrical, trachea midline and thyroid not enlarged, symmetric, no tenderness/mass/nodules Lungs: clear to auscultation bilaterally Heart: regular rate and rhythm, S1, S2 normal, no murmur, click, rub or gallop Abdomen: soft, non-tender; bowel sounds normal; no masses,  no organomegaly Extremities: extremities normal, atraumatic, no cyanosis or edema  EKG not performed today  ASSESSMENT AND PLAN:   Alcohol abuse The patient states that he drinks 2-3 beers 3-4 times a week. His blood alcohol level is 218 on 06/24/13 at the time of admission for syncope.  Essential hypertension Controlled on current medications  Family history of heart disease Brother age 93 died of massive myocardial function  Syncope Patient had 2 episodes of syncope, one in March and one last month. In the emergency room he was found have a blood alcohol level of 218. The episode of sudden onset. There is no seizure activity. His exam is benign. I'm going to order to get good LV function and have him on the monitor I will see him back after that for further evaluation. I have counseled him about not driving for 6 months from his index event.      Runell Gess MD FACP,FACC,FAHA, [facility] 07/08/2013 11:36 AM

## 2013-07-08 NOTE — Assessment & Plan Note (Signed)
Patient had 2 episodes of syncope, one in March and one last month. In the emergency room he was found have a blood alcohol level of 218. The episode of sudden onset. There is no seizure activity. His exam is benign. I'm going to order to get good LV function and have him on the monitor I will see him back after that for further evaluation. I have counseled him about not driving for 6 months from his index event.

## 2013-07-08 NOTE — Assessment & Plan Note (Signed)
Controlled on current medications 

## 2013-07-13 ENCOUNTER — Ambulatory Visit: Payer: Self-pay

## 2014-06-08 ENCOUNTER — Emergency Department (HOSPITAL_COMMUNITY): Payer: Self-pay

## 2014-06-08 ENCOUNTER — Encounter (HOSPITAL_COMMUNITY): Payer: Self-pay | Admitting: Emergency Medicine

## 2014-06-08 ENCOUNTER — Emergency Department (HOSPITAL_COMMUNITY)
Admission: EM | Admit: 2014-06-08 | Discharge: 2014-06-09 | Disposition: A | Discharge status: Home / Self Care | Payer: Self-pay | Attending: Emergency Medicine | Admitting: Emergency Medicine

## 2014-06-08 DIAGNOSIS — Z720 Tobacco use: Secondary | ICD-10-CM | POA: Insufficient documentation

## 2014-06-08 DIAGNOSIS — Z79899 Other long term (current) drug therapy: Secondary | ICD-10-CM | POA: Insufficient documentation

## 2014-06-08 DIAGNOSIS — I10 Essential (primary) hypertension: Secondary | ICD-10-CM | POA: Insufficient documentation

## 2014-06-08 DIAGNOSIS — J209 Acute bronchitis, unspecified: Secondary | ICD-10-CM | POA: Insufficient documentation

## 2014-06-08 DIAGNOSIS — Z8639 Personal history of other endocrine, nutritional and metabolic disease: Secondary | ICD-10-CM | POA: Insufficient documentation

## 2014-06-08 LAB — CBC
HCT: 38 % — ABNORMAL LOW (ref 39.0–52.0)
Hemoglobin: 13.3 g/dL (ref 13.0–17.0)
MCH: 32.7 pg (ref 26.0–34.0)
MCHC: 35 g/dL (ref 30.0–36.0)
MCV: 93.4 fL (ref 78.0–100.0)
Platelets: 254 10*3/uL (ref 150–400)
RBC: 4.07 MIL/uL — AB (ref 4.22–5.81)
RDW: 13.9 % (ref 11.5–15.5)
WBC: 4.5 10*3/uL (ref 4.0–10.5)

## 2014-06-08 IMAGING — DX DG CHEST 2V
2 series · 2 of 2 positions shown · non-contrast
Comparison: Chest radiograph performed 06/28/2013

CLINICAL DATA: Acute onset of shortness of breath and cough.
Initial encounter.

EXAM:
CHEST  2 VIEW

[chest pa]
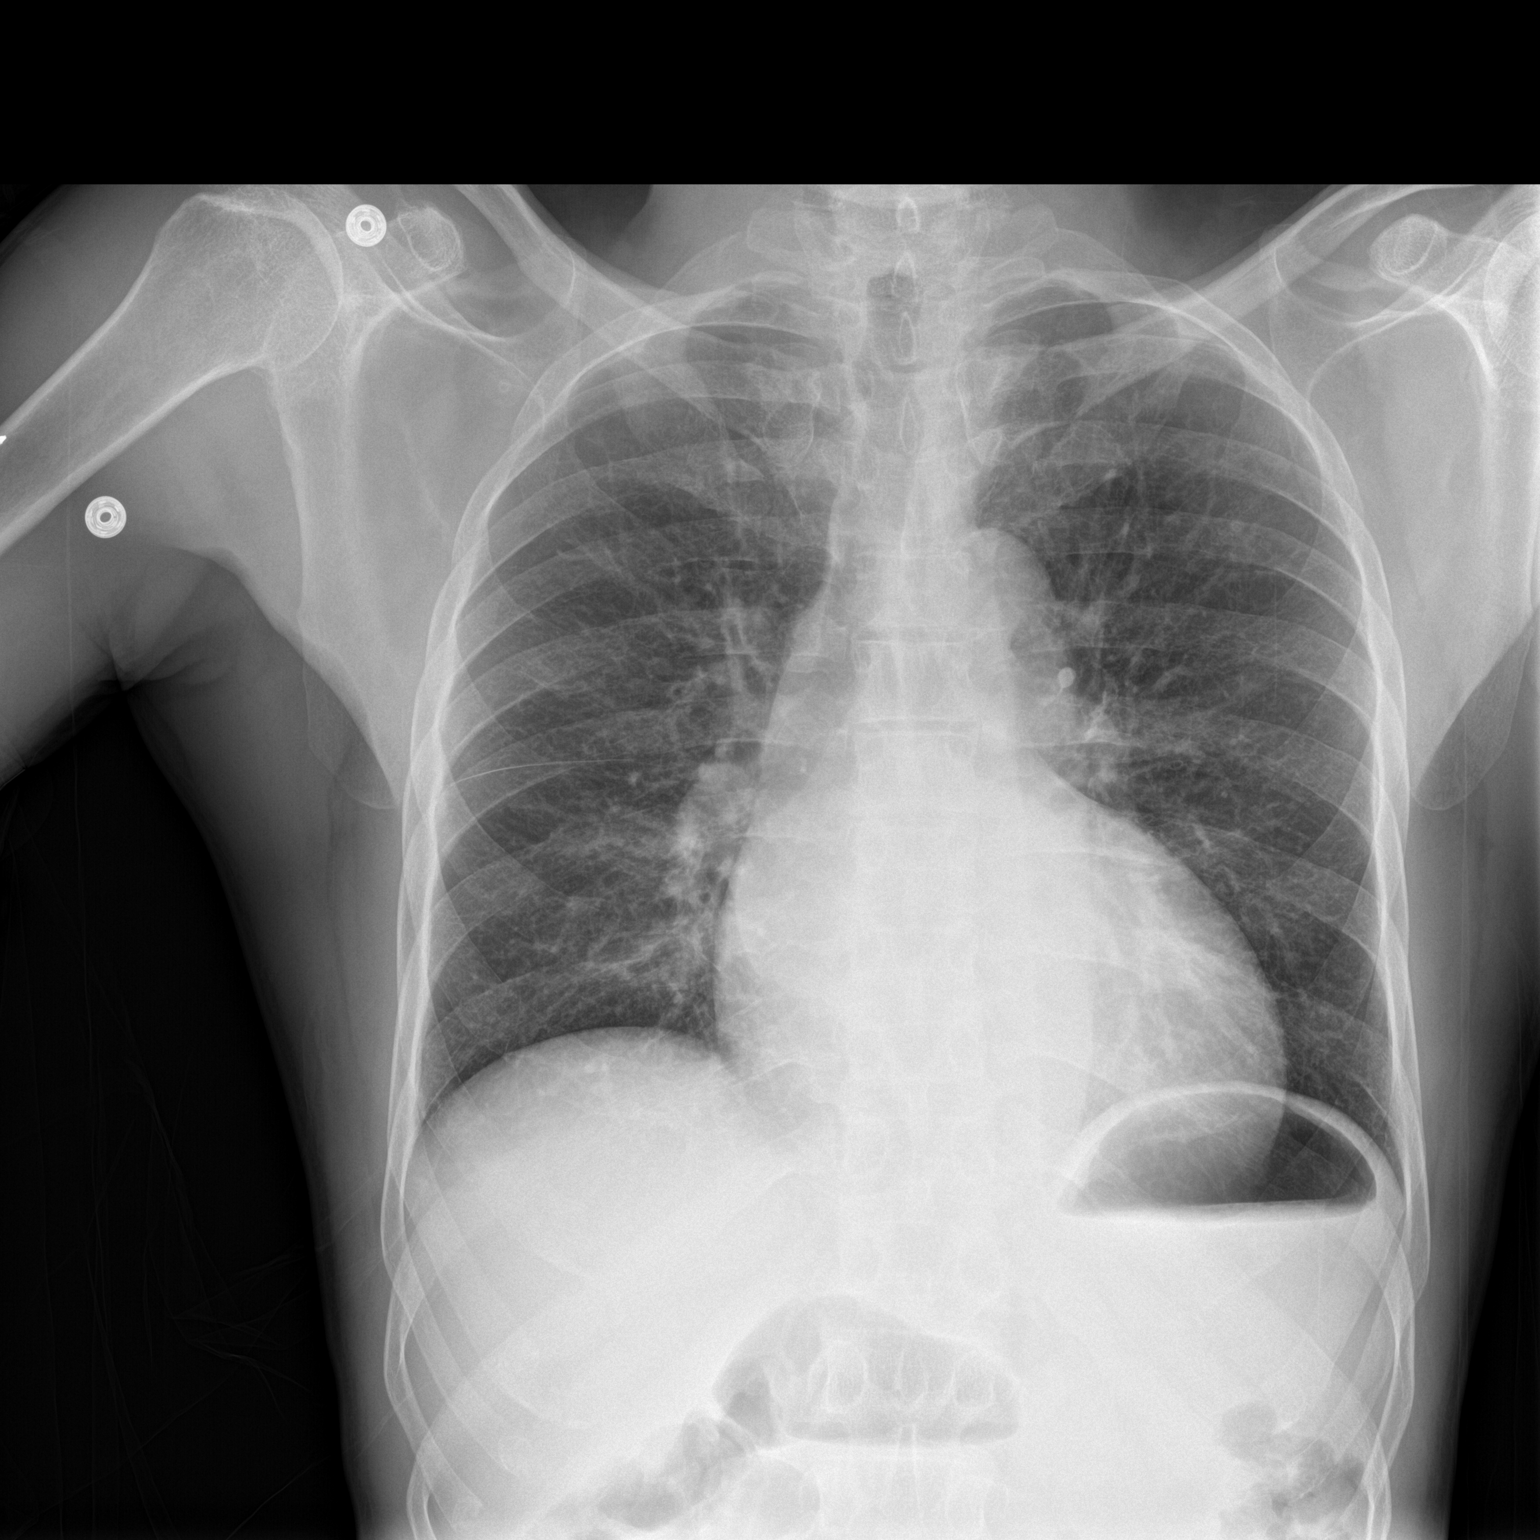

[chest lat]
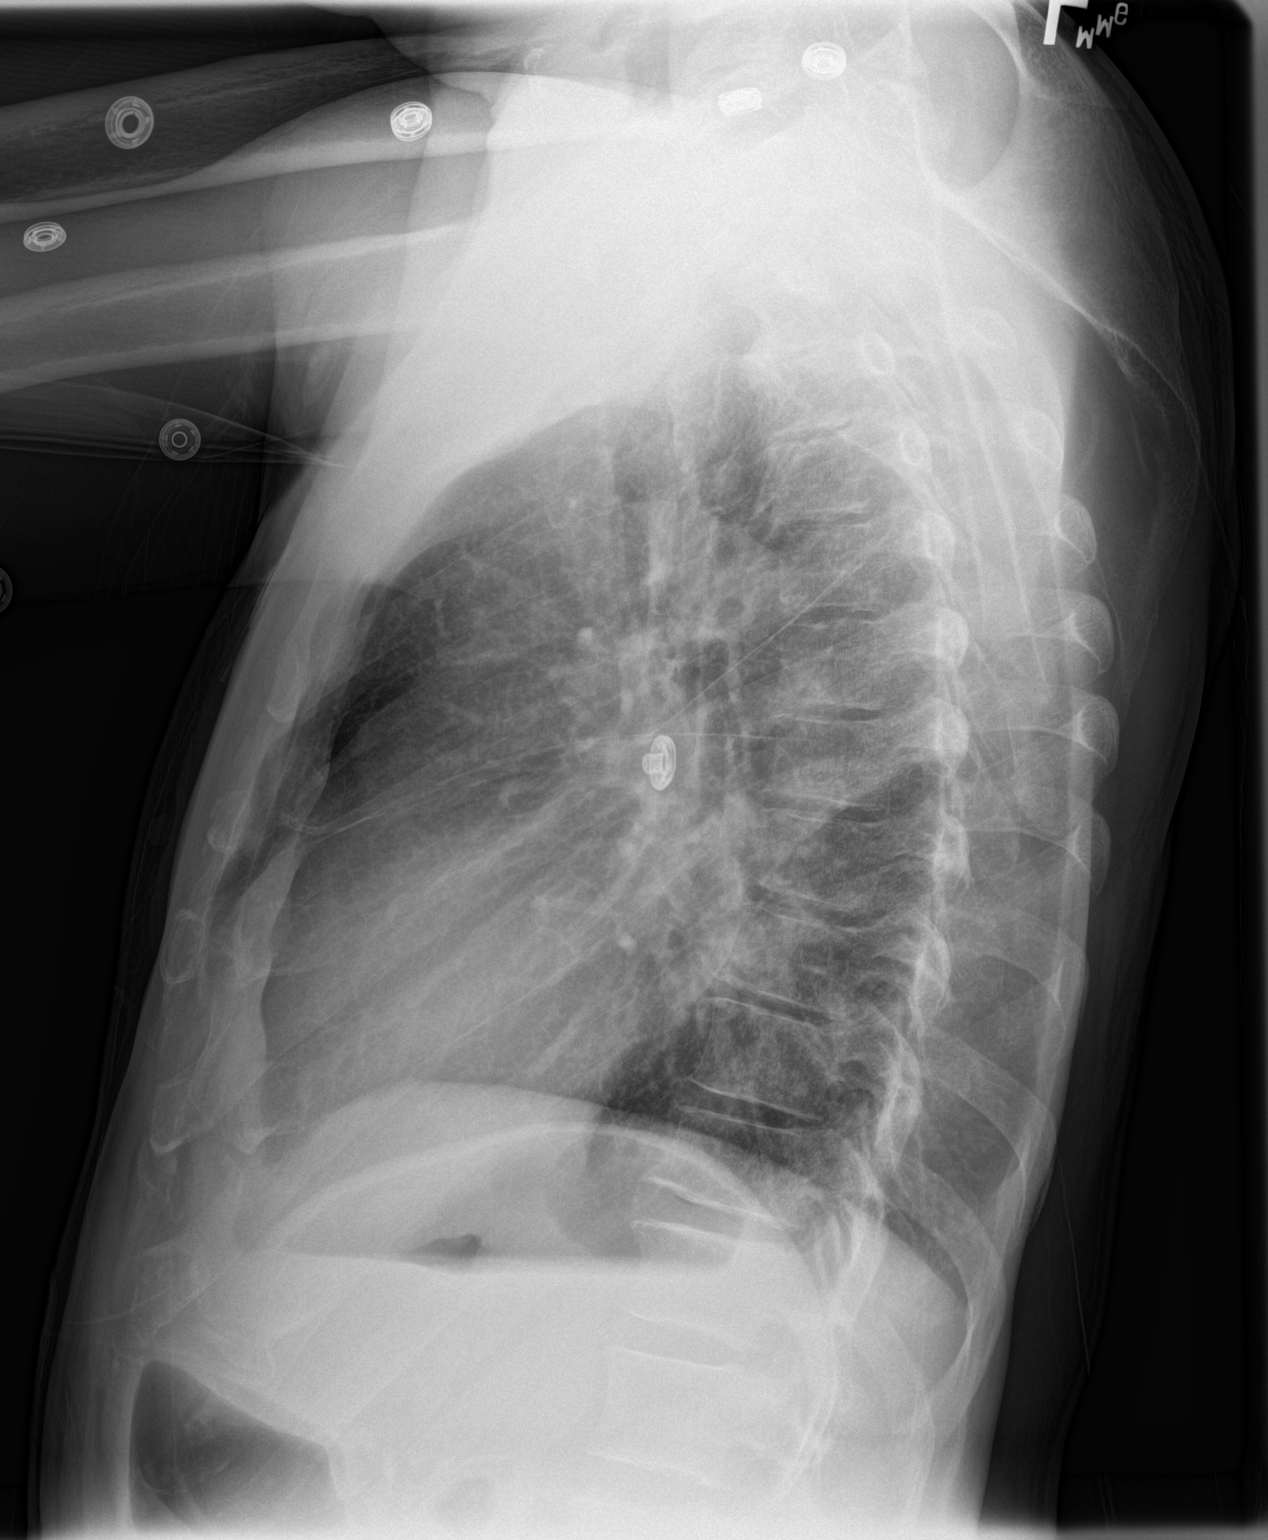

[2 of 2 positions shown; findings below may reference images not displayed]

FINDINGS: The lungs are well-aerated. Peribronchial thickening is noted, with
increased interstitial markings. There is no evidence of focal
opacification, pleural effusion or pneumothorax.

The heart is mildly enlarged. No acute osseous abnormalities are
seen.
IMPRESSION: Peribronchial thickening, with increased interstitial markings. Mild
cardiomegaly noted. This may reflect mild interstitial lung disease,
infection or possibly mild interstitial edema.

## 2014-06-08 MED ORDER — METHYLPREDNISOLONE SODIUM SUCC 125 MG IJ SOLR
125.0000 mg | Freq: Once | INTRAMUSCULAR | Status: AC
  Administered 2014-06-08: 125 mg via INTRAVENOUS
  Filled 2014-06-08: qty 2

## 2014-06-08 MED ORDER — IPRATROPIUM BROMIDE 0.02 % IN SOLN
0.5000 mg | Freq: Once | RESPIRATORY_TRACT | Status: AC
  Administered 2014-06-09: 0.5 mg via RESPIRATORY_TRACT
  Filled 2014-06-08: qty 2.5

## 2014-06-08 MED ORDER — ALBUTEROL SULFATE (2.5 MG/3ML) 0.083% IN NEBU
5.0000 mg | INHALATION_SOLUTION | Freq: Once | RESPIRATORY_TRACT | Status: AC
  Administered 2014-06-09: 5 mg via RESPIRATORY_TRACT
  Filled 2014-06-08: qty 6

## 2014-06-08 NOTE — ED Notes (Signed)
Per EMS: Pt reports cough x4 days with questionable fever in first couple of days.  Pt was given a saline neb en route with rhonchi heard on auscultation.  Pt in no pain.  Pt has been taking mucinex for congestion.  134/93, 82hr, 18rr.

## 2014-06-08 NOTE — ED Provider Notes (Signed)
CSN: [id number]     Arrival date & time 06/08/14  2227 History   First MD Initiated Contact with Patient 06/08/14 2231     Chief Complaint  Patient presents with  . Shortness of Breath    Patient is a 59 y.o. male presenting with shortness of breath and cough. The history is provided by the patient.  Shortness of Breath Severity:  Moderate Relieved by:  Nothing Associated symptoms: cough, fever and sputum production   Cough Cough characteristics:  Dry and productive Sputum characteristics:  Yellow Severity:  Severe Onset quality:  Gradual Duration:  3 days Timing:  Constant Relieved by:  Nothing Ineffective treatments:  Cough suppressants Associated symptoms: fever and shortness of breath   Pt has not slept well the last few nights because of the coughing.  He does smoke cigarettes.   Past Medical History  Diagnosis Date  . Hypertension     Previous anti-hypertensive meds but ran out  . Hyperthyroidism   . Syncope   . Alcohol abuse    Past Surgical History  Procedure Laterality Date  . Clavicle surgery      15 years ago  . Stomach surgery      stop bleeding from a trauma  . Orif ankle fracture  02/03/2011    Procedure: OPEN REDUCTION INTERNAL FIXATION (ORIF) ANKLE FRACTURE;  Surgeon: Thera Flake., MD;  Location: [facility];  Service: Orthopedics;  Laterality: Left;   History reviewed. No pertinent family history. History  Substance Use Topics  . Smoking status: Current Every Day Smoker -- 0.50 packs/day  . Smokeless tobacco: Not on file  . Alcohol Use: Yes     Comment: occasionally    Review of Systems  Constitutional: Positive for fever.  Respiratory: Positive for cough, sputum production and shortness of breath.   All other systems reviewed and are negative.     Allergies  Review of patient's allergies indicates no known allergies.  Home Medications   Prior to Admission medications   Medication Sig Start Date End Date Taking? Authorizing Provider   guaiFENesin (MUCINEX) 600 MG 12 hr tablet Take 600 mg by mouth 2 (two) times daily as needed for cough or to loosen phlegm.   Yes Historical Provider, MD  lisinopril-hydrochlorothiazide (PRINZIDE,ZESTORETIC) 10-12.5 MG per tablet Take 1 tablet by mouth daily. 06/29/13  Yes Garlon Hatchet, PA-C  azithromycin (ZITHROMAX) 250 MG tablet Take 1 tablet (250 mg total) by mouth daily. Take first 2 tablets together, then 1 every day until finished. 06/09/14   Linwood Dibbles, MD  predniSONE (DELTASONE) 20 MG tablet Take 3 tablets (60 mg total) by mouth daily. 06/09/14   Linwood Dibbles, MD   BP 139/100 mmHg  Pulse 83  Temp(Src) 98 F (36.7 C) (Oral)  Resp 19  Ht  (1.753 m)  Wt 145 lb (65.772 kg)  BMI 21.40 kg/m2  SpO2 97% Physical Exam  Constitutional: No distress.  HENT:  Head: Normocephalic and atraumatic.  Right Ear: External ear normal.  Left Ear: External ear normal.  Eyes: Conjunctivae are normal. Right eye exhibits no discharge. Left eye exhibits no discharge. No scleral icterus.  Neck: Neck supple. No tracheal deviation present.  Cardiovascular: Normal rate, regular rhythm and intact distal pulses.   Pulmonary/Chest: Effort normal. No stridor. No respiratory distress. He has wheezes. He has no rales.  Abdominal: Soft. Bowel sounds are normal. He exhibits no distension. There is no tenderness. There is no rebound and no guarding.  Musculoskeletal: He exhibits  no edema or tenderness.  Neurological: He is alert. He has normal strength. No cranial nerve deficit (no facial droop, extraocular movements intact, no slurred speech) or sensory deficit. He exhibits normal muscle tone. He displays no seizure activity. Coordination normal.  Skin: Skin is warm and dry. No rash noted.  Psychiatric: He has a normal mood and affect.  Nursing note and vitals reviewed.   ED Course  Procedures (including critical care time) Labs Review Labs Reviewed  CBC - Abnormal; Notable for the following:    RBC 4.07 (*)     HCT 38.0 (*)    All other components within normal limits  BASIC METABOLIC PANEL    Imaging Review Dg Chest 2 View  06/08/2014   CLINICAL DATA:  Acute onset of shortness of breath and cough. Initial encounter.  EXAM: CHEST  2 VIEW  COMPARISON:  Chest radiograph performed 06/28/2013  FINDINGS: The lungs are well-aerated. Peribronchial thickening is noted, with increased interstitial markings. There is no evidence of focal opacification, pleural effusion or pneumothorax.  The heart is mildly enlarged. No acute osseous abnormalities are seen.  IMPRESSION: Peribronchial thickening, with increased interstitial markings. Mild cardiomegaly noted. This may reflect mild interstitial lung disease, infection or possibly mild interstitial edema.   Electronically Signed   By: Roanna Raider M.D.   On: 06/08/2014 23:14    Medications  albuterol (PROVENTIL HFA;VENTOLIN HFA) 108 (90 BASE) MCG/ACT inhaler 1-2 puff (not administered)  albuterol (PROVENTIL) (2.5 MG/3ML) 0.083% nebulizer solution 5 mg (5 mg Nebulization Given 06/09/14 0012)  ipratropium (ATROVENT) nebulizer solution 0.5 mg (0.5 mg Nebulization Given 06/09/14 0012)  methylPREDNISolone sodium succinate (SOLU-MEDROL) 125 mg/2 mL injection 125 mg (125 mg Intravenous Given 06/08/14 2337)     MDM   Final diagnoses:  Bronchitis with bronchospasm   Patient improved after his albuterol Atrovent treatment.  His lung sounds improved. Patient's symptoms are consistent with a COPD type exacerbation. I suggested he try to quit smoking. I will discharge him home with an albuterol inhaler and a course of oral steroids. Considering the possible interstitial findings on x-ray discharge him home on a course of azithromycin   Linwood Dibbles, MD 06/09/14 [phone]

## 2014-06-09 LAB — BASIC METABOLIC PANEL
ANION GAP: 15 (ref 5–15)
BUN: 11 mg/dL (ref 6–23)
CALCIUM: 8.9 mg/dL (ref 8.4–10.5)
CO2: 20 mmol/L (ref 19–32)
Chloride: 103 mmol/L (ref 96–112)
Creatinine, Ser: 0.86 mg/dL (ref 0.50–1.35)
GLUCOSE: 84 mg/dL (ref 70–99)
Potassium: 3.6 mmol/L (ref 3.5–5.1)
Sodium: 138 mmol/L (ref 135–145)

## 2014-06-09 MED ORDER — AZITHROMYCIN 250 MG PO TABS: 250.0000 mg | ORAL_TABLET | Freq: Every day | ORAL | Status: DC

## 2014-06-09 MED ORDER — PREDNISONE 20 MG PO TABS: 60.0000 mg | ORAL_TABLET | Freq: Every day | ORAL | Status: DC

## 2014-06-09 MED ORDER — ALBUTEROL SULFATE HFA 108 (90 BASE) MCG/ACT IN AERS
1.0000 | INHALATION_SPRAY | RESPIRATORY_TRACT | Status: DC | PRN
  Administered 2014-06-09: 2 via RESPIRATORY_TRACT
  Filled 2014-06-09: qty 6.7

## 2014-06-09 NOTE — Discharge Instructions (Signed)

## 2014-06-09 NOTE — ED Notes (Signed)
Pt made aware to return if symptoms worsen or if any life threatening symptoms occur.   

## 2015-02-21 ENCOUNTER — Emergency Department (HOSPITAL_COMMUNITY): Payer: Self-pay

## 2015-02-21 ENCOUNTER — Inpatient Hospital Stay (HOSPITAL_COMMUNITY): Payer: Self-pay

## 2015-02-21 ENCOUNTER — Encounter (HOSPITAL_COMMUNITY): Payer: Self-pay | Admitting: Emergency Medicine

## 2015-02-21 ENCOUNTER — Inpatient Hospital Stay (HOSPITAL_COMMUNITY)
Admission: EM | Admit: 2015-02-21 | Discharge: 2015-02-24 | DRG: 287 | Disposition: A | Discharge status: Home / Self Care | Payer: Self-pay | Attending: Student in an Organized Health Care Education/Training Program | Admitting: Student in an Organized Health Care Education/Training Program

## 2015-02-21 DIAGNOSIS — F17210 Nicotine dependence, cigarettes, uncomplicated: Secondary | ICD-10-CM | POA: Diagnosis present

## 2015-02-21 DIAGNOSIS — R1013 Epigastric pain: Secondary | ICD-10-CM | POA: Insufficient documentation

## 2015-02-21 DIAGNOSIS — I429 Cardiomyopathy, unspecified: Secondary | ICD-10-CM | POA: Diagnosis present

## 2015-02-21 DIAGNOSIS — I509 Heart failure, unspecified: Secondary | ICD-10-CM

## 2015-02-21 DIAGNOSIS — E0590 Thyrotoxicosis, unspecified without thyrotoxic crisis or storm: Secondary | ICD-10-CM | POA: Diagnosis present

## 2015-02-21 DIAGNOSIS — K861 Other chronic pancreatitis: Secondary | ICD-10-CM | POA: Diagnosis present

## 2015-02-21 DIAGNOSIS — I252 Old myocardial infarction: Secondary | ICD-10-CM

## 2015-02-21 DIAGNOSIS — I248 Other forms of acute ischemic heart disease: Secondary | ICD-10-CM | POA: Diagnosis present

## 2015-02-21 DIAGNOSIS — I10 Essential (primary) hypertension: Secondary | ICD-10-CM | POA: Diagnosis present

## 2015-02-21 DIAGNOSIS — R17 Unspecified jaundice: Secondary | ICD-10-CM | POA: Diagnosis present

## 2015-02-21 DIAGNOSIS — I5022 Chronic systolic (congestive) heart failure: Secondary | ICD-10-CM | POA: Insufficient documentation

## 2015-02-21 DIAGNOSIS — F1010 Alcohol abuse, uncomplicated: Secondary | ICD-10-CM | POA: Diagnosis present

## 2015-02-21 DIAGNOSIS — I5021 Acute systolic (congestive) heart failure: Principal | ICD-10-CM | POA: Diagnosis present

## 2015-02-21 DIAGNOSIS — R809 Proteinuria, unspecified: Secondary | ICD-10-CM | POA: Diagnosis present

## 2015-02-21 DIAGNOSIS — D72819 Decreased white blood cell count, unspecified: Secondary | ICD-10-CM | POA: Diagnosis present

## 2015-02-21 DIAGNOSIS — R0602 Shortness of breath: Secondary | ICD-10-CM | POA: Diagnosis present

## 2015-02-21 DIAGNOSIS — I358 Other nonrheumatic aortic valve disorders: Secondary | ICD-10-CM | POA: Diagnosis present

## 2015-02-21 DIAGNOSIS — Z7982 Long term (current) use of aspirin: Secondary | ICD-10-CM

## 2015-02-21 LAB — CBC WITH DIFFERENTIAL/PLATELET
BASOS ABS: 0 10*3/uL (ref 0.0–0.1)
Basophils Relative: 0 %
EOS PCT: 2 %
Eosinophils Absolute: 0 10*3/uL (ref 0.0–0.7)
HCT: 36.9 % — ABNORMAL LOW (ref 39.0–52.0)
Hemoglobin: 12.4 g/dL — ABNORMAL LOW (ref 13.0–17.0)
Lymphocytes Relative: 34 %
Lymphs Abs: 0.9 10*3/uL (ref 0.7–4.0)
MCH: 32.8 pg (ref 26.0–34.0)
MCHC: 33.6 g/dL (ref 30.0–36.0)
MCV: 97.6 fL (ref 78.0–100.0)
Monocytes Absolute: 0.4 10*3/uL (ref 0.1–1.0)
Monocytes Relative: 14 %
Neutro Abs: 1.4 10*3/uL — ABNORMAL LOW (ref 1.7–7.7)
Neutrophils Relative %: 50 %
PLATELETS: 176 10*3/uL (ref 150–400)
RBC: 3.78 MIL/uL — AB (ref 4.22–5.81)
RDW: 15 % (ref 11.5–15.5)
WBC: 2.7 10*3/uL — AB (ref 4.0–10.5)

## 2015-02-21 LAB — COMPREHENSIVE METABOLIC PANEL
ALT: 34 U/L (ref 17–63)
ANION GAP: 8 (ref 5–15)
AST: 45 U/L — ABNORMAL HIGH (ref 15–41)
Albumin: 3.4 g/dL — ABNORMAL LOW (ref 3.5–5.0)
Alkaline Phosphatase: 104 U/L (ref 38–126)
BILIRUBIN TOTAL: 2 mg/dL — AB (ref 0.3–1.2)
BUN: 18 mg/dL (ref 6–20)
CO2: 23 mmol/L (ref 22–32)
Calcium: 8.7 mg/dL — ABNORMAL LOW (ref 8.9–10.3)
Chloride: 109 mmol/L (ref 101–111)
Creatinine, Ser: 0.9 mg/dL (ref 0.61–1.24)
Glucose, Bld: 99 mg/dL (ref 65–99)
POTASSIUM: 3.8 mmol/L (ref 3.5–5.1)
Sodium: 140 mmol/L (ref 135–145)
TOTAL PROTEIN: 6.7 g/dL (ref 6.5–8.1)

## 2015-02-21 LAB — TROPONIN I
TROPONIN I: 0.03 ng/mL (ref <0.031)
TROPONIN I: 0.04 ng/mL — AB (ref <0.031)

## 2015-02-21 LAB — BRAIN NATRIURETIC PEPTIDE: B NATRIURETIC PEPTIDE 5: 1803.7 pg/mL — AB (ref 0.0–100.0)

## 2015-02-21 LAB — URINALYSIS, ROUTINE W REFLEX MICROSCOPIC
Glucose, UA: 100 mg/dL — AB
HGB URINE DIPSTICK: NEGATIVE
KETONES UR: NEGATIVE mg/dL
LEUKOCYTES UA: NEGATIVE
Nitrite: NEGATIVE
PROTEIN: 100 mg/dL — AB
Specific Gravity, Urine: 1.03 (ref 1.005–1.030)
pH: 5.5 (ref 5.0–8.0)

## 2015-02-21 LAB — URINE MICROSCOPIC-ADD ON: RBC / HPF: NONE SEEN RBC/hpf (ref 0–5)

## 2015-02-21 LAB — TSH: TSH: 0.929 u[IU]/mL (ref 0.350–4.500)

## 2015-02-21 LAB — LIPASE, BLOOD: Lipase: 23 U/L (ref 11–51)

## 2015-02-21 IMAGING — DX DG CHEST 2V
2 series · 2 of 2 positions shown · non-contrast
Comparison: 06/08/2014 and 06/28/2013.

CLINICAL DATA: Shortness of breath for 4 days. Abdominal pain and
bloating.

EXAM:
CHEST  2 VIEW

[w chest pa]
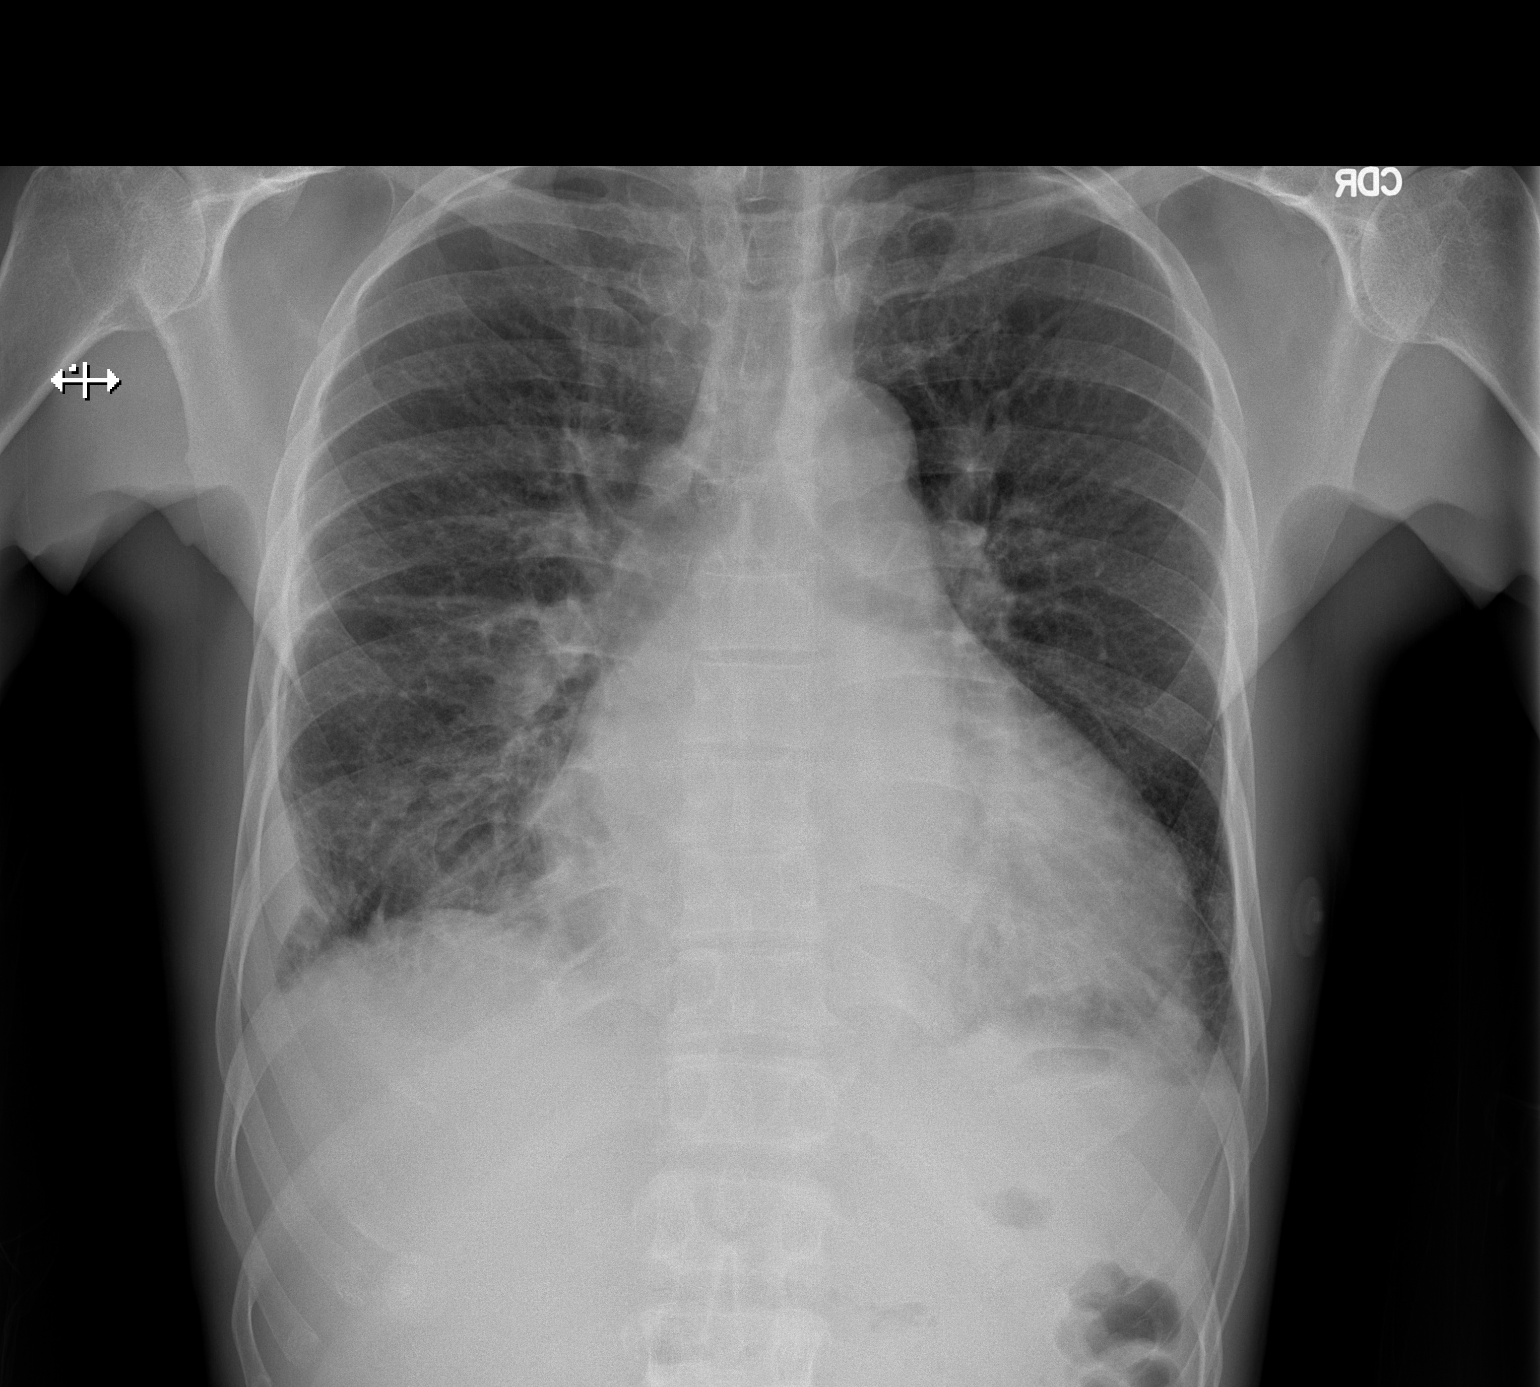

[w chest lat]
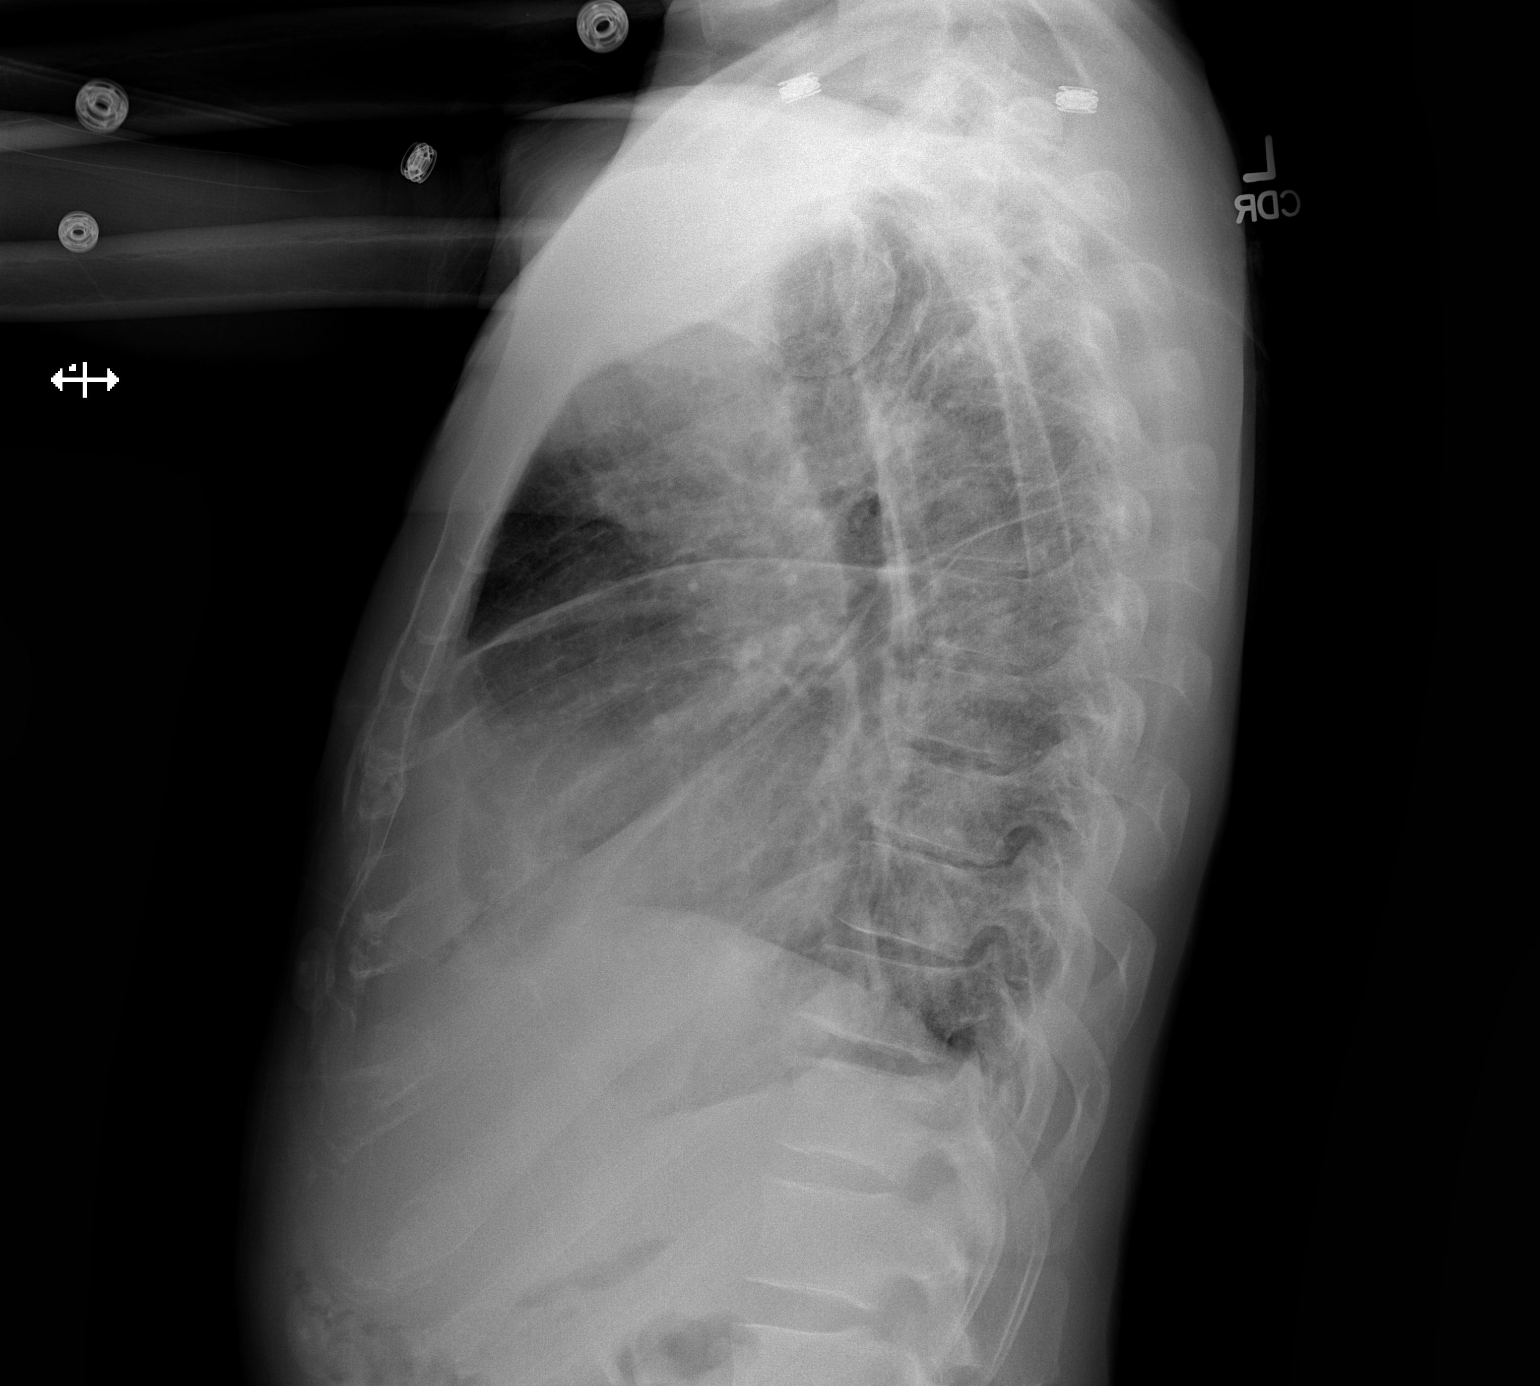

[2 of 2 positions shown; findings below may reference images not displayed]

FINDINGS: There is progressive enlargement of the cardiac silhouette. There
are increased interstitial markings with new small bilateral pleural
effusions and fissural thickening bilaterally. There is no confluent
airspace opacity or pneumothorax. The bones appear unchanged.
IMPRESSION: Increased cardiomegaly, pulmonary edema and bilateral pleural
effusions suspicious for congestive heart failure.

## 2015-02-21 IMAGING — US US ABDOMEN LIMITED
1 series · 14 of 25 positions shown · non-contrast
Comparison: None.

CLINICAL DATA: Epigastric pain for 1.5 weeks.

EXAM:
US ABDOMEN LIMITED - RIGHT UPPER QUADRANT

[Series 1: us abdomen limited · 0.14mm/px · 14 of 31 slices shown]
[im 1/31]
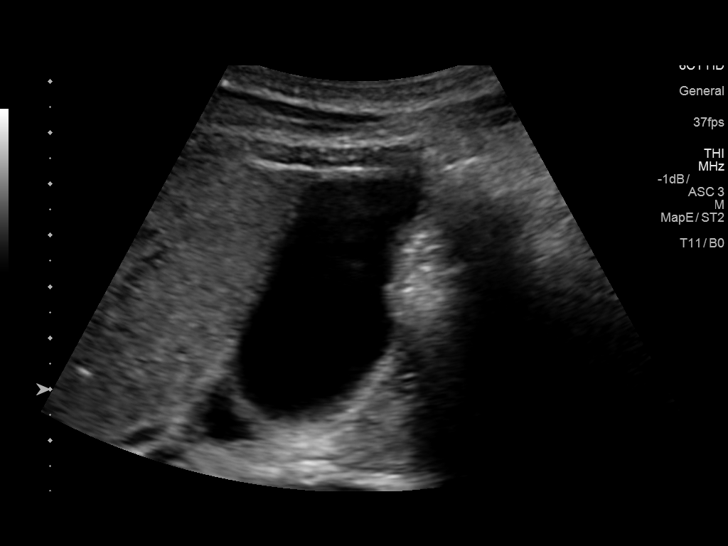
[im 3/31]
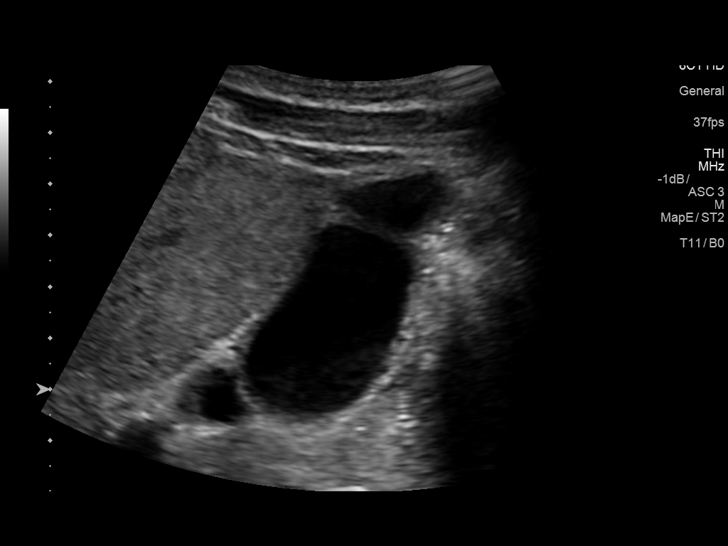
[im 6/31]
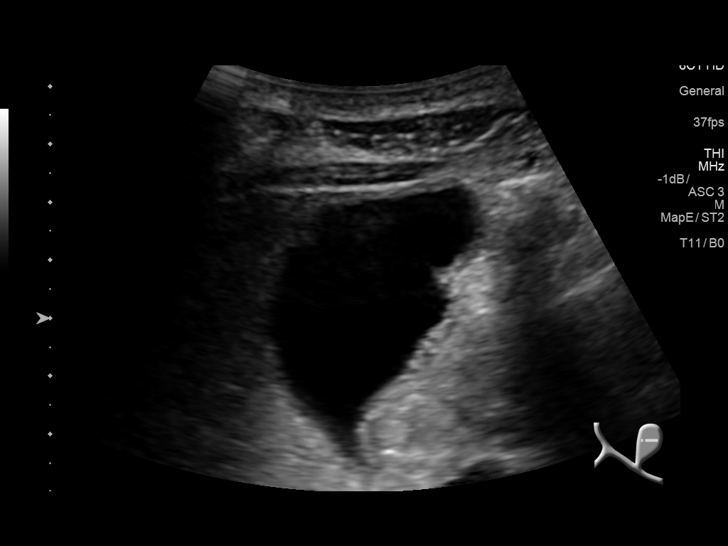
[im 8/31]
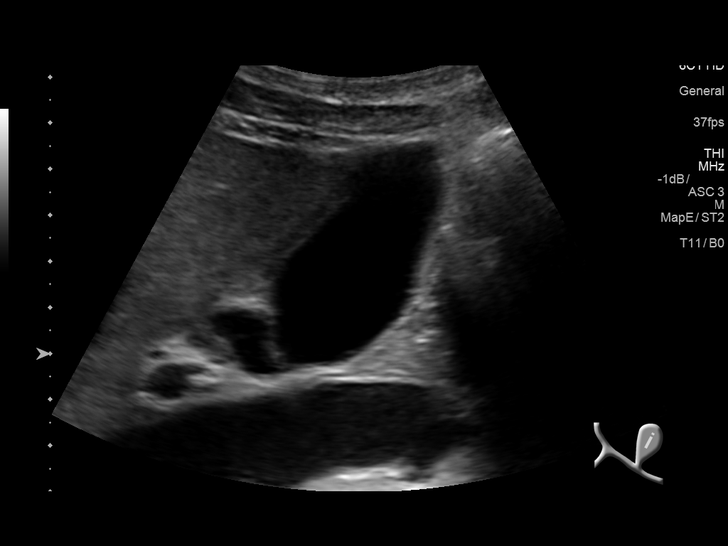
[im 11/31]
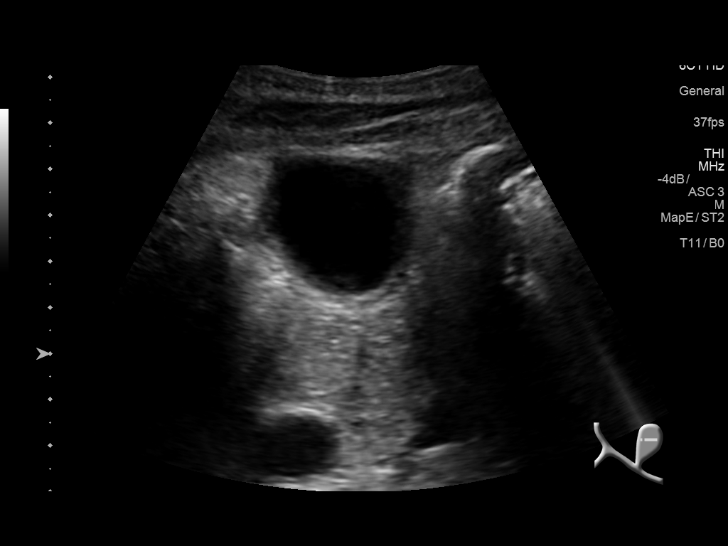
[im 12/31]
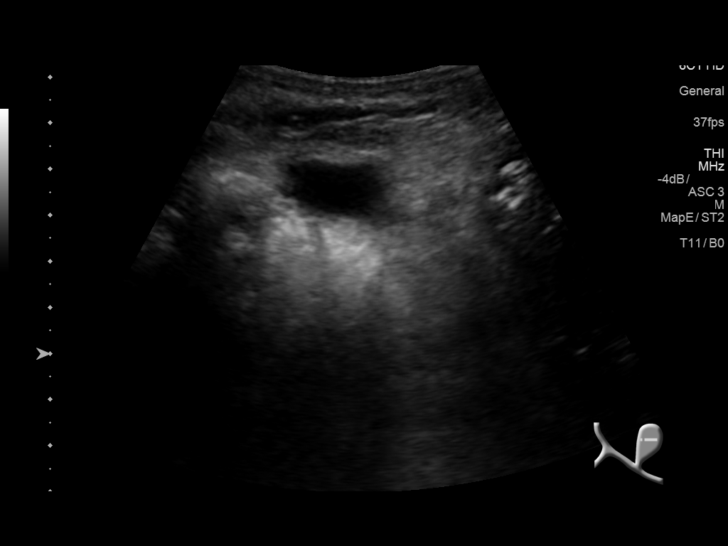
[im 14/31]
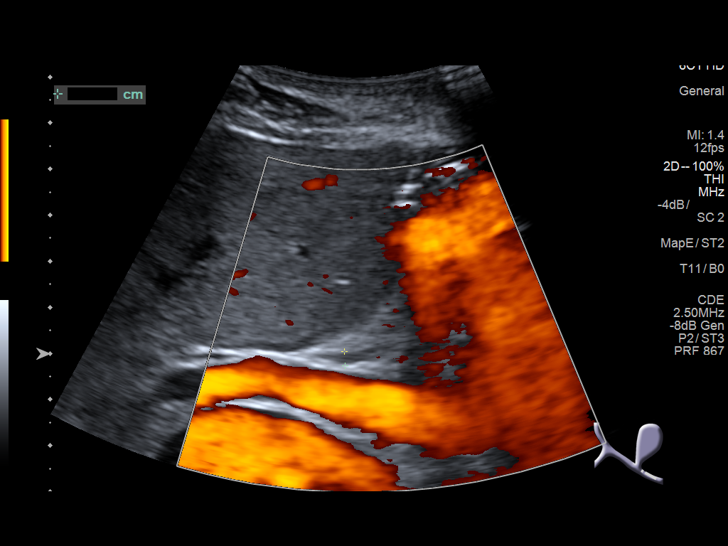
[im 17/31]
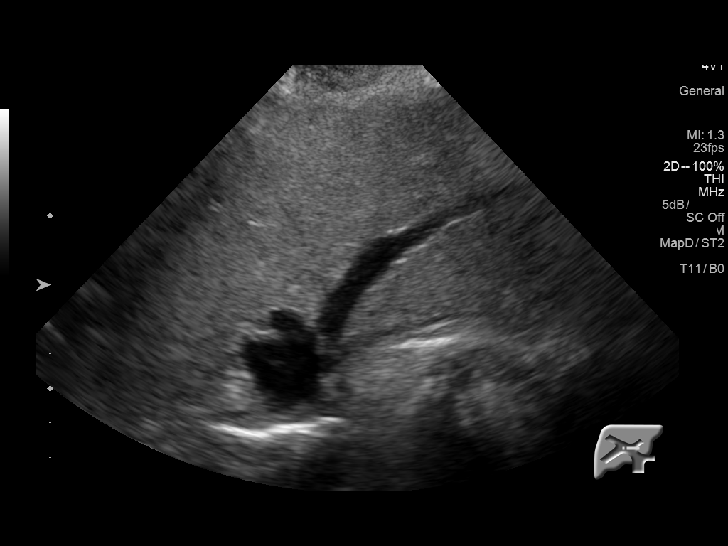
[im 19/31]
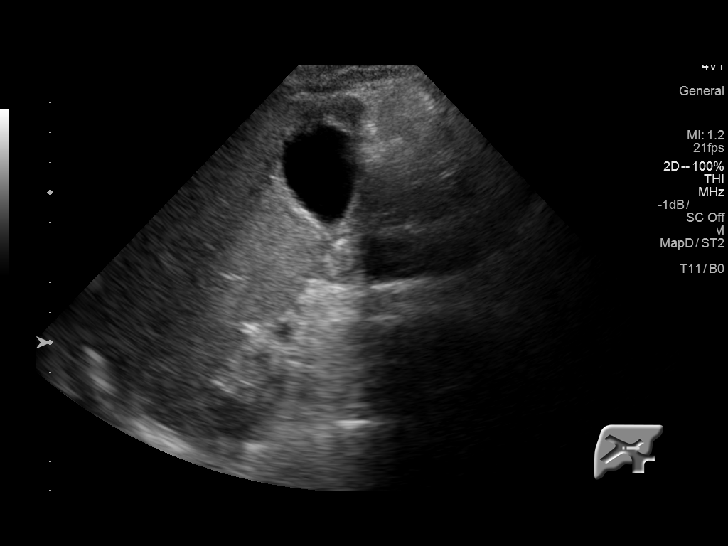
[im 21/31]
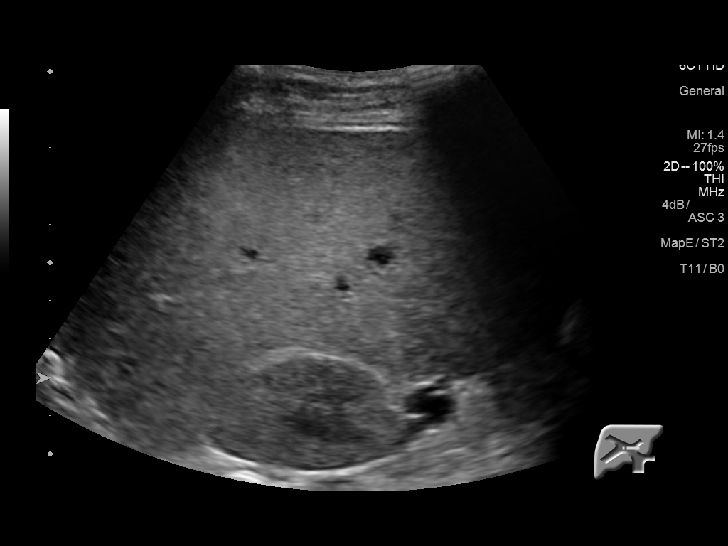
[im 23/31]
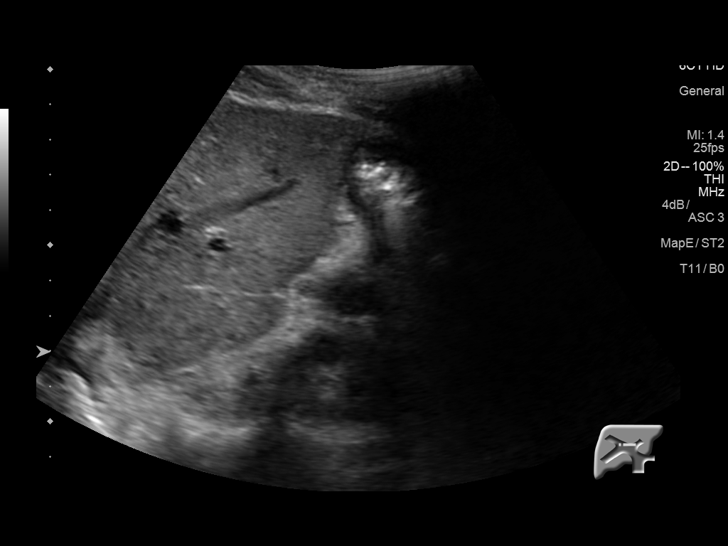
[im 26/31]
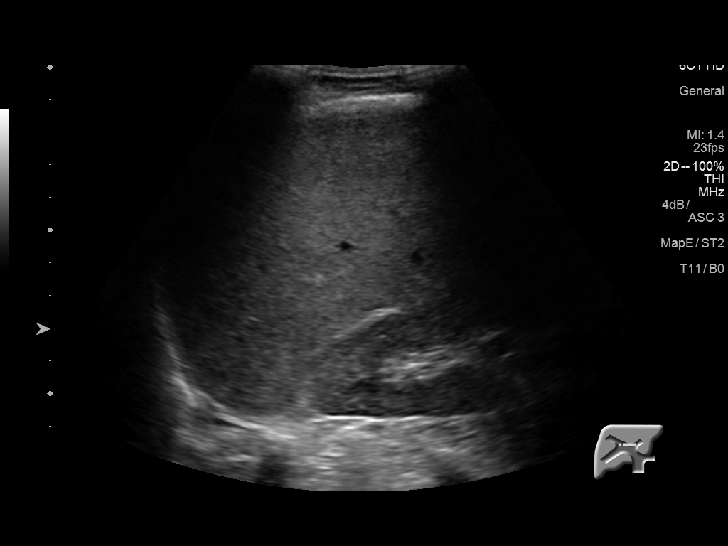
[im 28/31]
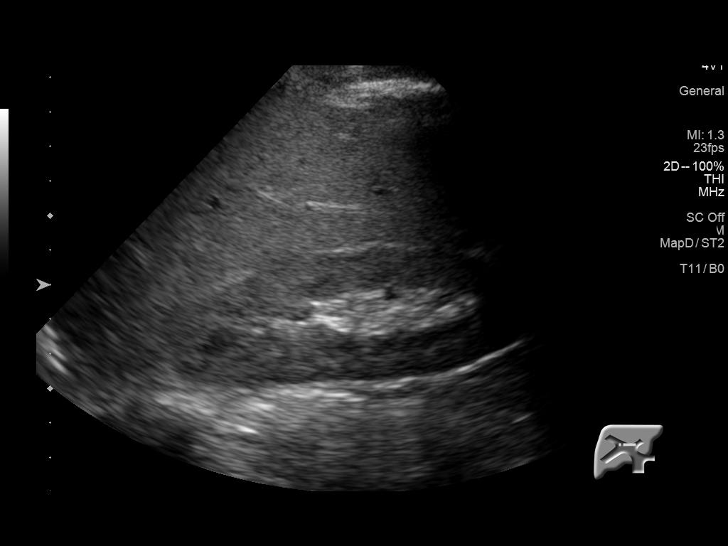
[im 31/31]
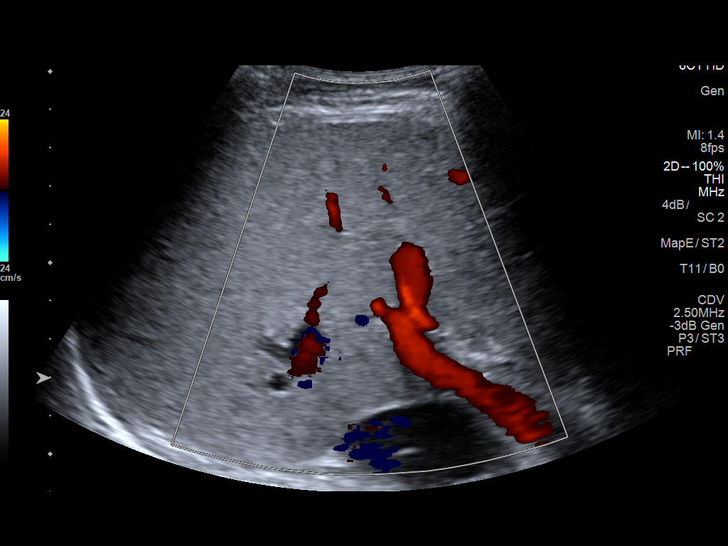

[14 of 25 positions shown; findings below may reference images not displayed]

FINDINGS: Gallbladder:

No gallstones or wall thickening visualized. No sonographic Murphy
sign noted.

Common bile duct:

Diameter: 4.2 mm, normal

Liver:

No focal lesion identified. Within normal limits in parenchymal
echogenicity.
IMPRESSION: Normal examination.  No evidence of cholelithiasis or cholecystitis.

## 2015-02-21 MED ORDER — LORAZEPAM 1 MG PO TABS: 1.0000 mg | ORAL_TABLET | Freq: Four times a day (QID) | ORAL | Status: DC | PRN

## 2015-02-21 MED ORDER — LISINOPRIL 5 MG PO TABS
5.0000 mg | ORAL_TABLET | Freq: Every day | ORAL | Status: DC
  Administered 2015-02-21 – 2015-02-23 (×3): 5 mg via ORAL
  Filled 2015-02-21 (×3): qty 1

## 2015-02-21 MED ORDER — ASPIRIN EC 81 MG PO TBEC
81.0000 mg | DELAYED_RELEASE_TABLET | Freq: Every day | ORAL | Status: DC
  Administered 2015-02-22 – 2015-02-23 (×2): 81 mg via ORAL
  Filled 2015-02-21 (×2): qty 1

## 2015-02-21 MED ORDER — FUROSEMIDE 10 MG/ML IJ SOLN
40.0000 mg | Freq: Two times a day (BID) | INTRAMUSCULAR | Status: DC
  Administered 2015-02-21 – 2015-02-22 (×2): 40 mg via INTRAVENOUS
  Filled 2015-02-21 (×2): qty 4

## 2015-02-21 MED ORDER — ENOXAPARIN SODIUM 40 MG/0.4ML ~~LOC~~ SOLN
40.0000 mg | SUBCUTANEOUS | Status: DC
  Administered 2015-02-21 – 2015-02-22 (×2): 40 mg via SUBCUTANEOUS
  Filled 2015-02-21 (×3): qty 0.4

## 2015-02-21 MED ORDER — THIAMINE HCL 100 MG/ML IJ SOLN: 100.0000 mg | Freq: Every day | INTRAMUSCULAR | Status: DC

## 2015-02-21 MED ORDER — FUROSEMIDE 10 MG/ML IJ SOLN
40.0000 mg | Freq: Once | INTRAMUSCULAR | Status: AC
  Administered 2015-02-21: 40 mg via INTRAVENOUS
  Filled 2015-02-21: qty 4

## 2015-02-21 MED ORDER — SENNOSIDES-DOCUSATE SODIUM 8.6-50 MG PO TABS
1.0000 | ORAL_TABLET | Freq: Every evening | ORAL | Status: DC | PRN
  Filled 2015-02-21: qty 1

## 2015-02-21 MED ORDER — SODIUM CHLORIDE 0.9 % IJ SOLN
3.0000 mL | Freq: Two times a day (BID) | INTRAMUSCULAR | Status: DC
  Administered 2015-02-21 – 2015-02-23 (×3): 3 mL via INTRAVENOUS

## 2015-02-21 MED ORDER — SODIUM CHLORIDE 0.9 % IJ SOLN
3.0000 mL | INTRAMUSCULAR | Status: DC | PRN
Start: 2015-02-21 — End: 2015-02-24

## 2015-02-21 MED ORDER — SODIUM CHLORIDE 0.9 % IJ SOLN
3.0000 mL | Freq: Two times a day (BID) | INTRAMUSCULAR | Status: DC
  Administered 2015-02-21 – 2015-02-23 (×5): 3 mL via INTRAVENOUS

## 2015-02-21 MED ORDER — VITAMIN B-1 100 MG PO TABS
100.0000 mg | ORAL_TABLET | Freq: Every day | ORAL | Status: DC
  Administered 2015-02-21 – 2015-02-23 (×3): 100 mg via ORAL
  Filled 2015-02-21 (×3): qty 1

## 2015-02-21 MED ORDER — SODIUM CHLORIDE 0.9 % IV SOLN: 250.0000 mL | INTRAVENOUS | Status: DC | PRN

## 2015-02-21 MED ORDER — FOLIC ACID 1 MG PO TABS
1.0000 mg | ORAL_TABLET | Freq: Every day | ORAL | Status: DC
  Administered 2015-02-21 – 2015-02-23 (×3): 1 mg via ORAL
  Filled 2015-02-21 (×3): qty 1

## 2015-02-21 MED ORDER — ASPIRIN 325 MG PO TABS
325.0000 mg | ORAL_TABLET | Freq: Once | ORAL | Status: AC
  Administered 2015-02-21: 325 mg via ORAL
  Filled 2015-02-21: qty 1

## 2015-02-21 MED ORDER — ALBUTEROL SULFATE (2.5 MG/3ML) 0.083% IN NEBU
5.0000 mg | INHALATION_SOLUTION | Freq: Once | RESPIRATORY_TRACT | Status: AC
  Administered 2015-02-21: 5 mg via RESPIRATORY_TRACT
  Filled 2015-02-21: qty 6

## 2015-02-21 MED ORDER — ADULT MULTIVITAMIN W/MINERALS CH
1.0000 | ORAL_TABLET | Freq: Every day | ORAL | Status: DC
  Administered 2015-02-21 – 2015-02-23 (×3): 1 via ORAL
  Filled 2015-02-21 (×3): qty 1

## 2015-02-21 MED ORDER — LORAZEPAM 2 MG/ML IJ SOLN: 1.0000 mg | Freq: Four times a day (QID) | INTRAMUSCULAR | Status: DC | PRN

## 2015-02-21 NOTE — Progress Notes (Signed)
Date: 02/21/2015               Patient Name:  Darin Brady MRN: [id number]  DOB: 17-Sep-1955 Age / Sex: 59 y.o., male   PCP: No Pcp Per Patient              Medical Service: Internal Medicine Teaching Service              Attending Physician: Dr. Tyson Alias, MD    First Contact: Katha Hamming, MS 3 Pager: [phone]  Second Contact: Dr. Karma Greaser Pager: [phone]  Third Contact Dr. Isabella Bowens Pager: [phone]       After Hours (After 5p/  First Contact Pager: [phone]  weekends / holidays): Second Contact Pager: [phone]   Chief Complaint: SOB  History of Present Illness: Darin Brady is a 59yo man with a PMH significant for untreated HTN who presents with four days of increasing SOB with walking. When he walks across a room he has trouble catching his breath and needs to stop. He also says that when he stands up at night to use the bathroom or walk around the house he has shortness of breath. He denies SOB while laying flat but says that he has had difficulty sleeping for the past several weeks, namely that he sleeps for an hour but then wakes up and cannot fall asleep again. He endorses occasional nonproductive cough.   He has also had a week of noticing that his face is swollen when he wakes up, however he says the swelling usually goes away. He also says that for the past week he feels like his abdomen is hard. He describes feeling like food sits in his stomach. He has pain in his epigastric region that radiates to his back about 20-30 minutes after he eats. He denies nausea and vomiting. His last bowel movement was yesterday and was not hard.   He denies any history of heart disease or past MI. He smokes half a pack per day and drinks four beers per day and has done so for years. He denies any drug use. 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   Meds: Current Facility-Administered Medications  Medication Dose Route Frequency Provider  Last Rate Last Dose  . 0.9 %  sodium chloride infusion  250 mL Intravenous PRN Lora Paula, MD      . Melene Muller ON 02/22/2015] aspirin EC tablet 81 mg  81 mg Oral Daily Lora Paula, MD      . enoxaparin (LOVENOX) injection 40 mg  40 mg Subcutaneous Q24H Lora Paula, MD      . furosemide (LASIX) injection 40 mg  40 mg Intravenous BID Lora Paula, MD      . lisinopril (PRINIVIL,ZESTRIL) tablet 5 mg  5 mg Oral Daily Lora Paula, MD      . senna-docusate (Senokot-S) tablet 1 tablet  1 tablet Oral QHS PRN Lora Paula, MD      . sodium chloride 0.9 % injection 3 mL  3 mL Intravenous Q12H Lora Paula, MD      . sodium chloride 0.9 % injection 3 mL  3 mL Intravenous Q12H Lora Paula, MD      . sodium chloride 0.9 % injection 3 mL  3 mL Intravenous PRN Lora Paula, MD       Current Outpatient Prescriptions  Medication Sig Dispense Refill  . azithromycin (ZITHROMAX) 250 MG tablet Take  1 tablet (250 mg total) by mouth daily. Take first 2 tablets together, then 1 every day until finished. (Patient not taking: Reported on 02/21/2015) 6 tablet 0  . guaiFENesin (MUCINEX) 600 MG 12 hr tablet Take 600 mg by mouth 2 (two) times daily as needed for cough or to loosen phlegm.    Marland Kitchen lisinopril-hydrochlorothiazide (PRINZIDE,ZESTORETIC) 10-12.5 MG per tablet Take 1 tablet by mouth daily. (Patient not taking: Reported on 02/21/2015) 30 tablet 0  . predniSONE (DELTASONE) 20 MG tablet Take 3 tablets (60 mg total) by mouth daily. (Patient not taking: Reported on 02/21/2015) 15 tablet 0    Allergies: Allergies as of 02/21/2015  . (No Known Allergies)   Past Medical History  Diagnosis Date  . Hypertension     Previous anti-hypertensive meds but ran out  . Hyperthyroidism   . Syncope   . Alcohol abuse    Past Surgical History  Procedure Laterality Date  . Clavicle surgery      15 years ago  . Stomach surgery      stop bleeding from a trauma  . Orif ankle fracture   02/03/2011    Procedure: OPEN REDUCTION INTERNAL FIXATION (ORIF) ANKLE FRACTURE;  Surgeon: Thera Flake., MD;  Location: [facility];  Service: Orthopedics;  Laterality: Left;   Family History  Problem Relation Age of Onset  . Asthma Mother    Social History   Social History  . Marital Status: Single    Spouse Name: N/A  . Number of Children: N/A  . Years of Education: N/A   Occupational History  . Not on file.   Social History Main Topics  . Smoking status: Current Every Day Smoker -- 0.50 packs/day  . Smokeless tobacco: Not on file  . Alcohol Use: Yes     Comment: occasionally  . Drug Use: No  . Sexual Activity: Yes   Other Topics Concern  . Not on file   Social History Narrative    Review of Systems: General: He denies fevers, chills, unintentional weight loss or weight gain. Positive for occasional night sweats.  Cardiovascular: He denies chest pain and LE swelling, but endorses feeling like his heart is racing when he has SOB. PULM: Positive for SOB, occasional cough. GI: He denies yellowing of the skin, diffuse itching, dark urine or pale stool, blood in his stool, nausea or vomiting. GU: He denies dysuria, frequency or difficulty voiding.  Endocrine: He denies intolerance to heat or cold.   Physical Exam: Blood pressure 141/109, pulse 85, temperature 97.7 F (36.5 C), temperature source Oral, resp. rate 18, height  (1.753 m), weight 62.653 kg (138 lb 2 oz), SpO2 96 %. General: Thin African American man laying in bed, alert, cooperative, in NAD HEENT: EOMI, PERRL, sclera anicteric but injected;no yellowing under the tongue,  MMM, oropharynx without erythema or exudate, no facial swelling noted  Cardiovascular: Regular rate and rhythm, S3, no murmurs appreciated Pulm: Crackles at the right base, no wheezing, normal respiratory effort GI: non-distended, no tenderness, no rebound, +BS, negative Murphey's sign, healed midline surgical scar Back: No paraspinal  tenderness, no CVA tenderness Extremities: 1+ pitting edema to the knee bilaterally, peripheral pulses 2+ in the upper extremities bilaterally and 1+ in the LEs, no clubbing or cyanosis Skin: Warm and dry, no jaundice  Lab results: WBC 2.7 RBC 3.78 Hgb 14.2 Hct 36.9 Absolute neutrophils 1.4  BNP 1803.7  Troponin 0.03  Lipase 23  Albumin 3.4 AST 45 ALT 34 T.bili 2.0  Urine glucose 100 Urine protein 100 Rare bacteria Granular casts  Imaging results:  Dg Chest 2 View  02/21/2015  CLINICAL DATA:  Shortness of breath for 4 days. Abdominal pain and bloating. EXAM: CHEST  2 VIEW COMPARISON:  06/08/2014 and 06/28/2013. FINDINGS: There is progressive enlargement of the cardiac silhouette. There are increased interstitial markings with new small bilateral pleural effusions and fissural thickening bilaterally. There is no confluent airspace opacity or pneumothorax. The bones appear unchanged. IMPRESSION: Increased cardiomegaly, pulmonary edema and bilateral pleural effusions suspicious for congestive heart failure. Electronically Signed   By: Carey Bullocks M.D.   On: 02/21/2015 10:15    Other results: EKG: normal sinus rhythm, RAD, LVH, prolonged QT and T wave inversion in the inferolateral leads (unchanged from prior EKG in 4/20015).  Assessment & Plan by Problem: Active Problems:   Acute CHF (congestive heart failure) (HCC)  Darin Brady is a 59yo man with a PMH significant for untreated HTN who presents with four days of increasing SOB with walking, most likely secondary to CHF.   Acute CHF: Patient presented with four days of increasing dyspnea on exertion. BNP was 1803.7 and CXR showed increased cardiomegaly, pulmonary edema and bilateral pleural effusions. Patient has HTN and has not taken any medication for the past two years. Initial troponin was negative and EKG was unchanged from prior. His chart mentions a history of hyperthyroidism but the patient denies this. Patient  received 40mg  lasix and 325mg  ASA in the ED.  -Consult cardiology.  -ACS ruleout: trend troponins EKG in AM.  -F/u A1C, lipid panel.  -81mg  ASA daily.  -Start lisinopril 5mg  daily. -Lasix 40mg  BID. -F/u TSH -Echocardiogram tomorrow.   Abdominal fullness: Patient with history of post-prandial epigastric/back pain. Scleral icterus on exam. AST was 45 and T.bili was 2.0. Lipase was wnl. Differential includes biliary colic, most likely given history of pain after meals and slightly elevated T.bili, and chronic pancreatitis, possible given history of achohol use. .  -RUQ [place]. -CMP tomorrow.  Leukopenia: Patient with WBC WBC of 2.6 today and Absolute neutrophils of 1.4. WBC was 3.8 67yr ago.  -F/u HIV test -Repeat CBC in the morning.   HTN: Patient with untreated HTN for the past two years. Previously on lisinopril-HCTZ 10-12.5mg  daily. BP currently 147/113. Patient with proteinuria, decreased albumin, LE edema and history of facial swelling concerning for possible nephrotic syndrome, however facial swelling may be related to dependent edema and proteinuria may be related to hypertensive damage to kidneys.  -Lisinopril as above.  -Consider obtaining urine protein/creatinine ratio.  Diet: Heart healthy DVT PPx: Lovenox Dispo: Admit for management of HF and investigation of other complaints. Anticipate discharge in 2-3 days.   This is a Psychologist, occupational Note.  The care of the patient was discussed with Dr. Karma Greaser and the assessment and plan was formulated with their assistance.  Please see their note for official documentation of the patient encounter.   Signed: Katha Hamming, Med Student 02/21/2015, 2:03 PM

## 2015-02-21 NOTE — ED Notes (Addendum)
Pt c/o facial swelling x 7 days and shortness of breath x 3-4 days. Pt talking in complete sentences without difficulty. Pt has not taken BP meds in 1 1/2 years. Pt does not have a PMD.

## 2015-02-21 NOTE — H&P (Signed)
Date: 02/21/2015               Patient Name:  Darin Brady MRN: [id number]  DOB: [birth year] Age / Sex: 59 y.o., male   PCP: No Pcp Per Patient         Medical Service: Internal Medicine Teaching Service         Attending Physician: Dr. Tyson Alias, MD    First Contact: Dr. Karma Greaser Pager: [phone]  Second Contact: Dr. Isabella Bowens Pager: [phone]       After Hours (After 5p/  First Contact Pager: [phone]  weekends / holidays): Second Contact Pager: [phone]   Chief Complaint: Dyspnea with exertion   History of Present Illness: Darin Brady is a 59 year male with a past medical history hypertension, ETOH abuse, tobacco abuse and questionable hyperthyroidism (patient denies) presents to [facility] ED with  complaints of shortness of breath and abdominal pain. He reports over the past 3-4 days he has had worsening dyspnea with exertion. States he cannot walk several feet without getting short of breath. At baseline, he reports no dyspnea with exertion. Reports occasional palpitations when he exerts himself. Denies any CP, orthopnea, PND, leg edema. Does report occasional non-productive cough. Denies any history of heart disease. Reports facial swelling when he wakes up in the morning that resolves during the day. He also has complaints of abdominal pain over the past several weeks. Reports that 20-30 minutes after eating a meal he has epigastric tightness with pain that radiates to his back. He feels that his stomach becomes hard and nothing goes through.' Reports infrequent bowel movements and straining with bowel movements. Denies any N/V/D, melena, hematochezia, light colored stool. Has not noted any scleral icterus, jaundice or pruritis. Denies any fevers, chills, hematuria, dysuria or urinary frequency. Reports 3-4 beers a day. Smokes 0.5 PPD.   Meds: Current Facility-Administered Medications  Medication Dose Route Frequency Provider Last Rate Last Dose  . 0.9 %  sodium chloride infusion  250  mL Intravenous PRN Lora Paula, MD      . Melene Muller ON 02/22/2015] aspirin EC tablet 81 mg  81 mg Oral Daily Lora Paula, MD      . enoxaparin (LOVENOX) injection 40 mg  40 mg Subcutaneous Q24H Lora Paula, MD   40 mg at 02/21/15 1545  . furosemide (LASIX) injection 40 mg  40 mg Intravenous BID Lora Paula, MD      . lisinopril (PRINIVIL,ZESTRIL) tablet 5 mg  5 mg Oral Daily Lora Paula, MD   5 mg at 02/21/15 1545  . senna-docusate (Senokot-S) tablet 1 tablet  1 tablet Oral QHS PRN Lora Paula, MD      . sodium chloride 0.9 % injection 3 mL  3 mL Intravenous Q12H Lora Paula, MD   3 mL at 02/21/15 1400  . sodium chloride 0.9 % injection 3 mL  3 mL Intravenous Q12H Lora Paula, MD   3 mL at 02/21/15 1400  . sodium chloride 0.9 % injection 3 mL  3 mL Intravenous PRN Lora Paula, MD        Allergies: Allergies as of 02/21/2015  . (No Known Allergies)   Past Medical History  Diagnosis Date  . Hypertension     Previous anti-hypertensive meds but ran out  . Hyperthyroidism   . Syncope   . Alcohol abuse    Past Surgical History  Procedure Laterality Date  . Clavicle surgery  15 years ago  . Stomach surgery      stop bleeding from a trauma  . Orif ankle fracture  02/03/2011    Procedure: OPEN REDUCTION INTERNAL FIXATION (ORIF) ANKLE FRACTURE;  Surgeon: Thera Flake., MD;  Location: [facility]S;  Service: Orthopedics;  Laterality: Left;   Family History  Problem Relation Age of Onset  . Asthma Mother    Social History   Social History  . Marital Status: Single    Spouse Name: N/A  . Number of Children: N/A  . Years of Education: N/A   Occupational History  . Not on file.   Social History Main Topics  . Smoking status: Current Every Day Smoker -- 0.50 packs/day  . Smokeless tobacco: Not on file  . Alcohol Use: Yes     Comment: occasionally  . Drug Use: No  . Sexual Activity: Yes   Other Topics Concern  . Not on file   Social  History Narrative   Review of Systems: Review of Systems  Constitutional: Positive for diaphoresis. Negative for fever, chills and weight loss.  Eyes: Negative for blurred vision and double vision.  Respiratory: Positive for cough and shortness of breath. Negative for sputum production and wheezing.   Cardiovascular: Positive for palpitations. Negative for chest pain, orthopnea, leg swelling and PND.  Gastrointestinal: Positive for abdominal pain and constipation. Negative for nausea, vomiting, diarrhea, blood in stool and melena.  Genitourinary: Negative for dysuria, urgency, frequency, hematuria and flank pain.  Musculoskeletal: Positive for back pain.  Skin: Negative for itching and rash.  Neurological: Negative for dizziness, focal weakness and headaches.  Endo/Heme/Allergies: Negative for polydipsia. Does not bruise/bleed easily.  Psychiatric/Behavioral: Negative for depression.   Physical Exam: Blood pressure 158/117, pulse 94, temperature 97.8 F (36.6 C), temperature source Oral, resp. rate 18, height 5\' 9"  (1.753 m), weight 131 lb 12.8 oz (59.784 kg), SpO2 100 %.  General: alert, well-developed, and cooperative to examination.  Head: normocephalic and atraumatic.  Eyes: vision grossly intact, pupils equal, pupils round, pupils reactive to light, injected and anicteric.  Mouth: pharynx pink and moist, no erythema, and no exudates.  Neck: supple, full ROM, no thyromegaly,+JVD, and no carotid bruits.  Lungs: normal respiratory effort, no accessory muscle use, normal breath sounds, + right basilar crackles, and no wheezes. Heart: normal rate, regular rhythm, +S3  Abdomen: soft, non-tender, normal bowel sounds, no distention, no guarding, no rebound tenderness, negative murphy's sign Msk: no joint swelling, no joint warmth, and no redness over joints.  Pulses: 2+ DP/PT pulses bilaterally Extremities: No cyanosis, clubbing, +1 pitting edema to knees bilaterally Neurologic: alert &  oriented X3, cranial nerves II-XII intact, strength normal in all extremities, sensation intact to light touch, and gait normal.  Skin: turgor normal and no rashes.  Psych: normal mood and affect  Lab results: Basic Metabolic Panel:  Recent Labs  44/81/85 1051  NA 140  K 3.8  CL 109  CO2 23  GLUCOSE 99  BUN 18  CREATININE 0.90  CALCIUM 8.7*   Liver Function Tests:  Recent Labs  02/21/15 1051  AST 45*  ALT 34  ALKPHOS 104  BILITOT 2.0*  PROT 6.7  ALBUMIN 3.4*    Recent Labs  02/21/15 1051  LIPASE 23   CBC:  Recent Labs  02/21/15 1051  WBC 2.7*  NEUTROABS 1.4*  HGB 12.4*  HCT 36.9*  MCV 97.6  PLT 176   Cardiac Enzymes:  Recent Labs  02/21/15 1051  TROPONINI 0.03  Urine Drug Screen: Drugs of Abuse     Component Value Date/Time   LABOPIA NONE DETECTED 06/28/2013 2327   COCAINSCRNUR NONE DETECTED 06/28/2013 2327   LABBENZ NONE DETECTED 06/28/2013 2327   AMPHETMU NONE DETECTED 06/28/2013 2327   THCU NONE DETECTED 06/28/2013 2327   LABBARB NONE DETECTED 06/28/2013 2327    Urinalysis:  Recent Labs  02/21/15 1054  COLORURINE AMBER*  LABSPEC 1.030  PHURINE 5.5  GLUCOSEU 100*  HGBUR NEGATIVE  BILIRUBINUR SMALL*  KETONESUR NEGATIVE  PROTEINUR 100*  NITRITE NEGATIVE  LEUKOCYTESUR NEGATIVE   Imaging results:  Dg Chest 2 View  02/21/2015  CLINICAL DATA:  Shortness of breath for 4 days. Abdominal pain and bloating. EXAM: CHEST  2 VIEW COMPARISON:  06/08/2014 and 06/28/2013. FINDINGS: There is progressive enlargement of the cardiac silhouette. There are increased interstitial markings with new small bilateral pleural effusions and fissural thickening bilaterally. There is no confluent airspace opacity or pneumothorax. The bones appear unchanged. IMPRESSION: Increased cardiomegaly, pulmonary edema and bilateral pleural effusions suspicious for congestive heart failure. Electronically Signed   By: Carey Bullocks M.D.   On: 02/21/2015 10:15    Other results: EKG: Normal sinus rhythm. Possible Left atrial enlargement. Right axis deviation. Left ventricular hypertrophy. T wave abnormality, consider inferolateral ischemia. Prolonged QT.  Assessment & Plan by Problem: Mr. Kader is a 59 year old man with a history of HTN, ETOH and tobacco abuse presenting with 3-4 history of dyspnea with exertion and epigastric pain following meals.  Likely undiagnosed CHF acute exacerbation: Patient with 3-4 day history of progressive dyspnea with exertion. Denies any previous cardiac history. No orthopnea or PND. Denies any leg edema but does have 1+ pitting edema bilaterally on exam. CXR with marked progressive enlargement of his cardiac silhouette compared to 05/2014 CXR with pulmonary edema and bilateral pleural effusions. BNP on admission was 1800. Symptoms consistent with undiagnosed CHF. EKG with with T wave inversions in inferolateral leads that were present on previous EKGs. Troponin negative x1.  -ECHO -Lasix 40 mg IV bid -Cycle troponins x3 -EKG in am -Lipid panel in am -A1c -BMP and in CBC in am -Consider Cards consult in am pending ECHO results -Lisinopril 5 mg daily -ASA 81 mg daily -Strict I&O and daily weights  Epigastric pain: Patient reports abdominal fullness and discomfort 20-30 minutes following meals. Pain radiates to back. Lipase is wnl. No abdominal tenderness on exam. Negative murphy's sign. Denies any n/v/d. Does report some constipation. -RUQ [place] -Senokot-S prn  HTN: Patient with history of HTN on no medications for the past 2 years after moving to [facility] and not establishing with any PCP. Previously on Lisinopril and HCTZ. BP elevated to 140-150s systolic in ED. -Start Lisinopril 5 mg daily  -Consider BB outpatient after resolution of acute exacerbation  Leukopenia: WBC 2.7 today. 4.5 8 months ago and 3.8 a year ago. -Checking HIV -CBC in am  ?able hyperthyroidism: History of hyperthyroidism noted in chart.  Patient denies any history. -Check TSH  ETOH abuse: Admits 3-4 beers daily. Denies any history of withdrawal or seizures. -CIWA protocol   FEN: NPO at midnight  DVT PPx: Lovenox  Dispo: Disposition is deferred at this time, awaiting improvement of current medical problems.   The patient does not have a current PCP (No Pcp Per Patient) and does need an [facility] hospital follow-up appointment after discharge.  The patient does have transportation limitations that hinder transportation to clinic appointments.  Signed: Valentino Nose, MD 02/21/2015, 4:59 PM

## 2015-02-21 NOTE — ED Provider Notes (Signed)
CSN: [id number]     Arrival date & time 02/21/15  0900 History   First MD Initiated Contact with Patient 02/21/15 [phone]     Chief Complaint  Patient presents with  . Shortness of Breath  . Facial Swelling     (Consider location/radiation/quality/duration/timing/severity/associated sxs/prior Treatment) HPI Patient denies any known history of heart or lung disease. He reports he has been getting shortness of breath for about 3-4 days. He reports now he has shortness of breath just walking across his bedroom. At baseline he reports he can be active without difficulty. No chest pain associated. No fever or chills. Occasional cough. No significant sputum production. He denies lower extremity swelling. Patient reports he is getting up pain and fullness in his epigastric region. He reports for the past week now if he tries to eat something it feels like it doesn't pass through him. This then causes pain all through his epigastric region and into his back. He denies urinary symptoms. He denies history of peptic ulcer disease. Patient estimates he drinks about 3-4 beers a day. Past Medical History  Diagnosis Date  . Hypertension     Previous anti-hypertensive meds but ran out  . Hyperthyroidism   . Syncope   . Alcohol abuse    Past Surgical History  Procedure Laterality Date  . Clavicle surgery      15 years ago  . Stomach surgery      stop bleeding from a trauma  . Orif ankle fracture  02/03/2011    Procedure: OPEN REDUCTION INTERNAL FIXATION (ORIF) ANKLE FRACTURE;  Surgeon: Thera Flake., MD;  Location: [facility];  Service: Orthopedics;  Laterality: Left;   Family History  Problem Relation Age of Onset  . Asthma Mother    Social History  Substance Use Topics  . Smoking status: Current Every Day Smoker -- 0.50 packs/day  . Smokeless tobacco: None  . Alcohol Use: Yes     Comment: occasionally    Review of Systems 10 Systems reviewed and are negative for acute change except as noted  in the HPI.    Allergies  Review of patient's allergies indicates no known allergies.  Home Medications   Prior to Admission medications   Medication Sig Start Date End Date Taking? Authorizing Provider  azithromycin (ZITHROMAX) 250 MG tablet Take 1 tablet (250 mg total) by mouth daily. Take first 2 tablets together, then 1 every day until finished. Patient not taking: Reported on 02/21/2015 06/09/14   Linwood Dibbles, MD  guaiFENesin (MUCINEX) 600 MG 12 hr tablet Take 600 mg by mouth 2 (two) times daily as needed for cough or to loosen phlegm.    Historical Provider, MD  lisinopril-hydrochlorothiazide (PRINZIDE,ZESTORETIC) 10-12.5 MG per tablet Take 1 tablet by mouth daily. Patient not taking: Reported on 02/21/2015 06/29/13   Garlon Hatchet, PA-C  predniSONE (DELTASONE) 20 MG tablet Take 3 tablets (60 mg total) by mouth daily. Patient not taking: Reported on 02/21/2015 06/09/14   Linwood Dibbles, MD   BP 141/109 mmHg  Pulse 85  Temp(Src) 97.7 F (36.5 C) (Oral)  Resp 18  Ht  (1.753 m)  Wt 138 lb 2 oz (62.653 kg)  BMI 20.39 kg/m2  SpO2 96% Physical Exam  Constitutional: He is oriented to person, place, and time. He appears well-developed and well-nourished.  Patient is slender. He is nontoxic and alert. No respiratory distress at rest.  HENT:  Head: Normocephalic and atraumatic.  Mouth/Throat: Oropharynx is clear and moist.  Eyes: EOM  are normal. Pupils are equal, round, and reactive to light.  Neck: Neck supple.  Cardiovascular: Normal rate, regular rhythm and intact distal pulses.   Split S2.  Pulmonary/Chest: Effort normal. He has no rales.  Basilar rales on the right.  Abdominal: Soft. Bowel sounds are normal. He exhibits no distension. There is tenderness.  Patient endorses tenderness to palpation in the epigastrium. No guarding or rebound. He has a well healed midline surgical scar. He reports this is from distant trauma.  Musculoskeletal: Normal range of motion. He exhibits  edema.  1+ pitting edema bilateral lower extremity's.  Neurological: He is alert and oriented to person, place, and time. He has normal strength. He exhibits normal muscle tone. Coordination normal. GCS eye subscore is 4. GCS verbal subscore is 5. GCS motor subscore is 6.  Skin: Skin is warm, dry and intact.  Psychiatric: He has a normal mood and affect.    ED Course  Procedures (including critical care time) Labs Review Labs Reviewed  COMPREHENSIVE METABOLIC PANEL - Abnormal; Notable for the following:    Calcium 8.7 (*)    Albumin 3.4 (*)    AST 45 (*)    Total Bilirubin 2.0 (*)    All other components within normal limits  BRAIN NATRIURETIC PEPTIDE - Abnormal; Notable for the following:    B Natriuretic Peptide 1803.7 (*)    All other components within normal limits  CBC WITH DIFFERENTIAL/PLATELET - Abnormal; Notable for the following:    WBC 2.7 (*)    RBC 3.78 (*)    Hemoglobin 12.4 (*)    HCT 36.9 (*)    Neutro Abs 1.4 (*)    All other components within normal limits  URINALYSIS, ROUTINE W REFLEX MICROSCOPIC (NOT AT [facility]) - Abnormal; Notable for the following:    Color, Urine AMBER (*)    Glucose, UA 100 (*)    Bilirubin Urine SMALL (*)    Protein, ur 100 (*)    All other components within normal limits  URINE MICROSCOPIC-ADD ON - Abnormal; Notable for the following:    Squamous Epithelial / LPF 0-5 (*)    Bacteria, UA RARE (*)    Casts GRANULAR CAST (*)    All other components within normal limits  LIPASE, BLOOD  TROPONIN I  TROPONIN I  TROPONIN I  HEMOGLOBIN A1C  TSH    Imaging Review Dg Chest 2 View  02/21/2015  CLINICAL DATA:  Shortness of breath for 4 days. Abdominal pain and bloating. EXAM: CHEST  2 VIEW COMPARISON:  06/08/2014 and 06/28/2013. FINDINGS: There is progressive enlargement of the cardiac silhouette. There are increased interstitial markings with new small bilateral pleural effusions and fissural thickening bilaterally. There is no confluent  airspace opacity or pneumothorax. The bones appear unchanged. IMPRESSION: Increased cardiomegaly, pulmonary edema and bilateral pleural effusions suspicious for congestive heart failure. Electronically Signed   By: Carey Bullocks M.D.   On: 02/21/2015 10:15   I have personally reviewed and evaluated these images and lab results as part of my medical decision-making.   EKG Interpretation   Date/Time:  Monday February 21 2015 09:03:48 EST Ventricular Rate:  93 PR Interval:  160 QRS Duration: 102 QT Interval:  388 QTC Calculation: 482 R Axis:   167 Text Interpretation:  Normal sinus rhythm Possible Left atrial enlargement  Right axis deviation Left ventricular hypertrophy T wave abnormality,  consider inferolateral ischemia Prolonged QT Abnormal ECG Confirmed by  ZAVITZ  MD, JOSHUA ([id number]) on 02/21/2015 9:09:55 AM  MDM   Final diagnoses:  Acute congestive heart failure, unspecified congestive heart failure type [facility])   Patient presents with increasing exertional dyspnea. At this time findings are consistent with congestive heart failure. Chest x-ray shows vascular overload patient does have peripheral edema. Patient will be admitted for diuresis and further diagnostic studies. Patient does not have a known prior history of congestive heart failure or cardiac disease.    Arby Barrette, MD 02/21/15 [phone]

## 2015-02-22 ENCOUNTER — Encounter (HOSPITAL_COMMUNITY): Payer: Self-pay | Admitting: Student

## 2015-02-22 ENCOUNTER — Inpatient Hospital Stay (HOSPITAL_COMMUNITY): Payer: Self-pay

## 2015-02-22 DIAGNOSIS — I110 Hypertensive heart disease with heart failure: Secondary | ICD-10-CM

## 2015-02-22 DIAGNOSIS — I5022 Chronic systolic (congestive) heart failure: Secondary | ICD-10-CM | POA: Insufficient documentation

## 2015-02-22 DIAGNOSIS — I509 Heart failure, unspecified: Secondary | ICD-10-CM

## 2015-02-22 DIAGNOSIS — I5021 Acute systolic (congestive) heart failure: Principal | ICD-10-CM

## 2015-02-22 LAB — BASIC METABOLIC PANEL
Anion gap: 9 (ref 5–15)
BUN: 17 mg/dL (ref 6–20)
CO2: 29 mmol/L (ref 22–32)
Calcium: 8.4 mg/dL — ABNORMAL LOW (ref 8.9–10.3)
Chloride: 103 mmol/L (ref 101–111)
Creatinine, Ser: 0.95 mg/dL (ref 0.61–1.24)
GFR calc Af Amer: >60 mL/min (ref >60)
GLUCOSE: 97 mg/dL (ref 65–99)
POTASSIUM: 3.2 mmol/L — AB (ref 3.5–5.1)
Sodium: 141 mmol/L (ref 135–145)

## 2015-02-22 LAB — CBC
HEMATOCRIT: 37.5 % — AB (ref 39.0–52.0)
Hemoglobin: 12.6 g/dL — ABNORMAL LOW (ref 13.0–17.0)
MCH: 32.2 pg (ref 26.0–34.0)
MCHC: 33.6 g/dL (ref 30.0–36.0)
MCV: 95.9 fL (ref 78.0–100.0)
Platelets: 202 10*3/uL (ref 150–400)
RBC: 3.91 MIL/uL — ABNORMAL LOW (ref 4.22–5.81)
RDW: 15 % (ref 11.5–15.5)
WBC: 3.9 10*3/uL — ABNORMAL LOW (ref 4.0–10.5)

## 2015-02-22 LAB — HEMOGLOBIN A1C
HEMOGLOBIN A1C: 5.7 % — AB (ref 4.8–5.6)
Mean Plasma Glucose: 117 mg/dL

## 2015-02-22 LAB — LIPID PANEL
Cholesterol: 92 mg/dL (ref 0–200)
HDL: 27 mg/dL — ABNORMAL LOW (ref >40)
LDL CALC: 48 mg/dL (ref 0–99)
TRIGLYCERIDES: 87 mg/dL (ref <150)
Total CHOL/HDL Ratio: 3.4 RATIO
VLDL: 17 mg/dL (ref 0–40)

## 2015-02-22 LAB — TROPONIN I: Troponin I: 0.04 ng/mL — ABNORMAL HIGH (ref <0.031)

## 2015-02-22 LAB — HIV ANTIBODY (ROUTINE TESTING W REFLEX): HIV SCREEN 4TH GENERATION: NONREACTIVE

## 2015-02-22 LAB — MAGNESIUM: MAGNESIUM: 1.7 mg/dL (ref 1.7–2.4)

## 2015-02-22 MED ORDER — CARVEDILOL 3.125 MG PO TABS
3.1250 mg | ORAL_TABLET | Freq: Two times a day (BID) | ORAL | Status: DC
  Administered 2015-02-22 – 2015-02-24 (×3): 3.125 mg via ORAL
  Filled 2015-02-22 (×4): qty 1

## 2015-02-22 MED ORDER — FUROSEMIDE 10 MG/ML IJ SOLN
40.0000 mg | Freq: Every day | INTRAMUSCULAR | Status: DC
  Administered 2015-02-23: 40 mg via INTRAVENOUS
  Filled 2015-02-22: qty 4

## 2015-02-22 MED ORDER — POTASSIUM CHLORIDE CRYS ER 20 MEQ PO TBCR
40.0000 meq | EXTENDED_RELEASE_TABLET | Freq: Once | ORAL | Status: AC
  Administered 2015-02-22: 40 meq via ORAL
  Filled 2015-02-22: qty 2

## 2015-02-22 MED ORDER — REGADENOSON 0.4 MG/5ML IV SOLN
0.4000 mg | Freq: Once | INTRAVENOUS | Status: DC
  Filled 2015-02-22: qty 5

## 2015-02-22 NOTE — Progress Notes (Signed)
Pt steady on feet, A/Ox4, states he does not need bed alarm on or assistance to bathroom.

## 2015-02-22 NOTE — Consult Note (Signed)
CARDIOLOGY CONSULT NOTE   Patient ID: Darin Brady MRN: [id number] DOB/AGE: 10-17-1955 59 y.o.  Admit date: 02/21/2015  Primary Physician   No PCP Per Patient Primary Cardiologist   Dr. Allyson Sabal Reason for Consultation   New onset systolic CHF.  HPI: Darin Brady is a 59 y.o. male with a history of HTN, alcohol abuse, tobacco abuse, and previous syncope who presented to [facility] on 02/21/15 with progressive SOB and found to have new onset acute systolic CHF.   He has been seen by Dr. Allyson Sabal in the office on 07/08/13 after an syncopal episode while intoxicated. Dr. Allyson Sabal recommended a cardiac monitor and 2D ECHO, but I don't see that this was ever done.   He presented to [facility] ED on 02/21/15 with progressive dyspnea x 4 days as well as dyspnea on exertion. No chest pain, palpitations or LE edema, but he did complain of orthopnea and PND. 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 However, over the past 4 days he has not been able to work well due to dysnpnea. He tried to tough it out but "couldn't take it anymore" and presented to the ED.  He had previously been on antihypertensives but he has not taken any of these for over 2 years. He occasionally takes his BP at home with his brother's BP cuff and it usually runs high, mostly on the bottom number. He drinks 2 beers on work days and ~4 beers Thurs-Sun. He smokes ~ 1/2 PPD for the past 30 years.   On admission, BNP was 1803.7 and CXR showed cardiomegaly, pulmonary edema and bilateral pleural effusions. Troponin 0.03>0.04>0.04. He was started on IV lasix with good diuresis. 2D ECHO was ordered which revealed LV EF: 20-25% LV severely dilated, akinesis of the entire inferior/inferoseptal myocardium, basal-midanteroseptal myocardium, apicalanterior myocardium. Features are consistent with a pseudonormal left ventricular filling pattern, G2DD andhigh ventricular filling pressure. Aortic  valve sclerosis, mild MR, severe LA dilation. A PFO could not be excluded and agitated saline contrast study to rule out PFO recommended.   He was started on IV lasix 40mg  BID with brisk diuresis. He has clinically improved.    Past Medical History  Diagnosis Date  . Hypertension     Previous anti-hypertensive meds but ran out  . Hyperthyroidism   . Syncope   . Alcohol abuse      Past Surgical History  Procedure Laterality Date  . Clavicle surgery      15 years ago  . Stomach surgery      stop bleeding from a trauma  . Orif ankle fracture  02/03/2011    Procedure: OPEN REDUCTION INTERNAL FIXATION (ORIF) ANKLE FRACTURE;  Surgeon: Thera Flake., MD;  Location: [facility] ORS;  Service: Orthopedics;  Laterality: Left;    No Known Allergies  I have reviewed the patient's current medications . aspirin EC  81 mg Oral Daily  . enoxaparin (LOVENOX) injection  40 mg Subcutaneous Q24H  . folic acid  1 mg Oral Daily  . [START ON 02/23/2015] furosemide  40 mg Intravenous Daily  . lisinopril  5 mg Oral Daily  . multivitamin with minerals  1 tablet Oral Daily  . sodium chloride  3 mL Intravenous Q12H  . sodium chloride  3 mL Intravenous Q12H  . thiamine  100 mg Oral Daily     sodium chloride, LORazepam **OR** [DISCONTINUED] LORazepam, senna-docusate, sodium chloride  Prior to  Admission medications   Medication Sig Start Date End Date Taking? Authorizing Provider  azithromycin (ZITHROMAX) 250 MG tablet Take 1 tablet (250 mg total) by mouth daily. Take first 2 tablets together, then 1 every day until finished. Patient not taking: Reported on 02/21/2015 06/09/14   Linwood Dibbles, MD  guaiFENesin (MUCINEX) 600 MG 12 hr tablet Take 600 mg by mouth 2 (two) times daily as needed for cough or to loosen phlegm.    Historical Provider, MD  lisinopril-hydrochlorothiazide (PRINZIDE,ZESTORETIC) 10-12.5 MG per tablet Take 1 tablet by mouth daily. Patient not taking: Reported on 02/21/2015 06/29/13   Garlon Hatchet, PA-C  predniSONE (DELTASONE) 20 MG tablet Take 3 tablets (60 mg total) by mouth daily. Patient not taking: Reported on 02/21/2015 06/09/14   Linwood Dibbles, MD     Social History   Social History  . Marital Status: Single    Spouse Name: N/A  . Number of Children: N/A  . Years of Education: N/A   Occupational History  . Not on file.   Social History Main Topics  . Smoking status: Current Every Day Smoker -- 0.50 packs/day  . Smokeless tobacco: Not on file  . Alcohol Use: Yes     Comment: occasionally  . Drug Use: No  . Sexual Activity: Yes   Other Topics Concern  . Not on file   Social History Narrative    No family status information on file.   Family History  Problem Relation Age of Onset  . Asthma Mother      ROS:  Full 14 point review of systems complete and found to be negative unless listed above.  Physical Exam: Blood pressure 124/87, pulse 93, temperature 98.4 F (36.9 C), temperature source Oral, resp. rate 16, height 5\' 9"  (1.753 m), weight 120 lb 4.8 oz (54.568 kg), SpO2 99 %.  General: Well developed, well nourished, male in no acute distress. Thin and frail appearing Head: Eyes PERRLA, No xanthomas.   Normocephalic and atraumatic, oropharynx without edema or exudate. Dentition: poor Lungs: CTAB Heart: HRRR S1 S2, no rub/gallop, Heart regular rate and rhythm with S1, S2  murmur. pulses are 2+ extrem.   Neck: No carotid bruits. No lymphadenopathy.  No JVD. Abdomen: Bowel sounds present, abdomen soft and non-tender without masses or hernias noted. Msk:  No spine or cva tenderness. No weakness, no joint deformities or effusions. Extremities: No clubbing or cyanosis. No edema.  Neuro: Alert and oriented X 3. No focal deficits noted. Psych:  Good affect, responds appropriately Skin: No rashes or lesions noted.  Labs:   Lab Results  Component Value Date   WBC 3.9* 02/22/2015   HGB 12.6* 02/22/2015   HCT 37.5* 02/22/2015   MCV 95.9 02/22/2015   PLT  202 02/22/2015   No results for input(s): INR in the last 72 hours.   Recent Labs Lab 02/21/15 1051 02/22/15 0340  NA 140 141  K 3.8 3.2*  CL 109 103  CO2 23 29  BUN 18 17  CREATININE 0.90 0.95  CALCIUM 8.7* 8.4*  PROT 6.7  --   BILITOT 2.0*  --   ALKPHOS 104  --   ALT 34  --   AST 45*  --   GLUCOSE 99 97  ALBUMIN 3.4*  --    MAGNESIUM  Date Value Ref Range Status  02/22/2015 1.7 1.7 - 2.4 mg/dL Final    Recent Labs  00/34/91 1051 02/21/15 1708 02/21/15 2320  TROPONINI 0.03 0.04* 0.04*  Lab Results  Component Value Date   CHOL 92 02/22/2015   HDL 27* 02/22/2015   LDLCALC 48 02/22/2015   TRIG 87 02/22/2015   No results found for: [facility] LIPASE  Date/Time Value Ref Range Status  02/21/2015 10:51 AM 23 11 - 51 U/L Final   TSH  Date/Time Value Ref Range Status  02/21/2015 05:08 PM 0.929 0.350 - 4.500 uIU/mL Final     Echo: 02/22/2015 LV EF: 20% - 25% Study Conclusions - Left ventricle: The cavity size was severely dilated. Systolic function was severely reduced. The estimated ejection fraction was in the range of 20% to 25%. There is akinesis of the entireinferior and inferoseptal myocardium. There is akinesis of the basal-midanteroseptal myocardium. There is akinesis of the apicalanterior myocardium. Features are consistent with a pseudonormal left ventricular filling pattern, with concomitant abnormal relaxation and increased filling pressure (grade 2 diastolic dysfunction). Doppler parameters are consistent with high ventricular filling pressure. - Aortic valve: Moderate thickening and calcification, consistent with sclerosis. - Mitral valve: Calcified annulus. There was mild regurgitation. - Left atrium: The atrium was severely dilated. - Atrial septum: A patent foramen ovale cannot be excluded. - Pericardium, extracardiac: A trivial pericardial effusion was identified posterior to the heart. - Recommendations:  Agitated saline contrast study to rule out PFO.  ECG:  NSR: HR 93. TWI in inferolateral leads similar to previous.   Radiology:  Dg Chest 2 View  02/21/2015  CLINICAL DATA:  Shortness of breath for 4 days. Abdominal pain and bloating. EXAM: CHEST  2 VIEW COMPARISON:  06/08/2014 and 06/28/2013. FINDINGS: There is progressive enlargement of the cardiac silhouette. There are increased interstitial markings with new small bilateral pleural effusions and fissural thickening bilaterally. There is no confluent airspace opacity or pneumothorax. The bones appear unchanged. IMPRESSION: Increased cardiomegaly, pulmonary edema and bilateral pleural effusions suspicious for congestive heart failure. Electronically Signed   By: Carey Bullocks M.D.   On: 02/21/2015 10:15   US Abdomen Limited Ruq  02/21/2015  CLINICAL DATA:  Epigastric pain for 1.5 weeks. EXAM: US ABDOMEN LIMITED - RIGHT UPPER QUADRANT COMPARISON:  None. FINDINGS: Gallbladder: No gallstones or wall thickening visualized. No sonographic Murphy sign noted. Common bile duct: Diameter: 4.2 mm, normal Liver: No focal lesion identified. Within normal limits in parenchymal echogenicity. IMPRESSION: Normal examination.  No evidence of cholelithiasis or cholecystitis. Electronically Signed   By: Burman Nieves M.D.   On: 02/21/2015 20:10    ASSESSMENT AND PLAN:    Active Problems:   Acute CHF (congestive heart failure) (HCC)   Epigastric pain   Darin Brady is a 59 y.o. male with a history of HTN, alcohol abuse, tobacco abuse, and previous syncope who presented to [facility] on 02/21/15 with progressive SOB and found to have new onset acute systolic CHF.   Acute systolic CHF- on admission, BNP was 1803.7 and CXR showed cardiomegaly, pulmonary edema and bilateral pleural effusions.  -- 2D ECHO was ordered which revealed LV EF: 20-25% LV severely dilated, akinesis of the entire inferior/inferoseptal myocardium, basal-midanteroseptal myocardium,  apicalanterior myocardium. Features are consistent with a pseudonormal left ventricular filling pattern, G2DD andhigh ventricular filling pressure.  -- Troponin 0.03>0.04>0.04 likely demand ischemia in the setting of acute CHF, but we will need to pursue an ischemic w/up. Likely from uncontrolled HTN and alcohol abuse but ischemia should be ruled out. Will plan for a myoview.  -- Started on lisinopril  qd. No BB due to acute exacerbation. Plans to add this when stable. -- Started  on IV lasix  BID with brisk diuresis. Net neg 5.2 L. Weight down 25 lbs? Doubt this much but weight appears to be trending downwards.   Uncontrolled HTN- he had previously been on antihypertensives but he has not taken any of these for over 2 years. -- BPs better controlled on lisinopril  and IV lasix.   Alcohol/tobacco abuse- he drinks 2 beers on work days and ~4 beers Thurs-Sun and has a 30 pack year hx of smoking.  -- Counseled on cessation   Possible PFO- a PFO could not be excluded on 2D ECHO and an agitated saline contrast study to rule out PFO was recommended.     SignedAllena Katz 02/22/2015 1:49 PM  Pager [phone]  Co-Sign MD  Attending Note:   The patient was seen and examined.  Agree with assessment and plan as noted above.  Changes made to the above note as needed.  Pt has a new diagnosed systolic CHF .  Likely due to uncontroled HTN and possibly ETOH. He has done well.   Has diuresed > 5 liters since yesterday  Agree with ACE-I. I think we can also start low dose Coreg 3.125 BID  Would do a myoview to assess him for CAD .   He has not had any CP so this could be done in the office also     Vesta Mixer, Montez Hageman., MD, [facility] 02/22/2015, 3:16 PM [address]  [address] Office [phone] Pager [phone]

## 2015-02-22 NOTE — Care Management Note (Signed)
Case Management Note  Patient Details  Name: Darin Brady MRN: [id number] Date of Birth: Aug 10, 1955  Subjective/Objective:      Admitted with Acute CHF            Action/Plan: Patient is independent of all ADL's, lives at home with his family. Works full time at [employer]- he just recently started his new job and his insurance/ benefits will not be active until 90 days ( ? Mid to the latter part of January). Np PCP; Patient is agreeable to go to the [facility] and [facility] for primary care. Cm also informed patient that he can get his prescriptions filled there also. CM will continue to follow for DCP. Alexis Goodell [phone]  Expected Discharge Date:    possible   02/25/2015            Expected Discharge Plan:  Home/Self Care  In-House Referral:   Financial Counselor  Discharge planning Services  CM Consult, [facility]  Status of Service:  In process, will continue to follow  Reola Mosher [phone] 02/22/2015, 9:58 AM

## 2015-02-22 NOTE — Progress Notes (Signed)
Subjective: No acute events overnight. Patient feels much better this morning, specifically he feels less SOB than he did on admission and has not had cough overnight. He was able to walk to the bathroom without getting short of breath and this morning he did not have any facial swelling. He last ate last night and did not experience abdominal pain or fullness at that time.  Objective: Vital signs in last 24 hours: Filed Vitals:   02/22/15 0035 02/22/15 0447 02/22/15 0535 02/22/15 0749  BP: 117/78 136/96  137/99  Pulse: 87 86  88  Temp:  98.5 F (36.9 C)  98.4 F (36.9 C)  TempSrc:  Oral  Oral  Resp: Height:      Weight:   54.568 kg (120 lb 4.8 oz)   SpO2: 98% 100%  98%   Weight change:   Intake/Output Summary (Last 24 hours) at 02/22/15 0901 Last data filed at 02/22/15 4540  Gross per 24 hour  Intake    580 ml  Output   4275 ml  Net  -3695 ml   Physical Exam: General: Thin African American man laying in bed, alert, cooperative, in NAD HEENT: EOMI, PERRL, sclera anicteric, MMM, oropharynx without erythema or exudate, no facial swelling noted  Cardiovascular: Regular rate and rhythm, S3 best appreciated at the mitral position, JVD to the level of the mandible Pulm: Clear to auscultation bilaterally, no wheezing or crackles, normal respiratory effort GI: non-distended, no tenderness, no rebound, +BS, negative Murphey's sign, healed midline surgical scar Back: No paraspinal tenderness, no CVA tenderness Extremities: 1+ pitting edema to mid shin bilaterally, peripheral pulses 2+ in the upper extremities bilaterally and 1+ in the LEs, no clubbing or cyanosis Skin: Warm and dry, no jaundice  Lab Results: Troponin 0.03>0.04>0.04  Na 141 K 3.2  Mg 1.7  Cholesterol 92 HDL 27 LDL 48  WBC 3.9 RBC 3.91 Hgb 12.6 Pl 202  Micro Results: No results found for this or any previous visit (from the past 240 hour(s)). Studies/Results: Dg Chest 2 View  02/21/2015   CLINICAL DATA:  Shortness of breath for 4 days. Abdominal pain and bloating. EXAM: CHEST  2 VIEW COMPARISON:  06/08/2014 and 06/28/2013. FINDINGS: There is progressive enlargement of the cardiac silhouette. There are increased interstitial markings with new small bilateral pleural effusions and fissural thickening bilaterally. There is no confluent airspace opacity or pneumothorax. The bones appear unchanged. IMPRESSION: Increased cardiomegaly, pulmonary edema and bilateral pleural effusions suspicious for congestive heart failure. Electronically Signed   By: Carey Bullocks M.D.   On: 02/21/2015 10:15   US Abdomen Limited Ruq  02/21/2015  CLINICAL DATA:  Epigastric pain for 1.5 weeks. EXAM: US ABDOMEN LIMITED - RIGHT UPPER QUADRANT COMPARISON:  None. FINDINGS: Gallbladder: No gallstones or wall thickening visualized. No sonographic Murphy sign noted. Common bile duct: Diameter: 4.2 mm, normal Liver: No focal lesion identified. Within normal limits in parenchymal echogenicity. IMPRESSION: Normal examination.  No evidence of cholelithiasis or cholecystitis. Electronically Signed   By: Burman Nieves M.D.   On: 02/21/2015 20:10   Medications: I have reviewed the patient's current medications. Scheduled Meds: . aspirin EC  81 mg Oral Daily  . enoxaparin (LOVENOX) injection  40 mg Subcutaneous Q24H  . folic acid  1 mg Oral Daily  . furosemide  40 mg Intravenous BID  . lisinopril  5 mg Oral Daily  . multivitamin with minerals  1 tablet Oral Daily  . sodium chloride  3 mL  Intravenous Q12H  . sodium chloride  3 mL Intravenous Q12H  . thiamine  100 mg Oral Daily   Or  . thiamine  100 mg Intravenous Daily   Continuous Infusions:  PRN Meds:.sodium chloride, LORazepam **OR** [DISCONTINUED] LORazepam, senna-docusate, sodium chloride Assessment/Plan: Active Problems:   Acute CHF (congestive heart failure) (HCC)   Epigastric pain Or Rabelo is a 59yo man with a PMH significant for untreated HTN  and alcohol abuse who presents with four days of worsening dyspnea on exertion, most likely secondary to newly diagnosed HF.  Acute CHF: Patient presented with four days of increasing dyspnea on exertion. On admission, BNP was 1803.7 and CXR showed increased cardiomegaly, pulmonary edema and bilateral pleural effusions. Patient has HTN and has not taken any medication for the past two years. Troponins 0.03>0.04>0.04 and EKG was unchanged from prior. His chart mentions a history of hyperthyroidism but the patient denies this and TSH was 0.929 on 12/12. Patient had 4.3L of urine output on 40mg  IV lasix BID and weight is down 54.568kg from 59.784kg yesterday with decreased lung crackles and LE edema.  -Consult cardiology.  -Echocardiogram today. If HFrEF consider cardiac cath or stress test given evidence of past MI of EKG.  -81mg  ASA and lisinopril 5mg  daily. -Decrease lasix to 40mg  daily and transition to oral prior to discharge.  -F/u A1C.   Abdominal fullness: Patient with history of post-prandial epigastric/back pain. Admission AST was 45 and T.bili was 2.0. Lipase was wnl. RUQ US showed no evidence of cholelithiasis or cholecystitis. Differential includes biliary colic, most likely given history of pain after meals and slightly elevated T.bili, and chronic pancreatitis, possible given history of achohol use. -CMP tomorrow.  HTN: Patient with a h/o HTN, untreated for two years. BP currently 137/99.  -Lisinopril as above.  Leukopenia: Patient with WBC of 3.9 today, and was 3.8 61yr ago. HIV negative. May be related to alcohol abuse.  -Consider further evaluation outpatient.   ? H/o htperthyroidism: Patient denies. TSH and TSH was 0.929 on 12/12.   EtOH abuse: Patient admits to drinking 3-4 beers daily and denies any history of complicated withdrawal or seizures. -CIWA protocol.   Diet: NPO for echocardiogram DVT PPx: Lovenox Dispo: Admit for management of HF and investigation of other  complaints. Anticipate discharge in 1-2 days.  This is a Psychologist, occupational Note.  The care of the patient was discussed with Dr. Karma Greaser and the assessment and plan formulated with their assistance.  Please see their attached note for official documentation of the daily encounter.   LOS: 1 day   Katha Hamming, Med Student 02/22/2015, 9:01 AM

## 2015-02-22 NOTE — Progress Notes (Signed)
Subjective: Patient with no complaints this morning. SOB has resolved. Denies any chest pain.   Objective: Vital signs in last 24 hours: Filed Vitals:   02/22/15 0447 02/22/15 0535 02/22/15 0749 02/22/15 1331  BP: 136/96  137/99 124/87  Pulse: 86  88 93  Temp: 98.5 F (36.9 C)  98.4 F (36.9 C) 98.4 F (36.9 C)  TempSrc: Oral  Oral Oral  Resp: Height:      Weight:  120 lb 4.8 oz (54.568 kg)    SpO2: 100%  98% 99%   Weight change:   Intake/Output Summary (Last 24 hours) at 02/22/15 1558 Last data filed at 02/22/15 1451  Gross per 24 hour  Intake    702 ml  Output   5725 ml  Net  -5023 ml    PHYSICAL EXAM GENERAL- alert, co-operative, appears as stated age, not in any distress. HEENT- Atraumatic, normocephalic, PERRL, EOMI, oral mucosa appears moist CARDIAC- RRR, no murmurs, rubs or gallops. RESP- Moving equal volumes of air, and clear to auscultation bilaterally, no wheezes or crackles. ABDOMEN- Soft, nontender, bowel sounds present. NEURO- No obvious Cr N abnormality. EXTREMITIES- pulse 2+, symmetric, trace pedal edema. SKIN- Warm, dry, No rash or lesion. PSYCH- Normal mood and affect, appropriate thought content and speech.  Medications: I have reviewed the patient's current medications. Scheduled Meds: . aspirin EC  81 mg Oral Daily  . enoxaparin (LOVENOX) injection  40 mg Subcutaneous Q24H  . folic acid  1 mg Oral Daily  . [START ON 02/23/2015] furosemide  40 mg Intravenous Daily  . lisinopril  5 mg Oral Daily  . multivitamin with minerals  1 tablet Oral Daily  . sodium chloride  3 mL Intravenous Q12H  . sodium chloride  3 mL Intravenous Q12H  . thiamine  100 mg Oral Daily   Continuous Infusions:  PRN Meds:.sodium chloride, LORazepam **OR** [DISCONTINUED] LORazepam, senna-docusate, sodium chloride Assessment/Plan:  Acute systolic CHF: Patient with net 3.6 L out overnight. Weight 131 lb to 120 lbs this morning. Denies any further dyspnea. Leg  edema improving. ECHO today with LV EFF 20-25%, LV severely dilated, akinesis of the entire inferior/inferoseptal myocardium, basal-midanteroseptal myocardium, apicalanterior myocardium. Troponin 0.03->0.04->0.04, likely demand ischemia from acute exacerbation. Likely 2/2 uncontrolled HTN and ETOH abuse.  -Appreciate cards recs -Decrease lasix to 40 mg IV daily -A1c 5.7 -Lisinopril 5 mg daily -ASA 81 mg daily -Myoview to assess for CAD -Coreg 3.125 mg PO bid -Strict I&O and daily weights  Epigastric pain: Patient reports abdominal fullness and discomfort 20-30 minutes following meals. Pain radiates to back. Lipase is wnl. No abdominal tenderness on exam. Negative murphy's sign. Denies any n/v/d. Does report some constipation. -RUQ US unremarkable -Senokot-S prn  HTN: Patient with history of HTN on no medications for the past 2 years after moving to [facility] and not establishing with any PCP. Previously on Lisinopril and HCTZ. BP stable.  -Continue Lisinopril 5 mg daily  -Coreg 3.125 mg bid  Leukopenia: WBC 3.9 today improved form 2.7 yesterday.  -HIV negative -CBC in am  ?able hyperthyroidism: History of hyperthyroidism noted in chart. Patient denies any history. -TSH wnl  ETOH abuse: Admits 3-4 beers daily. Denies any history of withdrawal or seizures. -CIWA protocol   FEN: HH diet, NPO at midnight  DVT PPx: Lovenox  Dispo: Disposition is deferred at this time, awaiting improvement of current medical problems.  Anticipated discharge in approximately 1-2 day(s).   The patient does not have a  current PCP (No Pcp Per Patient) and does need an [facility] hospital follow-up appointment after discharge.  The patient does not have transportation limitations that hinder transportation to clinic appointments.  .Services Needed at time of discharge: Y = Yes, Blank = No PT:   OT:   RN:   Equipment:   Other:     LOS: 1 day   Valentino Nose, MD IMTS PGY-1 [phone] 02/22/2015, 3:58  PM

## 2015-02-22 NOTE — Progress Notes (Signed)
  Echocardiogram 2D Echocardiogram has been performed.  Darin Brady 02/22/2015, 12:09 PM

## 2015-02-22 NOTE — Progress Notes (Signed)
Pt potassium 3.2 this AM. IMTS notified, new orders given

## 2015-02-22 NOTE — Clinical Documentation Improvement (Signed)
Internal Medicine  Can the diagnosis of CHF be further specified?    Acuity - Acute, Chronic, Acute on Chronic   Type - Systolic, Diastolic, Systolic and Diastolic  Other  Clinically Undetermined   Document any associated diagnoses/conditions   Supporting Information: Acute CHF noted per 12/13 progress notes. 12/12: BNP: 1803.7.   Please exercise your independent, professional judgment when responding. A specific answer is not anticipated or expected.   Thank Sabino Donovan Health Information Management [place] [phone]

## 2015-02-23 ENCOUNTER — Inpatient Hospital Stay (HOSPITAL_COMMUNITY): Payer: MEDICAID

## 2015-02-23 ENCOUNTER — Inpatient Hospital Stay (HOSPITAL_COMMUNITY): Payer: Self-pay

## 2015-02-23 DIAGNOSIS — I509 Heart failure, unspecified: Secondary | ICD-10-CM

## 2015-02-23 LAB — NM MYOCAR MULTI W/SPECT W/WALL MOTION / EF
CHL CUP RESTING HR STRESS: 85 {beats}/min
CSEPED: 0 min
CSEPEDS: 0 s
CSEPEW: 1 METS
MPHR: 159 {beats}/min
Peak HR: 137 {beats}/min
Percent HR: 86 %

## 2015-02-23 LAB — BASIC METABOLIC PANEL
ANION GAP: 7 (ref 5–15)
BUN: 15 mg/dL (ref 6–20)
CALCIUM: 8.9 mg/dL (ref 8.9–10.3)
CHLORIDE: 101 mmol/L (ref 101–111)
CO2: 31 mmol/L (ref 22–32)
Creatinine, Ser: 1.01 mg/dL (ref 0.61–1.24)
GFR calc non Af Amer: >60 mL/min (ref >60)
GLUCOSE: 90 mg/dL (ref 65–99)
POTASSIUM: 4.5 mmol/L (ref 3.5–5.1)
Sodium: 139 mmol/L (ref 135–145)

## 2015-02-23 LAB — CBC
HEMATOCRIT: 45.1 % (ref 39.0–52.0)
HEMOGLOBIN: 15 g/dL (ref 13.0–17.0)
MCH: 32.6 pg (ref 26.0–34.0)
MCHC: 33.3 g/dL (ref 30.0–36.0)
MCV: 98 fL (ref 78.0–100.0)
Platelets: 223 10*3/uL (ref 150–400)
RBC: 4.6 MIL/uL (ref 4.22–5.81)
RDW: 15 % (ref 11.5–15.5)
WBC: 3.7 10*3/uL — ABNORMAL LOW (ref 4.0–10.5)

## 2015-02-23 LAB — PROTIME-INR
INR: 1.15 (ref 0.00–1.49)
Prothrombin Time: 14.9 seconds (ref 11.6–15.2)

## 2015-02-23 MED ORDER — SODIUM CHLORIDE 0.9 % IJ SOLN: 3.0000 mL | INTRAMUSCULAR | Status: DC | PRN

## 2015-02-23 MED ORDER — TECHNETIUM TC 99M SESTAMIBI GENERIC - CARDIOLITE
10.0000 | Freq: Once | INTRAVENOUS | Status: AC | PRN
  Administered 2015-02-23: 10 via INTRAVENOUS

## 2015-02-23 MED ORDER — SODIUM CHLORIDE 0.9 % IV SOLN: 250.0000 mL | INTRAVENOUS | Status: DC | PRN

## 2015-02-23 MED ORDER — TECHNETIUM TC 99M SESTAMIBI GENERIC - CARDIOLITE
30.0000 | Freq: Once | INTRAVENOUS | Status: AC | PRN
  Administered 2015-02-23: 30 via INTRAVENOUS

## 2015-02-23 MED ORDER — REGADENOSON 0.4 MG/5ML IV SOLN
INTRAVENOUS | Status: AC
  Filled 2015-02-23: qty 5

## 2015-02-23 MED ORDER — SODIUM CHLORIDE 0.9 % WEIGHT BASED INFUSION
1.0000 mL/kg/h | INTRAVENOUS | Status: DC
  Administered 2015-02-24: 1 mL/kg/h via INTRAVENOUS

## 2015-02-23 MED ORDER — REGADENOSON 0.4 MG/5ML IV SOLN
0.4000 mg | Freq: Once | INTRAVENOUS | Status: DC
  Filled 2015-02-23: qty 5

## 2015-02-23 MED ORDER — ASPIRIN 81 MG PO CHEW
81.0000 mg | CHEWABLE_TABLET | ORAL | Status: AC
  Administered 2015-02-24: 81 mg via ORAL
  Filled 2015-02-23: qty 1

## 2015-02-23 MED ORDER — SODIUM CHLORIDE 0.9 % WEIGHT BASED INFUSION
3.0000 mL/kg/h | INTRAVENOUS | Status: DC
  Administered 2015-02-24: 3 mL/kg/h via INTRAVENOUS

## 2015-02-23 MED ORDER — SODIUM CHLORIDE 0.9 % IJ SOLN
3.0000 mL | Freq: Two times a day (BID) | INTRAMUSCULAR | Status: DC
  Administered 2015-02-23 (×2): 3 mL via INTRAVENOUS

## 2015-02-23 NOTE — Progress Notes (Signed)
Heart Failure Navigator Consult Note  Presentation: Darin Brady is a 59 y.o. male with a history of HTN, alcohol abuse, tobacco abuse, and previous syncope who presented to [facility] on 02/21/15 with progressive SOB and found to have new onset acute systolic CHF.   He has been seen by Dr. Allyson Sabal in the office on 07/08/13 after an syncopal episode while intoxicated. Dr. Allyson Sabal recommended a cardiac monitor and 2D ECHO, but I don't see that this was ever done.   He presented to [facility] ED on 02/21/15 with progressive dyspnea x 4 days as well as dyspnea on exertion. No chest pain, palpitations or LE edema, but he did complain of orthopnea and PND. 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 However, over the past 4 days he has not been able to work well due to dysnpnea. He tried to tough it out but "couldn't take it anymore" and presented to the ED.  He had previously been on antihypertensives but he has not taken any of these for over 2 years. He occasionally takes his BP at home with his brother's BP cuff and it usually runs high, mostly on the bottom number. He drinks 2 beers on work days and ~4 beers Thurs-Sun. He smokes ~ 1/2 PPD for the past 30 years.   On admission, BNP was 1803.7 and CXR showed cardiomegaly, pulmonary edema and bilateral pleural effusions. Troponin 0.03>0.04>0.04. He was started on IV lasix with good diuresis. 2D ECHO was ordered which revealed LV EF: 20-25% LV severely dilated, akinesis of the entire inferior/inferoseptal myocardium, basal-midanteroseptal myocardium, apicalanterior myocardium. Features are consistent with a pseudonormal left ventricular filling pattern, G2DD andhigh ventricular filling pressure. Aortic valve sclerosis, mild MR, severe LA dilation. A PFO could not be excluded and agitated saline contrast study to rule out PFO recommended.   He was started on IV lasix 40mg  BID with brisk diuresis. He has clinically  improved.  Past Medical History  Diagnosis Date  . Hypertension     Previous anti-hypertensive meds but ran out  . Alcohol abuse     Social History   Social History  . Marital Status: Single    Spouse Name: N/A  . Number of Children: N/A  . Years of Education: N/A   Occupational History  . cafeteria cook    Social History Main Topics  . Smoking status: Current Every Day Smoker -- 0.50 packs/day  . Smokeless tobacco: None  . Alcohol Use: Yes     Comment: occasionally  . Drug Use: No  . Sexual Activity: Yes   Other Topics Concern  . None   Social History Narrative    ECHO:Study Conclusions--02/22/15  - Left ventricle: The cavity size was severely dilated. Systolic function was severely reduced. The estimated ejection fraction was in the range of 20% to 25%. There is akinesis of the entireinferior and inferoseptal myocardium. There is akinesis of the basal-midanteroseptal myocardium. There is akinesis of the apicalanterior myocardium. Features are consistent with a pseudonormal left ventricular filling pattern, with concomitant abnormal relaxation and increased filling pressure (grade 2 diastolic dysfunction). Doppler parameters are consistent with high ventricular filling pressure. - Aortic valve: Moderate thickening and calcification, consistent with sclerosis. - Mitral valve: Calcified annulus. There was mild regurgitation. - Left atrium: The atrium was severely dilated. - Atrial septum: A patent foramen ovale cannot be excluded. - Pericardium, extracardiac: A trivial pericardial effusion was identified posterior to the heart. - Recommendations: Agitated  saline contrast study to rule out PFO.  Recommendations: Agitated saline contrast study to rule out PFO.     Transthoracic echocardiography. M-mode, complete 2D, spectral Doppler, and color Doppler. Birthdate: Patient birthdate: 10-Feb-1956. Age: Patient is 59 yr old. Sex:  Gender: male.  BMI: 17.7 kg/m^2. Blood pressure:   137/99 Patient status: Inpatient. Study date: Study date: 02/22/2015. Study time: 10:18 AM. Location: Echo laboratory.  BNP    Component Value Date/Time   BNP 1803.7* 02/21/2015 1051    ProBNP No results found for: PROBNP   Education Assessment and Provision:  Detailed education and instructions provided on heart failure disease management including the following:  Signs and symptoms of Heart Failure When to call the physician Importance of daily weights Low sodium diet Fluid restriction Medication management Anticipated future follow-up appointments  Patient education given on each of the above topics.  Patient acknowledges understanding and acceptance of all instructions.  I spoke with patient, mother and brother regarding his HF.  He tells me that all of this is new to him.  I briefly reviewed his echocardiogram and HF diagnosis.  He currently does not weigh and we discussed the importance of daily weights and how weight gains relate to the signs and symptoms of HF.  I reviewed a low sodium diet and high sodium foods to avoid.  His mother asked many questions regarding low sodium cooking and reading labels.  I emphasized the importance of taking all medications as prescribed.  I encouraged any of them to call after discharge with any questions or concerns about his HF.  He will follow-up with [facility].      Education Materials:  "Living Better With Heart Failure" Booklet, Daily Weight Tracker Tool    High Risk Criteria for Readmission and/or Poor Patient Outcomes:   EF <30%- Yes 20-25% newly diagnosed  2 or more admissions in 6 months- No  Difficult social situation-Yes--admits to ETOH frequent use  Demonstrates medication noncompliance- denies -however noted to have Lisinopril on med list and patient not taking any medications on admission    Barriers of Care:  New HF--Knowledge and compliance,  ETOH  Discharge Planning:   Plans to return to home with mother in [place].

## 2015-02-23 NOTE — Progress Notes (Addendum)
Subjective: No complaints this morning. Denies any further SOB. No chest pain.   Objective: Vital signs in last 24 hours: Filed Vitals:   02/22/15 1741 02/22/15 2053 02/23/15 0030 02/23/15 0612  BP: 133/80 113/87 128/95 124/98  Pulse: 86 90 86 87  Temp: 98.1 F (36.7 C) 98.6 F (37 C) 98.3 F (36.8 C) 98.2 F (36.8 C)  TempSrc: Oral Oral Oral Oral  Resp: 16 16 16 16   Height:      Weight:    120 lb 4.8 oz (54.568 kg)  SpO2: 100% 100% 100%    Weight change: -24 lb 11.2 oz (-11.204 kg)  Intake/Output Summary (Last 24 hours) at 02/23/15 0701 Last data filed at 02/23/15 7209  Gross per 24 hour  Intake    942 ml  Output   3425 ml  Net  -2483 ml    PHYSICAL EXAM GENERAL- alert, co-operative, appears as stated age, not in any distress. HEENT- Atraumatic, normocephalic, PERRL, EOMI, oral mucosa appears moist CARDIAC- RRR, no murmurs, rubs or gallops. No JVD. RESP- Moving equal volumes of air, and clear to auscultation bilaterally, no wheezes or crackles. ABDOMEN- Soft, nontender, bowel sounds present. NEURO- No obvious Cr N abnormality. EXTREMITIES- pulse 2+, symmetric, trace pedal edema. SKIN- Warm, dry, No rash or lesion. PSYCH- Normal mood and affect, appropriate thought content and speech.  Medications: I have reviewed the patient's current medications. Scheduled Meds: . aspirin EC  81 mg Oral Daily  . carvedilol  3.125 mg Oral BID WC  . enoxaparin (LOVENOX) injection  40 mg Subcutaneous Q24H  . folic acid  1 mg Oral Daily  . furosemide  40 mg Intravenous Daily  . lisinopril  5 mg Oral Daily  . multivitamin with minerals  1 tablet Oral Daily  . regadenoson  0.4 mg Intravenous Once  . sodium chloride  3 mL Intravenous Q12H  . sodium chloride  3 mL Intravenous Q12H  . thiamine  100 mg Oral Daily   Continuous Infusions:  PRN Meds:.sodium chloride, LORazepam **OR** [DISCONTINUED] LORazepam, senna-docusate, sodium chloride Assessment/Plan:  Acute systolic CHF:  Patient with net 2.5 L out overnight. Weight stable at120 lbs. Denies any further dyspnea. Leg edema improving. ECHO 12/13with LV EFF 20-25%, LV severely dilated, akinesis of the entire inferior/inferoseptal myocardium, basal-midanteroseptal myocardium, apicalanterior myocardium. Troponin 0.03->0.04->0.04, likely demand ischemia from acute exacerbation. Likely 2/2 uncontrolled HTN and ETOH abuse. Will go for [facility] today for ischemic evaluation. BMP pending, will follow up Cr.  -Appreciate cards recs -Continue lasix to 40 mg IV daily -A1c 5.7 -Lisinopril 5 mg daily -ASA 81 mg daily -Coreg 3.125 mg PO bid -Myoview to assess for CAD today -Strict I&O and daily weights  Epigastric pain: No complaints of further abdominal pain.  -RUQ US unremarkable -Senokot-S prn  HTN: Patient with history of HTN on no medications for the past 2 years after moving to [facility] and not establishing with any PCP. Previously on Lisinopril and HCTZ. BP better controlled after starting anti-hypertensives.  -Continue Lisinopril 5 mg daily  -Coreg 3.125 mg bid  Leukopenia: F/U CBC still pending.  -HIV negative -CBC in am  ETOH abuse: Admits 3-4 beers daily. Denies any history of withdrawal or seizures. -CIWA protocol   FEN: NPO until after procedure  DVT PPx: Lovenox  Dispo: Disposition is deferred at this time, awaiting improvement of current medical problems.  Anticipated discharge in approximately 1-2 day(s).   The patient does not have a current PCP (No Pcp Per Patient) and does  need an [facility] hospital follow-up appointment after discharge.  The patient does not have transportation limitations that hinder transportation to clinic appointments.  .Services Needed at time of discharge: Y = Yes, Blank = No PT:   OT:   RN:   Equipment:   Other:     LOS: 2 days   Valentino Nose, MD IMTS PGY-1 [phone] 02/23/2015, 7:01 AM

## 2015-02-23 NOTE — Progress Notes (Signed)
Patient Name: Darin Brady Date of Encounter: 02/23/2015  Active Problems:   Acute CHF (congestive heart failure) (HCC)   Epigastric pain   Acute congestive heart failure [facility])     Primary Cardiologist: Dr. Allyson Sabal Patient Profile: 59 y.o. male w/ PMH of HTN, alcohol abuse, tobacco abuse, and previous syncope who presented to [facility] on 02/21/15 with progressive SOB and found to have new onset acute systolic CHF.   SUBJECTIVE: Denies any chest pain or shortness of breath overnight. Seen in nuc med for 1-day NST.  OBJECTIVE Filed Vitals:   02/23/15 0920 02/23/15 0922 02/23/15 0924 02/23/15 0925  BP: 114/98 122/96 123/94   Pulse: 89 94 97 96  Temp:      TempSrc:      Resp:      Height:      Weight:      SpO2:        Intake/Output Summary (Last 24 hours) at 02/23/15 0926 Last data filed at 02/23/15 0900  Gross per 24 hour  Intake    942 ml  Output   2425 ml  Net  -1483 ml   Filed Weights   02/21/15 1500 02/22/15 0535 02/23/15 0612  Weight: 131 lb 12.8 oz (59.784 kg) 120 lb 4.8 oz (54.568 kg) 120 lb 4.8 oz (54.568 kg)    PHYSICAL EXAM General: Thin-appearing African American male in no acute distress. Head: Normocephalic, atraumatic.  Neck: Supple without bruits, JVD not elevated. Lungs:  Resp regular and unlabored, CTA without wheezing or rales. Heart: RRR, S1, S2, no S3, S4, or murmur; no rub. Abdomen: Soft, non-tender, non-distended with normoactive bowel sounds. No hepatomegaly. No rebound/guarding. No obvious abdominal masses. Extremities: No clubbing, cyanosis, or edema. Distal pedal pulses are 2+ bilaterally. Neuro: Alert and oriented X 3. Moves all extremities spontaneously. Psych: Normal affect.   LABS: CBC: Recent Labs  02/21/15 1051 02/22/15 0340  WBC 2.7* 3.9*  NEUTROABS 1.4*  --   HGB 12.4* 12.6*  HCT 36.9* 37.5*  MCV 97.6 95.9  PLT 176 202   INR:No results for input(s): INR in the last 72 hours. Basic Metabolic Panel: Recent Labs  02/21/15 1051 02/22/15 0340  NA 140 141  K 3.8 3.2*  CL 109 103  CO2 23 29  GLUCOSE 99 97  BUN 18 17  CREATININE 0.90 0.95  CALCIUM 8.7* 8.4*  MG  --  1.7   Liver Function Tests: Recent Labs  02/21/15 1051  AST 45*  ALT 34  ALKPHOS 104  BILITOT 2.0*  PROT 6.7  ALBUMIN 3.4*   Cardiac Enzymes: Recent Labs  02/21/15 1051 02/21/15 1708 02/21/15 2320  TROPONINI 0.03 0.04* 0.04*   No results for input(s): TROPIPOC in the last 72 hours. BNP:  B NATRIURETIC PEPTIDE  Date/Time Value Ref Range Status  02/21/2015 10:51 AM 1803.7* 0.0 - 100.0 pg/mL Final   No results found for: PROBNP D-dimer:No results for input(s): DDIMER in the last 72 hours. Hemoglobin A1C: Recent Labs  02/21/15 1708  HGBA1C 5.7*   Fasting Lipid Panel: Recent Labs  02/22/15 0340  CHOL 92  HDL 27*  LDLCALC 48  TRIG 87  CHOLHDL 3.4   Thyroid Function Tests: Recent Labs  02/21/15 1708  TSH 0.929    TELE:  Seen in nuclear medicine. Not reviewed.    Radiology/Studies: Dg Chest 2 View: 02/21/2015  CLINICAL DATA:  Shortness of breath for 4 days. Abdominal pain and bloating. EXAM: CHEST  2 VIEW COMPARISON:  06/08/2014 and  06/28/2013. FINDINGS: There is progressive enlargement of the cardiac silhouette. There are increased interstitial markings with new small bilateral pleural effusions and fissural thickening bilaterally. There is no confluent airspace opacity or pneumothorax. The bones appear unchanged. IMPRESSION: Increased cardiomegaly, pulmonary edema and bilateral pleural effusions suspicious for congestive heart failure. Electronically Signed   By: William  Veazey M.D.   On: 02/21/2015 10:15   Us Abdomen Limited Ruq: 02/21/2015  CLINICAL DATA:  Epigastric pain for 1.5 weeks. EXAM: US ABDOMEN LIMITED - RIGHT UPPER QUADRANT COMPARISON:  None. FINDINGS: Gallbladder: No gallstones or wall thickening visualized. No sonographic Murphy sign noted. Common bile duct: Diameter: 4.2 mm, normal Liver: No  focal lesion identified. Within normal limits in parenchymal echogenicity. IMPRESSION: Normal examination.  No evidence of cholelithiasis or cholecystitis. Electronically Signed   By: William  Stevens M.D.   On: 02/21/2015 20:10     Current Medications:  . aspirin EC  81 mg Oral Daily  . carvedilol  3.125 mg Oral BID WC  . enoxaparin (LOVENOX) injection  40 mg Subcutaneous Q24H  . folic acid  1 mg Oral Daily  . furosemide  40 mg Intravenous Daily  . lisinopril  5 mg Oral Daily  . multivitamin with minerals  1 tablet Oral Daily  . regadenoson      . regadenoson  0.4 mg Intravenous Once  . regadenoson  0.4 mg Intravenous Once  . sodium chloride  3 mL Intravenous Q12H  . sodium chloride  3 mL Intravenous Q12H  . thiamine  100 mg Oral Daily      ASSESSMENT AND PLAN:  1. Acute systolic CHF - on admission, BNP was 1803.7 and CXR showed cardiomegaly, pulmonary edema and bilateral pleural effusions.  - 2D ECHO was ordered which revealed LV EF: 20-25% LV severely dilated, akinesis of the entire inferior/inferoseptal myocardium, basal-midanteroseptal myocardium, apicalanterior myocardium. Features are consistent with a pseudonormal left ventricular filling pattern, G2DD andhigh ventricular filling pressure.  - Troponin 0.03>0.04>0.04 likely demand ischemia in the setting of acute CHF. Likely from uncontrolled HTN and alcohol abuse. - Started on lisinopril 5mg qd and Carvedilol 3.125mg BID. - Started on IV lasix 40mg BID with brisk diuresis. Net neg 6.1 L. Weight down 25 lbs? Doubt this much but weight appears to be trending downwards.  - 1-day NST performed with official read pending by [facility].  2. Uncontrolled HTN - he had previously been on antihypertensives but he has not taken any of these for over 2 years. - BPs better controlled on lisinopril 5mg and IV lasix.   3. Alcohol/tobacco abuse-  he drinks 2 beers on work days and ~4 beers Thurs-Sun and has a 30 pack year hx  of smoking.  -Counseled on cessation   4. Possible PFO - a PFO could not be excluded on 2D ECHO and an agitated saline contrast study to rule out PFO was recommended.    Signed, Brittany M Strader , PA-C 9:26 AM 02/23/2015 Pager: [phone]   Attending Note:   The patient was seen and examined.  Agree with assessment and plan as noted above.  Changes made to the above note as needed.  myoview shows inferior defect , non reversible. Could be an infarct but also could be due to uptake in bowel below the diaphragm  "stealing counts" from the inferior wall.  I do think he needs a cath  Have discussed risks, benefits , options.  He understands and agrees to proceed.  Have scheduled it for 9 am -   right , left and possible PCI    Alvia Grove., MD, [facility] 02/23/2015, 12:57 PM [address]  [address] Office [phone] Pager [phone]

## 2015-02-24 ENCOUNTER — Telehealth: Payer: Self-pay | Admitting: Cardiovascular Disease

## 2015-02-24 ENCOUNTER — Encounter (HOSPITAL_COMMUNITY)
Admission: EM | Disposition: A | Discharge status: Home / Self Care | Payer: Self-pay | Source: Home / Self Care | Attending: Student in an Organized Health Care Education/Training Program

## 2015-02-24 ENCOUNTER — Encounter (HOSPITAL_COMMUNITY): Payer: Self-pay | Admitting: Cardiology

## 2015-02-24 HISTORY — PX: CARDIAC CATHETERIZATION: SHX172

## 2015-02-24 LAB — BASIC METABOLIC PANEL
ANION GAP: 9 (ref 5–15)
BUN: 19 mg/dL (ref 6–20)
CO2: 30 mmol/L (ref 22–32)
Calcium: 8.6 mg/dL — ABNORMAL LOW (ref 8.9–10.3)
Chloride: 100 mmol/L — ABNORMAL LOW (ref 101–111)
Creatinine, Ser: 1.04 mg/dL (ref 0.61–1.24)
GFR calc Af Amer: >60 mL/min (ref >60)
GLUCOSE: 106 mg/dL — AB (ref 65–99)
POTASSIUM: 4.9 mmol/L (ref 3.5–5.1)
SODIUM: 139 mmol/L (ref 135–145)

## 2015-02-24 LAB — CREATININE, SERUM
CREATININE: 1.02 mg/dL (ref 0.61–1.24)
GFR calc Af Amer: >60 mL/min (ref >60)
GFR calc non Af Amer: >60 mL/min (ref >60)

## 2015-02-24 LAB — CBC
HEMATOCRIT: 44.2 % (ref 39.0–52.0)
HEMOGLOBIN: 14.6 g/dL (ref 13.0–17.0)
MCH: 32.7 pg (ref 26.0–34.0)
MCHC: 33 g/dL (ref 30.0–36.0)
MCV: 98.9 fL (ref 78.0–100.0)
Platelets: 238 10*3/uL (ref 150–400)
RBC: 4.47 MIL/uL (ref 4.22–5.81)
RDW: 14.9 % (ref 11.5–15.5)
WBC: 3.4 10*3/uL — ABNORMAL LOW (ref 4.0–10.5)

## 2015-02-24 LAB — POCT ACTIVATED CLOTTING TIME: Activated Clotting Time: 178 seconds

## 2015-02-24 SURGERY — RIGHT/LEFT HEART CATH AND CORONARY ANGIOGRAPHY

## 2015-02-24 MED ORDER — HEPARIN SODIUM (PORCINE) 1000 UNIT/ML IJ SOLN
INTRAMUSCULAR | Status: DC | PRN
  Administered 2015-02-24: 3000 [IU] via INTRAVENOUS

## 2015-02-24 MED ORDER — FENTANYL CITRATE (PF) 100 MCG/2ML IJ SOLN
INTRAMUSCULAR | Status: AC
  Filled 2015-02-24: qty 2

## 2015-02-24 MED ORDER — HEPARIN (PORCINE) IN NACL 2-0.9 UNIT/ML-% IJ SOLN
INTRAMUSCULAR | Status: AC
  Filled 2015-02-24: qty 1500

## 2015-02-24 MED ORDER — SODIUM CHLORIDE 0.9 % IV SOLN: 250.0000 mL | INTRAVENOUS | Status: DC | PRN

## 2015-02-24 MED ORDER — HEPARIN SODIUM (PORCINE) 1000 UNIT/ML IJ SOLN
INTRAMUSCULAR | Status: AC
  Filled 2015-02-24: qty 1

## 2015-02-24 MED ORDER — FENTANYL CITRATE (PF) 100 MCG/2ML IJ SOLN
INTRAMUSCULAR | Status: DC | PRN
  Administered 2015-02-24: 25 ug via INTRAVENOUS

## 2015-02-24 MED ORDER — ENOXAPARIN SODIUM 40 MG/0.4ML ~~LOC~~ SOLN
40.0000 mg | SUBCUTANEOUS | Status: DC
Start: 2015-02-25 — End: 2015-02-24

## 2015-02-24 MED ORDER — VERAPAMIL HCL 2.5 MG/ML IV SOLN
INTRAVENOUS | Status: DC | PRN
  Administered 2015-02-24: 12:00:00 via INTRA_ARTERIAL

## 2015-02-24 MED ORDER — MIDAZOLAM HCL 2 MG/2ML IJ SOLN
INTRAMUSCULAR | Status: AC
  Filled 2015-02-24: qty 2

## 2015-02-24 MED ORDER — SODIUM CHLORIDE 0.9 % IJ SOLN: 3.0000 mL | Freq: Two times a day (BID) | INTRAMUSCULAR | Status: DC

## 2015-02-24 MED ORDER — LIDOCAINE HCL (PF) 1 % IJ SOLN
INTRAMUSCULAR | Status: DC | PRN
  Administered 2015-02-24: 12:00:00

## 2015-02-24 MED ORDER — LISINOPRIL 5 MG PO TABS: 5.0000 mg | ORAL_TABLET | Freq: Every day | ORAL | Status: DC

## 2015-02-24 MED ORDER — LISINOPRIL 2.5 MG PO TABS: 2.5000 mg | ORAL_TABLET | Freq: Every day | ORAL | Status: DC

## 2015-02-24 MED ORDER — MIDAZOLAM HCL 2 MG/2ML IJ SOLN
INTRAMUSCULAR | Status: DC | PRN
  Administered 2015-02-24: 2 mg via INTRAVENOUS

## 2015-02-24 MED ORDER — VERAPAMIL HCL 2.5 MG/ML IV SOLN
INTRAVENOUS | Status: AC
  Filled 2015-02-24: qty 2

## 2015-02-24 MED ORDER — LISINOPRIL 2.5 MG PO TABS: 5.0000 mg | ORAL_TABLET | Freq: Every day | ORAL | Status: DC

## 2015-02-24 MED ORDER — SODIUM CHLORIDE 0.9 % IJ SOLN: 3.0000 mL | INTRAMUSCULAR | Status: DC | PRN

## 2015-02-24 MED ORDER — IOHEXOL 350 MG/ML SOLN
INTRAVENOUS | Status: DC | PRN
  Administered 2015-02-24: 80 mL via INTRA_ARTERIAL

## 2015-02-24 MED ORDER — FUROSEMIDE 20 MG PO TABS: 20.0000 mg | ORAL_TABLET | Freq: Every day | ORAL | Status: DC

## 2015-02-24 MED ORDER — LIDOCAINE HCL (PF) 1 % IJ SOLN
INTRAMUSCULAR | Status: AC
  Filled 2015-02-24: qty 30

## 2015-02-24 MED ORDER — CARVEDILOL 3.125 MG PO TABS: 3.1250 mg | ORAL_TABLET | Freq: Two times a day (BID) | ORAL | Status: DC

## 2015-02-24 SURGICAL SUPPLY — 16 items
CATH BALLN WEDGE 5F 110CM (CATHETERS) ×2 IMPLANT
CATH INFINITI 5 FR JL3.5 (CATHETERS) ×3 IMPLANT
CATH INFINITI 5FR ANG PIGTAIL (CATHETERS) ×3 IMPLANT
CATH INFINITI JR4 5F (CATHETERS) ×3 IMPLANT
CATH SWAN GANZ 7F STRAIGHT (CATHETERS) ×2 IMPLANT
DEVICE RAD COMP TR BAND LRG (VASCULAR PRODUCTS) ×3 IMPLANT
GLIDESHEATH SLEND SS 6F .021 (SHEATH) ×5 IMPLANT
KIT HEART LEFT (KITS) ×3 IMPLANT
KIT HEART RIGHT NAMIC (KITS) ×3 IMPLANT
PACK CARDIAC CATHETERIZATION (CUSTOM PROCEDURE TRAY) ×3 IMPLANT
SHEATH FAST CATH BRACH 5F 5CM (SHEATH) ×2 IMPLANT
SHEATH PINNACLE 7F 10CM (SHEATH) ×2 IMPLANT
SYR MEDRAD MARK V 150ML (SYRINGE) ×3 IMPLANT
TRANSDUCER W/STOPCOCK (MISCELLANEOUS) ×6 IMPLANT
TUBING CIL FLEX 10 FLL-RA (TUBING) ×3 IMPLANT
WIRE SAFE-T 1.5MM-J .035X260CM (WIRE) ×3 IMPLANT

## 2015-02-24 NOTE — Research (Signed)
CADLAD Informed Consent   Subject Name: Darin Brady  Subject met inclusion and exclusion criteria.  The informed consent form, study requirements and expectations were reviewed with the subject and questions and concerns were addressed prior to the signing of the consent form.  The subject verbalized understanding of the trail requirements.  The subject agreed to participate in the CADLAD trial and signed the informed consent.  The informed consent was obtained prior to performance of any protocol-specific procedures for the subject.  A copy of the signed informed consent was given to the subject and a copy was placed in the subject's medical record.  [name] 02/24/2015, 7:30

## 2015-02-24 NOTE — Telephone Encounter (Signed)
New message     TCM appt on  12.23.2016 @ 8 am with Boyce Medici per Ronie Spies

## 2015-02-24 NOTE — Discharge Instructions (Addendum)
Cath site information: No driving for 2 days. No lifting over 5 lbs for 1 week. No sexual activity for 1 week. Keep procedure site clean & dry. If you notice increased pain, swelling, bleeding or pus, call/return!  You may shower, but no soaking baths/hot tubs/pools for 1 week.   We have given you prescriptions for 3 new medications. Lisinopril 2.5 mg daily, Lasix 20 mg daily and Coreg 3.125 mg bid. If you have any questions about these medications please call our clinic at [phone]. Please check your weights daily and call the clinic if your weight starts to increase or you start having shortness of breath again.   You have follow up appointments at our clinic downstairs next Thursday 12/22 at 8:15 and with the cardiologist office on Friday 12/23 at 8 am.   Heart Failure Heart failure is a condition in which the heart has trouble pumping blood. This means your heart does not pump blood efficiently for your body to work well. In some cases of heart failure, fluid may back up into your lungs or you may have swelling (edema) in your lower legs. Heart failure is usually a long-term (chronic) condition. It is important for you to take good care of yourself and follow your health care provider's treatment plan. CAUSES  Some health conditions can cause heart failure. Those health conditions include:  High blood pressure (hypertension). Hypertension causes the heart muscle to work harder than normal. When pressure in the blood vessels is high, the heart needs to pump (contract) with more force in order to circulate blood throughout the body. High blood pressure eventually causes the heart to become stiff and weak.  Coronary artery disease (CAD). CAD is the buildup of cholesterol and fat (plaque) in the arteries of the heart. The blockage in the arteries deprives the heart muscle of oxygen and blood. This can cause chest pain and may lead to a heart attack. High blood pressure can also contribute to  CAD.  Heart attack (myocardial infarction). A heart attack occurs when one or more arteries in the heart become blocked. The loss of oxygen damages the muscle tissue of the heart. When this happens, part of the heart muscle dies. The injured tissue does not contract as well and weakens the heart's ability to pump blood.  Abnormal heart valves. When the heart valves do not open and close properly, it can cause heart failure. This makes the heart muscle pump harder to keep the blood flowing.  Heart muscle disease (cardiomyopathy or myocarditis). Heart muscle disease is damage to the heart muscle from a variety of causes. These can include drug or alcohol abuse, infections, or unknown reasons. These can increase the risk of heart failure.  Lung disease. Lung disease makes the heart work harder because the lungs do not work properly. This can cause a strain on the heart, leading it to fail.  Diabetes. Diabetes increases the risk of heart failure. High blood sugar contributes to high fat (lipid) levels in the blood. Diabetes can also cause slow damage to tiny blood vessels that carry important nutrients to the heart muscle. When the heart does not get enough oxygen and food, it can cause the heart to become weak and stiff. This leads to a heart that does not contract efficiently.  Other conditions can contribute to heart failure. These include abnormal heart rhythms, thyroid problems, and low blood counts (anemia). Certain unhealthy behaviors can increase the risk of heart failure, including:  Being overweight.  Smoking or  chewing tobacco.  Eating foods high in fat and cholesterol.  Abusing illicit drugs or alcohol.  Lacking physical activity. SYMPTOMS  Heart failure symptoms may vary and can be hard to detect. Symptoms may include:  Shortness of breath with activity, such as climbing stairs.  Persistent cough.  Swelling of the feet, ankles, legs, or abdomen.  Unexplained weight  gain.  Difficulty breathing when lying flat (orthopnea).  Waking from sleep because of the need to sit up and get more air.  Rapid heartbeat.  Fatigue and loss of energy.  Feeling light-headed, dizzy, or close to fainting.  Loss of appetite.  Nausea.  Increased urination during the night (nocturia). DIAGNOSIS  A diagnosis of heart failure is based on your history, symptoms, physical examination, and diagnostic tests. Diagnostic tests for heart failure may include:  Echocardiography.  Electrocardiography.  Chest X-ray.  Blood tests.  Exercise stress test.  Cardiac angiography.  Radionuclide scans. TREATMENT  Treatment is aimed at managing the symptoms of heart failure. Medicines, behavioral changes, or surgical intervention may be necessary to treat heart failure.  Medicines to help treat heart failure may include:  Angiotensin-converting enzyme (ACE) inhibitors. This type of medicine blocks the effects of a blood protein called angiotensin-converting enzyme. ACE inhibitors relax (dilate) the blood vessels and help lower blood pressure.  Angiotensin receptor blockers (ARBs). This type of medicine blocks the actions of a blood protein called angiotensin. Angiotensin receptor blockers dilate the blood vessels and help lower blood pressure.  Water pills (diuretics). Diuretics cause the kidneys to remove salt and water from the blood. The extra fluid is removed through urination. This loss of extra fluid lowers the volume of blood the heart pumps.  Beta blockers. These prevent the heart from beating too fast and improve heart muscle strength.  Digitalis. This increases the force of the heartbeat.  Healthy behavior changes include:  Obtaining and maintaining a healthy weight.  Stopping smoking or chewing tobacco.  Eating heart-healthy foods.  Limiting or avoiding alcohol.  Stopping illicit drug use.  Physical activity as directed by your health care  provider.  Surgical treatment for heart failure may include:  A procedure to open blocked arteries, repair damaged heart valves, or remove damaged heart muscle tissue.  A pacemaker to improve heart muscle function and control certain abnormal heart rhythms.  An internal cardioverter defibrillator to treat certain serious abnormal heart rhythms.  A left ventricular assist device (LVAD) to assist the pumping ability of the heart. HOME CARE INSTRUCTIONS   Take medicines only as directed by your health care provider. Medicines are important in reducing the workload of your heart, slowing the progression of heart failure, and improving your symptoms.  Do not stop taking your medicine unless directed by your health care provider.  Do not skip any dose of medicine.  Refill your prescriptions before you run out of medicine. Your medicines are needed every day.  Engage in moderate physical activity if directed by your health care provider. Moderate physical activity can benefit some people. The elderly and people with severe heart failure should consult with a health care provider for physical activity recommendations.  Eat heart-healthy foods. Food choices should be free of trans fat and low in saturated fat, cholesterol, and salt (sodium). Healthy choices include fresh or frozen fruits and vegetables, fish, lean meats, legumes, fat-free or low-fat dairy products, and whole grain or high fiber foods. Talk to a dietitian to learn more about heart-healthy foods.  Limit sodium if directed by your  health care provider. Sodium restriction may reduce symptoms of heart failure in some people. Talk to a dietitian to learn more about heart-healthy seasonings.  Use healthy cooking methods. Healthy cooking methods include roasting, grilling, broiling, baking, poaching, steaming, or stir-frying. Talk to a dietitian to learn more about healthy cooking methods.  Limit fluids if directed by your health care  provider. Fluid restriction may reduce symptoms of heart failure in some people.  Weigh yourself every day. Daily weights are important in the early recognition of excess fluid. You should weigh yourself every morning after you urinate and before you eat breakfast. Wear the same amount of clothing each time you weigh yourself. Record your daily weight. Provide your health care provider with your weight record.  Monitor and record your blood pressure if directed by your health care provider.  Check your pulse if directed by your health care provider.  Lose weight if directed by your health care provider. Weight loss may reduce symptoms of heart failure in some people.  Stop smoking or chewing tobacco. Nicotine makes your heart work harder by causing your blood vessels to constrict. Do not use nicotine gum or patches before talking to your health care provider.  Keep all follow-up visits as directed by your health care provider. This is important.  Limit alcohol intake to no more than 1 drink per day for nonpregnant women and 2 drinks per day for men. One drink equals 12 ounces of beer, 5 ounces of wine, or 1 ounces of hard liquor. Drinking more than that is harmful to your heart. Tell your health care provider if you drink alcohol several times a week. Talk with your health care provider about whether alcohol is safe for you. If your heart has already been damaged by alcohol or you have severe heart failure, drinking alcohol should be stopped completely.  Stop illicit drug use.  Stay up-to-date with immunizations. It is especially important to prevent respiratory infections through current pneumococcal and influenza immunizations.  Manage other health conditions such as hypertension, diabetes, thyroid disease, or abnormal heart rhythms as directed by your health care provider.  Learn to manage stress.  Plan rest periods when fatigued.  Learn strategies to manage high temperatures. If the  weather is extremely hot:  Avoid vigorous physical activity.  Use air conditioning or fans or seek a cooler location.  Avoid caffeine and alcohol.  Wear loose-fitting, lightweight, and light-colored clothing.  Learn strategies to manage cold temperatures. If the weather is extremely cold:  Avoid vigorous physical activity.  Layer clothes.  Wear mittens or gloves, a hat, and a scarf when going outside.  Avoid alcohol.  Obtain ongoing education and support as needed.  Participate in or seek rehabilitation as needed to maintain or improve independence and quality of life. SEEK MEDICAL CARE IF:   You have a rapid weight gain.  You have increasing shortness of breath that is unusual for you.  You are unable to participate in your usual physical activities.  You tire easily.  You cough more than normal, especially with physical activity.  You have any or more swelling in areas such as your hands, feet, ankles, or abdomen.  You are unable to sleep because it is hard to breathe.  You feel like your heart is beating fast (palpitations).  You become dizzy or light-headed upon standing up. SEEK IMMEDIATE MEDICAL CARE IF:   You have difficulty breathing.  There is a change in mental status such as decreased alertness or difficulty  with concentration.  You have a pain or discomfort in your chest.  You have an episode of fainting (syncope). MAKE SURE YOU:   Understand these instructions.  Will watch your condition.  Will get help right away if you are not doing well or get worse.   This information is not intended to replace advice given to you by your health care provider. Make sure you discuss any questions you have with your health care provider.   Document Released: 02/26/2005 Document Revised: 07/13/2014 Document Reviewed: 03/28/2012 Elsevier Interactive Patient Education [employer]

## 2015-02-24 NOTE — Interval H&P Note (Signed)
History and Physical Interval Note:  02/24/2015 11:15 AM  Darin Brady  has presented today for surgery, with the diagnosis of hf  The various methods of treatment have been discussed with the patient and family. After consideration of risks, benefits and other options for treatment, the patient has consented to  Procedure(s): Right/Left Heart Cath and Coronary Angiography (N/A) as a surgical intervention .  The patient's history has been reviewed, patient examined, no change in status, stable for surgery.  I have reviewed the patient's chart and labs.  Questions were answered to the patient's satisfaction.   Cath Lab Visit (complete for each Cath Lab visit)  Clinical Evaluation Leading to the Procedure:   ACS: Yes.    Non-ACS:    Anginal Classification: CCS III  Anti-ischemic medical therapy: Minimal Therapy (1 class of medications)  Non-Invasive Test Results: High-risk stress test findings: cardiac mortality >3%/year  Prior CABG: No previous CABG        Theron Arista [facility] 02/24/2015 11:15 AM

## 2015-02-24 NOTE — Telephone Encounter (Signed)
Reviewed, pt discharge date 02/24/15.  Call 02/25/15.
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
## 2015-02-24 NOTE — Progress Notes (Signed)
IM contacted [place] to see if patient OK for D/C. Discussed with Dr. [place] who did cath today as I have not met patient before. Med recommendations for going home include Lasix 63m po daily, lisinopril 2.534mdaily, and carvedilol 3.12551mID. F/u scheduled with BriLyda Jester-C  03/04/15 at 8am. Discussed recs with Dr. BosCharlynn Grimesayna Kinya Meine PA-C

## 2015-02-24 NOTE — Progress Notes (Signed)
Right femoral venous sheath removed and pressure held for 10 minutes. Groin level zero, patient verbalizes understanding of post sheath instructions. No apparent complications. Right pedal pulse palpable.

## 2015-02-24 NOTE — H&P (View-Only) (Signed)
Patient Name: Darin Brady Date of Encounter: 02/23/2015  Active Problems:   Acute CHF (congestive heart failure) (HCC)   Epigastric pain   Acute congestive heart failure [facility])     Primary Cardiologist: Dr. Allyson Sabal Patient Profile: 59 y.o. male w/ PMH of HTN, alcohol abuse, tobacco abuse, and previous syncope who presented to [facility] on 02/21/15 with progressive SOB and found to have new onset acute systolic CHF.   SUBJECTIVE: Denies any chest pain or shortness of breath overnight. Seen in nuc med for 1-day NST.  OBJECTIVE Filed Vitals:   02/23/15 0920 02/23/15 0922 02/23/15 0924 02/23/15 0925  BP: 114/98 122/96 123/94   Pulse: 89 94 97 96  Temp:      TempSrc:      Resp:      Height:      Weight:      SpO2:        Intake/Output Summary (Last 24 hours) at 02/23/15 0926 Last data filed at 02/23/15 0900  Gross per 24 hour  Intake    942 ml  Output   2425 ml  Net  -1483 ml   Filed Weights   02/21/15 1500 02/22/15 0535 02/23/15 0612  Weight: 131 lb 12.8 oz (59.784 kg) 120 lb 4.8 oz (54.568 kg) 120 lb 4.8 oz (54.568 kg)    PHYSICAL EXAM General: Thin-appearing African American male in no acute distress. Head: Normocephalic, atraumatic.  Neck: Supple without bruits, JVD not elevated. Lungs:  Resp regular and unlabored, CTA without wheezing or rales. Heart: RRR, S1, S2, no S3, S4, or murmur; no rub. Abdomen: Soft, non-tender, non-distended with normoactive bowel sounds. No hepatomegaly. No rebound/guarding. No obvious abdominal masses. Extremities: No clubbing, cyanosis, or edema. Distal pedal pulses are 2+ bilaterally. Neuro: Alert and oriented X 3. Moves all extremities spontaneously. Psych: Normal affect.   LABS: CBC: Recent Labs  02/21/15 1051 02/22/15 0340  WBC 2.7* 3.9*  NEUTROABS 1.4*  --   HGB 12.4* 12.6*  HCT 36.9* 37.5*  MCV 97.6 95.9  PLT 176 202   INR:No results for input(s): INR in the last 72 hours. Basic Metabolic Panel: Recent Labs  02/21/15 1051 02/22/15 0340  NA 140 141  K 3.8 3.2*  CL 109 103  CO2 23 29  GLUCOSE 99 97  BUN 18 17  CREATININE 0.90 0.95  CALCIUM 8.7* 8.4*  MG  --  1.7   Liver Function Tests: Recent Labs  02/21/15 1051  AST 45*  ALT 34  ALKPHOS 104  BILITOT 2.0*  PROT 6.7  ALBUMIN 3.4*   Cardiac Enzymes: Recent Labs  02/21/15 1051 02/21/15 1708 02/21/15 2320  TROPONINI 0.03 0.04* 0.04*   No results for input(s): TROPIPOC in the last 72 hours. BNP:  B NATRIURETIC PEPTIDE  Date/Time Value Ref Range Status  02/21/2015 10:51 AM 1803.7* 0.0 - 100.0 pg/mL Final   No results found for: PROBNP D-dimer:No results for input(s): DDIMER in the last 72 hours. Hemoglobin A1C: Recent Labs  02/21/15 1708  HGBA1C 5.7*   Fasting Lipid Panel: Recent Labs  02/22/15 0340  CHOL 92  HDL 27*  LDLCALC 48  TRIG 87  CHOLHDL 3.4   Thyroid Function Tests: Recent Labs  02/21/15 1708  TSH 0.929    TELE:  Seen in nuclear medicine. Not reviewed.    Radiology/Studies: Dg Chest 2 View: 02/21/2015  CLINICAL DATA:  Shortness of breath for 4 days. Abdominal pain and bloating. EXAM: CHEST  2 VIEW COMPARISON:  06/08/2014 and  06/28/2013. FINDINGS: There is progressive enlargement of the cardiac silhouette. There are increased interstitial markings with new small bilateral pleural effusions and fissural thickening bilaterally. There is no confluent airspace opacity or pneumothorax. The bones appear unchanged. IMPRESSION: Increased cardiomegaly, pulmonary edema and bilateral pleural effusions suspicious for congestive heart failure. Electronically Signed   By: Carey Bullocks M.D.   On: 02/21/2015 10:15   US Abdomen Limited Ruq: 02/21/2015  CLINICAL DATA:  Epigastric pain for 1.5 weeks. EXAM: US ABDOMEN LIMITED - RIGHT UPPER QUADRANT COMPARISON:  None. FINDINGS: Gallbladder: No gallstones or wall thickening visualized. No sonographic Murphy sign noted. Common bile duct: Diameter: 4.2 mm, normal Liver: No  focal lesion identified. Within normal limits in parenchymal echogenicity. IMPRESSION: Normal examination.  No evidence of cholelithiasis or cholecystitis. Electronically Signed   By: Burman Nieves M.D.   On: 02/21/2015 20:10     Current Medications:  . aspirin EC  81 mg Oral Daily  . carvedilol  3.125 mg Oral BID WC  . enoxaparin (LOVENOX) injection  40 mg Subcutaneous Q24H  . folic acid  1 mg Oral Daily  . furosemide  40 mg Intravenous Daily  . lisinopril  5 mg Oral Daily  . multivitamin with minerals  1 tablet Oral Daily  . regadenoson      . regadenoson  0.4 mg Intravenous Once  . regadenoson  0.4 mg Intravenous Once  . sodium chloride  3 mL Intravenous Q12H  . sodium chloride  3 mL Intravenous Q12H  . thiamine  100 mg Oral Daily      ASSESSMENT AND PLAN:  1. Acute systolic CHF - on admission, BNP was 1803.7 and CXR showed cardiomegaly, pulmonary edema and bilateral pleural effusions.  - 2D ECHO was ordered which revealed LV EF: 20-25% LV severely dilated, akinesis of the entire inferior/inferoseptal myocardium, basal-midanteroseptal myocardium, apicalanterior myocardium. Features are consistent with a pseudonormal left ventricular filling pattern, G2DD andhigh ventricular filling pressure.  - Troponin 0.03>0.04>0.04 likely demand ischemia in the setting of acute CHF. Likely from uncontrolled HTN and alcohol abuse. - Started on lisinopril  qd and Carvedilol 3.125mg  BID. - Started on IV lasix  BID with brisk diuresis. Net neg 6.1 L. Weight down 25 lbs? Doubt this much but weight appears to be trending downwards.  - 1-day NST performed with official read pending by [facility].  2. Uncontrolled HTN - he had previously been on antihypertensives but he has not taken any of these for over 2 years. - BPs better controlled on lisinopril  and IV lasix.   3. Alcohol/tobacco abuse-  he drinks 2 beers on work days and ~4 beers Thurs-Sun and has a 30 pack year hx  of smoking.  -Counseled on cessation   4. Possible PFO - a PFO could not be excluded on 2D ECHO and an agitated saline contrast study to rule out PFO was recommended.    Lorri Frederick , PA-C 9:26 AM 02/23/2015 Pager: [phone]   Attending Note:   The patient was seen and examined.  Agree with assessment and plan as noted above.  Changes made to the above note as needed.  myoview shows inferior defect , non reversible. Could be an infarct but also could be due to uptake in bowel below the diaphragm  "stealing counts" from the inferior wall.  I do think he needs a cath  Have discussed risks, benefits , options.  He understands and agrees to proceed.  Have scheduled it for 9 am -  right , left and possible PCI    Alvia Grove., MD, [facility] 02/23/2015, 12:57 PM [address]  [address] Office [phone] Pager [phone]

## 2015-02-24 NOTE — Progress Notes (Signed)
Pt to procedure area for cath procedure.

## 2015-02-24 NOTE — Progress Notes (Signed)
Subjective: Feeling well this afternoon following PCI. No complaints. Denies any CP or shortness of breath.  Objective: Vital signs in last 24 hours: Filed Vitals:   02/24/15 1235 02/24/15 1240 02/24/15 1245 02/24/15 1256  BP: 91/62 116/91 111/86 97/72  Pulse: 84 83 88 83  Temp:      TempSrc:      Resp: Height:      Weight:      SpO2: 100% 100% 99% 100%   Weight change: -1 lb (-0.454 kg)  Intake/Output Summary (Last 24 hours) at 02/24/15 1510 Last data filed at 02/24/15 1358  Gross per 24 hour  Intake  643.8 ml  Output    850 ml  Net -206.2 ml    PHYSICAL EXAM GENERAL- alert, co-operative, appears as stated age, not in any distress. HEENT- Atraumatic, normocephalic, PERRL, EOMI, oral mucosa appears moist CARDIAC- RRR, no murmurs, rubs or gallops. No JVD. RESP- Moving equal volumes of air, and clear to auscultation bilaterally, no wheezes or crackles. ABDOMEN- Soft, nontender, bowel sounds present. NEURO- No obvious Cr N abnormality. EXTREMITIES- pulse 2+, symmetric, trace pedal edema. SKIN- Warm, dry, No rash or lesion. PSYCH- Normal mood and affect, appropriate thought content and speech.  Medications: I have reviewed the patient's current medications. Scheduled Meds: . aspirin EC  81 mg Oral Daily  . carvedilol  3.125 mg Oral BID WC  . [START ON 02/25/2015] enoxaparin (LOVENOX) injection  40 mg Subcutaneous Q24H  . folic acid  1 mg Oral Daily  . furosemide  40 mg Intravenous Daily  . lisinopril  5 mg Oral Daily  . multivitamin with minerals  1 tablet Oral Daily  . regadenoson  0.4 mg Intravenous Once  . regadenoson  0.4 mg Intravenous Once  . sodium chloride  3 mL Intravenous Q12H  . sodium chloride  3 mL Intravenous Q12H  . sodium chloride  3 mL Intravenous Q12H  . sodium chloride  3 mL Intravenous Q12H  . thiamine  100 mg Oral Daily   Continuous Infusions:  PRN Meds:.sodium chloride, sodium chloride, sodium chloride, LORazepam **OR**  [DISCONTINUED] LORazepam, senna-docusate, sodium chloride, sodium chloride, sodium chloride Assessment/Plan:  Non-ischemic cardiomyopathy with acute exacerbation: Weight stable at120 lbs. Denies any further dyspnea. Went for PCI this morning. Severe LV systolic dysfunction, EF 15%. Normal coronary anatomy. Moderate Pulmonary HTN. Medical therapy recommended.  Likely 2/2 uncontrolled HTN and ETOH abuse.  -Appreciate cards recs -Start lasix 20 mg PO daily -A1c 5.7 -Lisinopril 5 mg daily -d/c ASA 81 mg daily -Coreg 3.125 mg PO bid -Strict I&O and daily weights  Epigastric pain: No complaints of further abdominal pain.  -RUQ US unremarkable -Senokot-S prn  HTN: Patient with history of HTN on no medications for the past 2 years after moving to [facility] and not establishing with any PCP. Previously on Lisinopril and HCTZ. BP better controlled after starting anti-hypertensives.  -Continue Lisinopril 5 mg daily  -Coreg 3.125 mg bid  Leukopenia: Stable. -HIV negative -CBC in am  ETOH abuse: Admits 3-4 beers daily. Denies any history of withdrawal or seizures. -CIWA protocol   FEN: NPO until after procedure  DVT PPx: Lovenox  Dispo: Disposition is deferred at this time, awaiting improvement of current medical problems.  Anticipated discharge in approximately 1-2 day(s).   The patient does not have a current PCP (No Pcp Per Patient) and does need an [facility] hospital follow-up appointment after discharge.  The patient does not have transportation limitations that hinder  transportation to clinic appointments.  .Services Needed at time of discharge: Y = Yes, Blank = No PT:   OT:   RN:   Equipment:   Other:     LOS: 3 days   Valentino Nose, MD IMTS PGY-1 [phone] 02/24/2015, 3:10 PM

## 2015-02-24 NOTE — Progress Notes (Signed)
Pt waiting in HA for cath procedure.  Pt alert and oriented and denies any discomfort at this time.

## 2015-02-25 LAB — POCT I-STAT 3, VENOUS BLOOD GAS (G3P V)
ACID-BASE EXCESS: 8 mmol/L — AB (ref 0.0–2.0)
Bicarbonate: 33.7 mEq/L — ABNORMAL HIGH (ref 20.0–24.0)
O2 SAT: 70 %
TCO2: 35 mmol/L (ref 0–100)
pCO2, Ven: 49.5 mmHg (ref 45.0–50.0)
pH, Ven: 7.442 — ABNORMAL HIGH (ref 7.250–7.300)
pO2, Ven: 36 mmHg (ref 30.0–45.0)

## 2015-02-25 LAB — POCT I-STAT 3, ART BLOOD GAS (G3+)
ACID-BASE EXCESS: 4 mmol/L — AB (ref 0.0–2.0)
Bicarbonate: 28.6 mEq/L — ABNORMAL HIGH (ref 20.0–24.0)
O2 SAT: 95 %
TCO2: 30 mmol/L (ref 0–100)
pCO2 arterial: 40.5 mmHg (ref 35.0–45.0)
pH, Arterial: 7.457 — ABNORMAL HIGH (ref 7.350–7.450)
pO2, Arterial: 70 mmHg — ABNORMAL LOW (ref 80.0–100.0)

## 2015-02-25 LAB — POCT ACTIVATED CLOTTING TIME: ACTIVATED CLOTTING TIME: 199 s

## 2015-03-01 NOTE — Telephone Encounter (Signed)
Left message for patient to call.

## 2015-03-02 NOTE — Telephone Encounter (Signed)
Attempted to call patient on both numbers, l/m on mobile number unable to leave message on home phone.

## 2015-03-03 ENCOUNTER — Encounter: Payer: Self-pay | Admitting: Internal Medicine

## 2015-03-03 ENCOUNTER — Ambulatory Visit (INDEPENDENT_AMBULATORY_CARE_PROVIDER_SITE_OTHER): Payer: Self-pay | Admitting: Internal Medicine

## 2015-03-03 VITALS — BP 113/81 | HR 81 | Temp 98.1°F | Ht 69.0 in | Wt 131.1 lb

## 2015-03-03 DIAGNOSIS — Z09 Encounter for follow-up examination after completed treatment for conditions other than malignant neoplasm: Secondary | ICD-10-CM

## 2015-03-03 DIAGNOSIS — I10 Essential (primary) hypertension: Secondary | ICD-10-CM

## 2015-03-03 DIAGNOSIS — I429 Cardiomyopathy, unspecified: Secondary | ICD-10-CM

## 2015-03-03 DIAGNOSIS — Z720 Tobacco use: Secondary | ICD-10-CM

## 2015-03-03 DIAGNOSIS — I428 Other cardiomyopathies: Secondary | ICD-10-CM

## 2015-03-03 DIAGNOSIS — I5021 Acute systolic (congestive) heart failure: Secondary | ICD-10-CM

## 2015-03-03 DIAGNOSIS — F17210 Nicotine dependence, cigarettes, uncomplicated: Secondary | ICD-10-CM

## 2015-03-03 DIAGNOSIS — I5022 Chronic systolic (congestive) heart failure: Secondary | ICD-10-CM

## 2015-03-03 HISTORY — DX: Other cardiomyopathies: I42.8

## 2015-03-03 MED ORDER — LISINOPRIL 2.5 MG PO TABS: 2.5000 mg | ORAL_TABLET | Freq: Every day | ORAL | Status: DC

## 2015-03-03 MED ORDER — CARVEDILOL 3.125 MG PO TABS: 3.1250 mg | ORAL_TABLET | Freq: Two times a day (BID) | ORAL | Status: DC

## 2015-03-03 MED ORDER — FUROSEMIDE 20 MG PO TABS: 20.0000 mg | ORAL_TABLET | Freq: Every day | ORAL | Status: DC

## 2015-03-03 NOTE — Assessment & Plan Note (Signed)
BP Readings from Last 3 Encounters:  03/03/15 113/81  02/24/15 120/94  06/09/14 106/60       Component Value Date/Time   NA 139 02/24/2015 0547   K 4.9 02/24/2015 0547   CREATININE 1.02 02/24/2015 1416    Assessment:  Patient with HTN compliant with 3 class (BB, ACE, diuretic) anti-hypertensive therapy who presents today with blood pressure of 113/81.  Denies headaches, chest pain, edema, dizziness or lightheadedness.  Does report occasional shortness of breath associated with smoking cigarettes.  Blood pressure control: controlled Progress towards BP goal: at goal Comments: patient recently discharged from hospital following HFrEF exacerbation who had not been on any BP medicine prior to admission.  Plan:   -Medication changes: none, 90 day prescriptions supply sent to [facility] due to cost  -Labs: none -Other plans: follow up with cardiology tomorrow. -Encourage home BP monitoring 3 times per week or at drug store occasionally  -Encourage regular exercise and healthy diet (decreased salt intake)  Medications after today's visit: 1. Lisinopril 2.5mg  daily 2. Furosemide 20mg  daily 3. Carvedilol 3.125mg  BID

## 2015-03-03 NOTE — Addendum Note (Signed)
Addended by: Kathlynn Grate on: 03/03/2015 12:52 PM   Modules accepted: Orders

## 2015-03-03 NOTE — Patient Instructions (Signed)
Thank you for coming to see me today. It was a pleasure. Today we talked about:   Medications:  - I have sent in a 90 day supply of your medications to [facility].  Your 3 medications are $10 each for this 90 day supply and so should cost you a total of $30  Smoking:  - Please think about quitting smoking.  This is very important for your health.  There are many things we can do to help you quit.  Let  us know if you are interested.  You can also call 1-800-QUIT-NOW (854)408-1868) for free smoking cessation counseling and nicotine patches and/or gum.   Heart Failure:  - Continue weighing yourself on a daily basis and let our office know if your weight starts to rise on 2 consecutive days  - Continue taking your medications as prescribed  - Follow up with your Cardiologist tomorrow  Please follow-up with me in 3 months  If you have any questions or concerns, please do not hesitate to call the office at [phone].  Take Care,   Gwynn Burly, DO

## 2015-03-03 NOTE — Assessment & Plan Note (Signed)
Assessment: Patient reports smoking 1 cigarette per day since discharge, but prior to that had been smoking about 1/2 PPD x 40 years.  States he is ready to quit especially after recent hospitalization.  Plan: - congratulated and encouraged continued progress towards cessation - information on access to the [place] Quitline provided to patient to receive free assistance

## 2015-03-03 NOTE — Progress Notes (Signed)
Patient ID: Darin Brady, male   DOB: 21-Mar-1955, 59 y.o.   MRN: [id number]   Subjective:   Patient ID: Darin Brady male   DOB: January 12, 1956 59 y.o.   MRN: [id number]  HPI: [name] is a 59 y.o. male with past medical history of HTN, EtOH abuse, NICM (EF 15% on R/L heart cath 02/24/2015), moderate pulmonary HTN presenting to [facility] today for hospital follow up in the setting of non-ischemic cardiomyopathy with acute exacerbation.  Please see A&P for status of patients chronic medical conditions addressed at today's visit.    Past Medical History  Diagnosis Date  . Hypertension     Previous anti-hypertensive meds but ran out  . Alcohol abuse    Current Outpatient Prescriptions  Medication Sig Dispense Refill  . carvedilol (COREG) 3.125 MG tablet Take 1 tablet (3.125 mg total) by mouth 2 (two) times daily with a meal. 60 tablet 2  . furosemide (LASIX) 20 MG tablet Take 1 tablet (20 mg total) by mouth daily. 30 tablet 2  . lisinopril (PRINIVIL,ZESTRIL) 2.5 MG tablet Take 1 tablet (2.5 mg total) by mouth daily. 30 tablet 2   No current facility-administered medications for this visit.   Family History  Problem Relation Age of Onset  . Asthma Mother    Social History   Social History  . Marital Status: Single    Spouse Name: N/A  . Number of Children: N/A  . Years of Education: N/A   Occupational History  . cafeteria cook    Social History Main Topics  . Smoking status: Current Every Day Smoker -- 0.50 packs/day  . Smokeless tobacco: None  . Alcohol Use: Yes     Comment: occasionally  . Drug Use: No  . Sexual Activity: Yes   Other Topics Concern  . None   Social History Narrative   Review of Systems: Review of Systems  Constitutional: Negative for fever and chills.  Eyes: Negative for blurred vision.  Respiratory: Positive for shortness of breath. Negative for cough and sputum production.   Cardiovascular: Negative for chest pain, orthopnea, leg swelling  and PND.  Gastrointestinal: Negative for nausea, vomiting, diarrhea and constipation.  Genitourinary: Negative for dysuria.  Musculoskeletal: Negative for falls.  Skin: Negative for rash.  Neurological: Negative for dizziness, loss of consciousness and headaches.  Psychiatric/Behavioral: Negative for depression.    Objective:  Physical Exam: Filed Vitals:   03/03/15 0838  BP: 113/81  Pulse: 81  Temp: 98.1 F (36.7 C)  TempSrc: Oral  Height: 5\' 9"  (1.753 m)  Weight: 131 lb 1.6 oz (59.467 kg)  SpO2: 100%   Physical Exam  Constitutional: He appears well-developed and well-nourished. No distress.  HENT:  Head: Normocephalic and atraumatic.  Eyes: EOM are normal.  Neck: Normal range of motion.  Cardiovascular: Normal rate and regular rhythm.   Pulmonary/Chest: Effort normal and breath sounds normal.  Musculoskeletal: He exhibits edema (trace pedal edema).  Skin: Skin is warm and dry.  Psychiatric: He has a normal mood and affect.    Assessment & Plan:  Please see problem list for assessment and plan.  Case discussed with Dr. Criselda Peaches.

## 2015-03-03 NOTE — Assessment & Plan Note (Signed)
Assessment: Patient recently discharged from hospital after acute heart failure exacerbation.  Left and right heart cath completed on 02/24/15 revealed clean coronaries, EF 15%, and moderate pulmonary HTN.  Medical management was elected and patient to be followed up as an outpatient.  Since discharge, he has done well with medical management on Lisinopril, Lasix, and Carvedilol.  He reports weighing himself daily at home with weights maintaining around 120 lbs, which is his dry weight.  Weight today in clinic is 131 but patient states this does not correlate with the weights he has on his home scale.  Patient concerned about cost of medications as he is uninsured and working at acquiring the [employer].  States he can afford prescriptions at [facility] as they are on the $4 list.  He has cardiology follow up scheduled for tomorrow.  On exam, lungs are clear, no JVD, only trace pedal edema, normal heart sounds.  Patient reports occasional shortness of breath but only when smoking cigarettes.  Plan: - follow up with cardiology - continue current medical management: Lisinopril 2.5mg  daily, Lasix 20mg  daily, and Carvedilol 3.125mg  BID - new prescriptions sent to [facility]

## 2015-03-03 NOTE — Assessment & Plan Note (Signed)
Assessment: Left and Right heart cath while hospitalized revealed NICM with EF of 15% and moderate pulmonary HTN.  Patient here for hospital follow up.  Doing well since discharge on medical management, has cardiology follow up tomorrow.  Plan: - f/u with cardiology - continue medical management

## 2015-03-04 ENCOUNTER — Encounter: Payer: Self-pay | Admitting: Cardiology

## 2015-03-04 DIAGNOSIS — R0989 Other specified symptoms and signs involving the circulatory and respiratory systems: Secondary | ICD-10-CM

## 2015-03-04 NOTE — Progress Notes (Signed)
  Encounter opened in error. Patient no showed to appointment.   Darin Brady

## 2015-03-09 NOTE — Progress Notes (Signed)
Internal Medicine Clinic Attending  I saw and evaluated the patient.  I personally confirmed the key portions of the history and exam documented by Dr. Wallace and I reviewed pertinent patient test results.  The assessment, diagnosis, and plan were formulated together and I agree with the documentation in the resident's note. 

## 2015-03-10 ENCOUNTER — Encounter: Payer: Self-pay | Admitting: Internal Medicine

## 2015-03-10 ENCOUNTER — Ambulatory Visit (INDEPENDENT_AMBULATORY_CARE_PROVIDER_SITE_OTHER): Payer: Self-pay | Admitting: Internal Medicine

## 2015-03-10 VITALS — BP 135/102 | HR 97 | Temp 98.2°F | Ht 69.0 in | Wt 128.5 lb

## 2015-03-10 DIAGNOSIS — I10 Essential (primary) hypertension: Secondary | ICD-10-CM

## 2015-03-10 DIAGNOSIS — I5022 Chronic systolic (congestive) heart failure: Secondary | ICD-10-CM

## 2015-03-10 DIAGNOSIS — Z0000 Encounter for general adult medical examination without abnormal findings: Secondary | ICD-10-CM

## 2015-03-10 DIAGNOSIS — Z720 Tobacco use: Secondary | ICD-10-CM

## 2015-03-10 DIAGNOSIS — I110 Hypertensive heart disease with heart failure: Secondary | ICD-10-CM

## 2015-03-10 DIAGNOSIS — F17210 Nicotine dependence, cigarettes, uncomplicated: Secondary | ICD-10-CM

## 2015-03-10 NOTE — Assessment & Plan Note (Signed)
Does not have insurance currently and will need to apply for the orange card. -needs flu vaccine, colonoscopy, hepC, zostavax, etc.

## 2015-03-10 NOTE — Patient Instructions (Signed)
Thank you for your visit today.   Please return to the internal medicine clinic in 3 month(s) or sooner if needed.     I have made the following additions/changes to your medications:  Continue current medications. Please keep your follow up appointment with cardiology.   Please be sure to bring all of your medications with you to every visit; this includes herbal supplements, vitamins, eye drops, and any over-the-counter medications.   Should you have any questions regarding your medications and/or any new or worsening symptoms, please be sure to call the clinic at [phone].   If you believe that you are suffering from a life threatening condition or one that may result in the loss of limb or function, then you should call 911 and proceed to the nearest Emergency Department.   Heart Failure Heart failure means your heart has trouble pumping blood. This makes it hard for your body to work well. Heart failure is usually a long-term (chronic) condition. You must take good care of yourself and follow your doctor's treatment plan. HOME CARE  Take your heart medicine as told by your doctor.  Do not stop taking medicine unless your doctor tells you to.  Do not skip any dose of medicine.  Refill your medicines before they run out.  Take other medicines only as told by your doctor or pharmacist.  Stay active if told by your doctor. The elderly and people with severe heart failure should talk with a doctor about physical activity.  Eat heart-healthy foods. Choose foods that are without trans fat and are low in saturated fat, cholesterol, and salt (sodium). This includes fresh or frozen fruits and vegetables, fish, lean meats, fat-free or low-fat dairy foods, whole grains, and high-fiber foods. Lentils and dried peas and beans (legumes) are also good choices.  Limit salt if told by your doctor.  Cook in a healthy way. Roast, grill, broil, bake, poach, steam, or stir-fry foods.  Limit  fluids as told by your doctor.  Weigh yourself every morning. Do this after you pee (urinate) and before you eat breakfast. Write down your weight to give to your doctor.  Take your blood pressure and write it down if your doctor tells you to.  Ask your doctor how to check your pulse. Check your pulse as told.  Lose weight if told by your doctor.  Stop smoking or chewing tobacco. Do not use gum or patches that help you quit without your doctor's approval.  Schedule and go to doctor visits as told.  Nonpregnant women should have no more than 1 drink a day. Men should have no more than 2 drinks a day. Talk to your doctor about drinking alcohol.  Stop illegal drug use.  Stay current with shots (immunizations).  Manage your health conditions as told by your doctor.  Learn to manage your stress.  Rest when you are tired.  If it is really hot outside:  Avoid intense activities.  Use air conditioning or fans, or get in a cooler place.  Avoid caffeine and alcohol.  Wear loose-fitting, lightweight, and light-colored clothing.  If it is really cold outside:  Avoid intense activities.  Layer your clothing.  Wear mittens or gloves, a hat, and a scarf when going outside.  Avoid alcohol.  Learn about heart failure and get support as needed.  Get help to maintain or improve your quality of life and your ability to care for yourself as needed. GET HELP IF:   You gain weight quickly.  You are more short of breath than usual.  You cannot do your normal activities.  You tire easily.  You cough more than normal, especially with activity.  You have any or more puffiness (swelling) in areas such as your hands, feet, ankles, or belly (abdomen).  You cannot sleep because it is hard to breathe.  You feel like your heart is beating fast (palpitations).  You get dizzy or light-headed when you stand up. GET HELP RIGHT AWAY IF:   You have trouble breathing.  There is a  change in mental status, such as becoming less alert or not being able to focus.  You have chest pain or discomfort.  You faint. MAKE SURE YOU:   Understand these instructions.  Will watch your condition.  Will get help right away if you are not doing well or get worse.   This information is not intended to replace advice given to you by your health care provider. Make sure you discuss any questions you have with your health care provider.   Document Released: 12/06/2007 Document Revised: 03/19/2014 Document Reviewed: 04/14/2012 Elsevier Interactive Patient Education [employer]

## 2015-03-10 NOTE — Progress Notes (Signed)
Case discussed with Dr. Delane Ginger soon after the resident saw the patient.  We reviewed the resident's history and exam and pertinent patient test results.  I agree with the assessment, diagnosis, and plan of care documented in the resident's note.  At the next visit, if the blood pressure is not at target we will make adjustments in his antihypertensive regimen rather than deferring to Cardiology.

## 2015-03-10 NOTE — Assessment & Plan Note (Addendum)
Pt currently asymptomatic.  He was concerned because he states he woke up this AM with facial swelling which has now resolved.  Has been compliant with medications and weight is stable today at 128lbs, last OV on 12/12 was 131lbs.  Unfortunately, he missed his appt with cardiology on 12/23.   -cont current meds -we rescheduled his cardiology appt for 1/12 -advised low sodium diet (2000mg /d) -cont to weigh daily

## 2015-03-10 NOTE — Assessment & Plan Note (Addendum)
BP 135/102. -cont current meds for now; is scheduled to see cardiology 1/12

## 2015-03-10 NOTE — Progress Notes (Signed)
Patient ID: Darin Brady, male   DOB: 10/23/1955, 59 y.o. MRN: [id number]     Subjective:   Patient ID: Darin Brady male    DOB: 26-Jul-1955 59 y.o.    MRN: [id number] Health Maintenance Due: Health Maintenance Due  Topic Date Due  . Hepatitis C Screening  [birth year]  . TETANUS/TDAP  02/13/1973  . COLONOSCOPY  02/14/2004  . ZOSTAVAX  02/13/2014  . INFLUENZA VACCINE  10/11/2014    _________________________________________________  HPI: [name] is a 59 y.o. male here for an acute visit.  Pt has a PMH outlined below.  Please see problem-based charting assessment and plan for further status of patient's chronic medical problems addressed at today's visit.  PMH: Past Medical History  Diagnosis Date  . Hypertension     Previous anti-hypertensive meds but ran out  . Alcohol abuse     Medications: Current Outpatient Prescriptions on File Prior to Visit  Medication Sig Dispense Refill  . carvedilol (COREG) 3.125 MG tablet Take 1 tablet (3.125 mg total) by mouth 2 (two) times daily with a meal. 180 tablet 2  . furosemide (LASIX) 20 MG tablet Take 1 tablet (20 mg total) by mouth daily. 90 tablet 2  . lisinopril (PRINIVIL,ZESTRIL) 2.5 MG tablet Take 1 tablet (2.5 mg total) by mouth daily. 90 tablet 2   No current facility-administered medications on file prior to visit.    Allergies: No Known Allergies  FH: Family History  Problem Relation Age of Onset  . Asthma Mother     SH: Social History   Social History  . Marital Status: Single    Spouse Name: N/A  . Number of Children: N/A  . Years of Education: N/A   Occupational History  . cafeteria cook    Social History Main Topics  . Smoking status: Current Every Day Smoker -- 0.50 packs/day  . Smokeless tobacco: None     Comment: 3-4 CIAGARETTES A DAY  . Alcohol Use: 0.0 oz/week    0 Standard drinks or equivalent per week     Comment: occasionally  . Drug Use: No  . Sexual Activity: Yes   Other  Topics Concern  . None   Social History Narrative    Review of Systems: Constitutional: Negative for fever, chills and weight loss.  Eyes: Negative for blurred vision.  Respiratory: Negative for cough and shortness of breath.  Cardiovascular: Negative for chest pain, palpitations and leg swelling.  Gastrointestinal: Negative for nausea, vomiting, abdominal pain, diarrhea, constipation and blood in stool.  Genitourinary: Negative for dysuria, urgency and frequency.  Musculoskeletal: Negative for myalgias and back pain.  Neurological: Negative for dizziness, weakness and headaches.     Objective:   Vital Signs: Filed Vitals:   03/10/15 1406  BP: 135/102  Pulse: 97  Temp: 98.2 F (36.8 C)  TempSrc: Oral  Height: 5\' 9"  (1.753 m)  Weight: 128 lb 8 oz (58.287 kg)  SpO2: 100%      BP Readings from Last 3 Encounters:  03/10/15 135/102  03/03/15 113/81  02/24/15 120/94    Physical Exam: Constitutional: Vital signs reviewed.  Patient is in NAD and cooperative with exam.  Head: Normocephalic and atraumatic. Eyes: EOMI, conjunctivae nl, no scleral icterus.  Neck: Supple. Cardiovascular: RRR, no MRG. Pulmonary/Chest: normal effort, CTAB. Abdominal: Soft. NT/ND +BS. Neurological: A&O x3, cranial nerves II-XII are grossly intact, moving all extremities. Extremities: 2+DP b/l; no pitting edema. Skin: Warm, dry and intact.    Assessment & Plan:   Assessment and  plan was discussed and formulated with my attending.

## 2015-03-10 NOTE — Assessment & Plan Note (Signed)
Still smoking about 3 cig/day.   -gave info on quit line and encouraged smoking cessation

## 2015-03-23 ENCOUNTER — Encounter (HOSPITAL_COMMUNITY): Payer: Self-pay | Admitting: Emergency Medicine

## 2015-03-23 ENCOUNTER — Emergency Department (HOSPITAL_COMMUNITY): Payer: Self-pay

## 2015-03-23 ENCOUNTER — Emergency Department (HOSPITAL_COMMUNITY)
Admission: EM | Admit: 2015-03-23 | Discharge: 2015-03-24 | Disposition: A | Discharge status: Home / Self Care | Payer: Self-pay | Attending: Emergency Medicine | Admitting: Emergency Medicine

## 2015-03-23 DIAGNOSIS — Z79899 Other long term (current) drug therapy: Secondary | ICD-10-CM | POA: Insufficient documentation

## 2015-03-23 DIAGNOSIS — R05 Cough: Secondary | ICD-10-CM | POA: Insufficient documentation

## 2015-03-23 DIAGNOSIS — I10 Essential (primary) hypertension: Secondary | ICD-10-CM | POA: Insufficient documentation

## 2015-03-23 DIAGNOSIS — R0602 Shortness of breath: Secondary | ICD-10-CM | POA: Insufficient documentation

## 2015-03-23 DIAGNOSIS — F17200 Nicotine dependence, unspecified, uncomplicated: Secondary | ICD-10-CM | POA: Insufficient documentation

## 2015-03-23 DIAGNOSIS — Z9889 Other specified postprocedural states: Secondary | ICD-10-CM | POA: Insufficient documentation

## 2015-03-23 DIAGNOSIS — R062 Wheezing: Secondary | ICD-10-CM | POA: Insufficient documentation

## 2015-03-23 DIAGNOSIS — R0609 Other forms of dyspnea: Secondary | ICD-10-CM | POA: Insufficient documentation

## 2015-03-23 LAB — I-STAT TROPONIN, ED: TROPONIN I, POC: 0.03 ng/mL (ref 0.00–0.08)

## 2015-03-23 LAB — CBC
HCT: 40.5 % (ref 39.0–52.0)
HEMOGLOBIN: 13.6 g/dL (ref 13.0–17.0)
MCH: 32.1 pg (ref 26.0–34.0)
MCHC: 33.6 g/dL (ref 30.0–36.0)
MCV: 95.5 fL (ref 78.0–100.0)
PLATELETS: 159 10*3/uL (ref 150–400)
RBC: 4.24 MIL/uL (ref 4.22–5.81)
RDW: 14.4 % (ref 11.5–15.5)
WBC: 3.4 10*3/uL — AB (ref 4.0–10.5)

## 2015-03-23 LAB — BASIC METABOLIC PANEL
ANION GAP: 11 (ref 5–15)
BUN: 13 mg/dL (ref 6–20)
CHLORIDE: 102 mmol/L (ref 101–111)
CO2: 25 mmol/L (ref 22–32)
CREATININE: 1 mg/dL (ref 0.61–1.24)
Calcium: 8.9 mg/dL (ref 8.9–10.3)
GFR calc non Af Amer: >60 mL/min (ref >60)
Glucose, Bld: 90 mg/dL (ref 65–99)
Potassium: 3.8 mmol/L (ref 3.5–5.1)
SODIUM: 138 mmol/L (ref 135–145)

## 2015-03-23 LAB — BRAIN NATRIURETIC PEPTIDE: B NATRIURETIC PEPTIDE 5: 1765.9 pg/mL — AB (ref 0.0–100.0)

## 2015-03-23 IMAGING — DX DG CHEST 2V
2 series · 2 of 2 positions shown · non-contrast
Comparison: 02/21/2015

CLINICAL DATA: Shortness of breath for 3 days, worse on exertion.
Productive cough.

EXAM:
CHEST  2 VIEW

[chest pa]
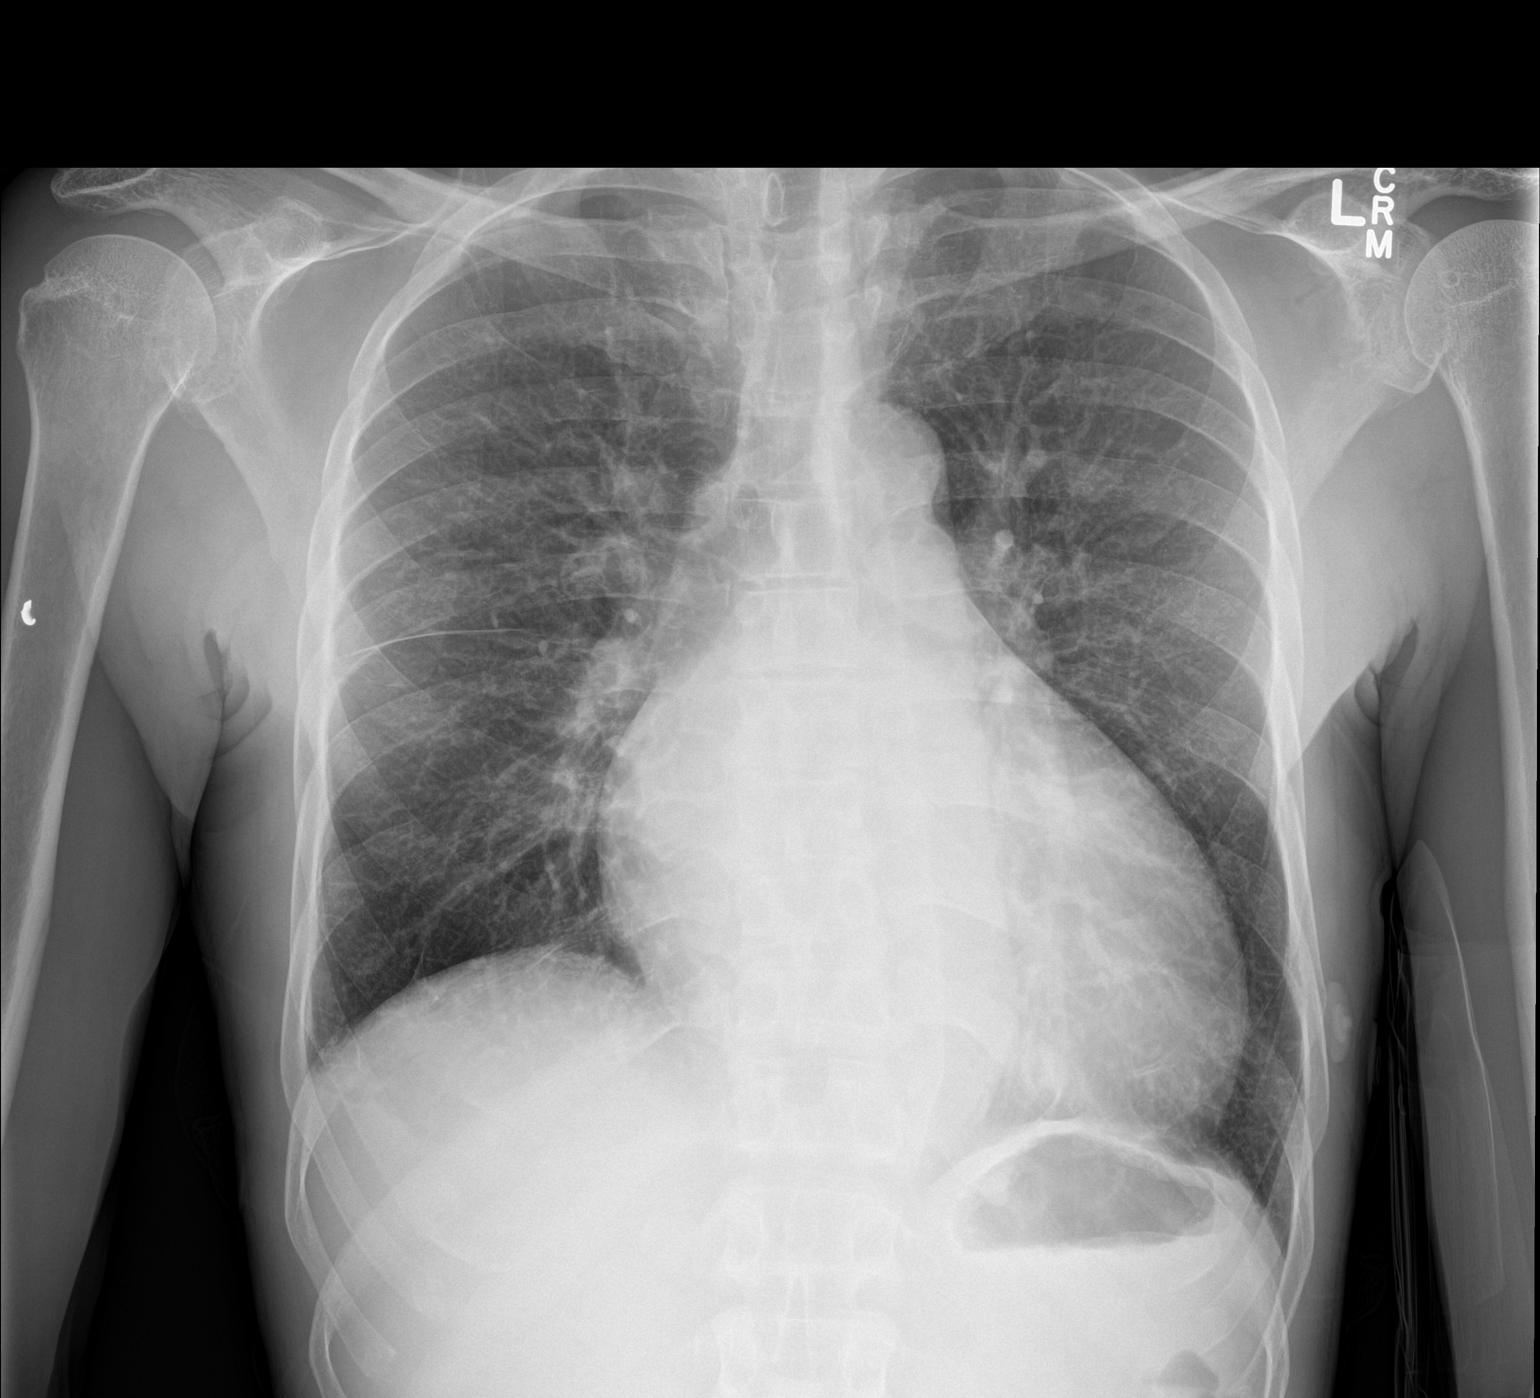

[chest lat]
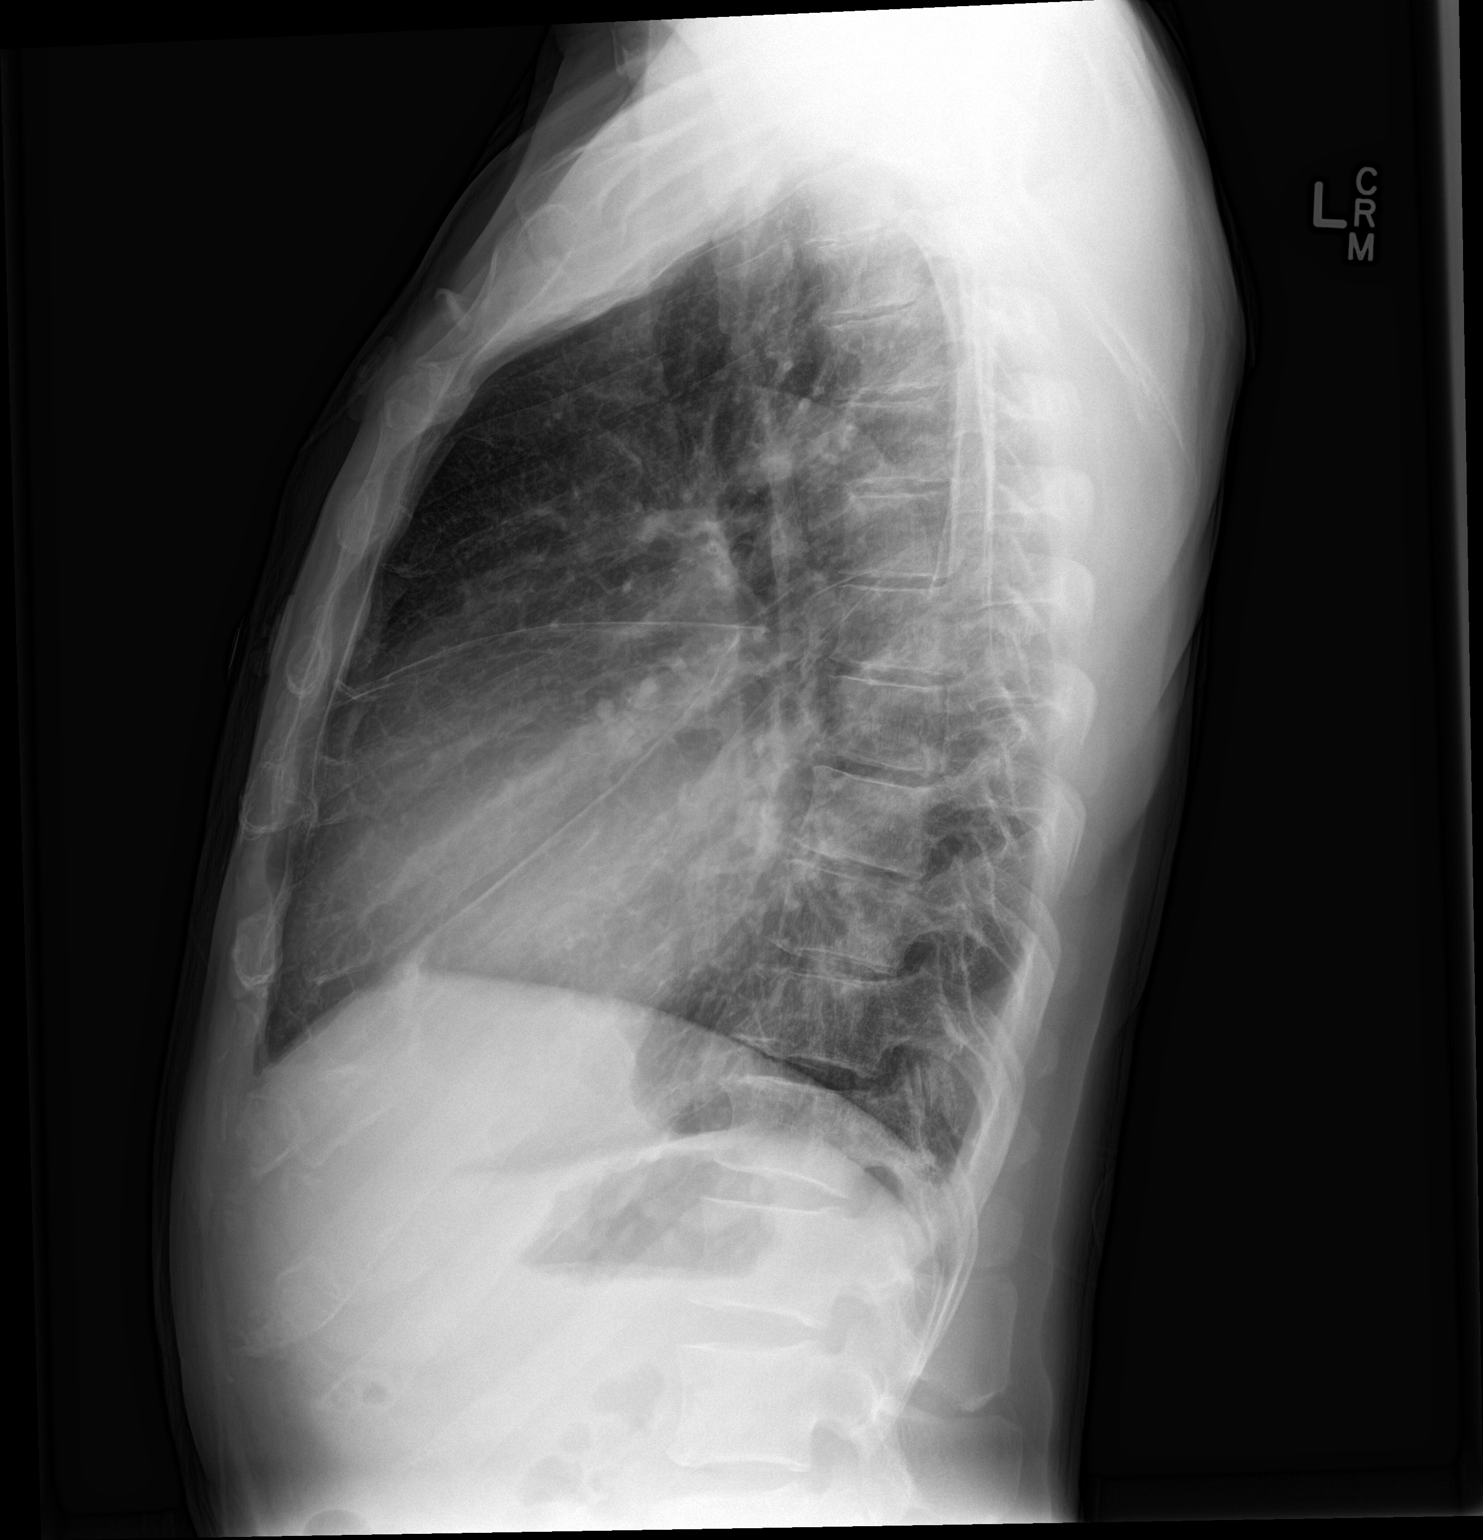

[2 of 2 positions shown; findings below may reference images not displayed]

FINDINGS: Cardiomegaly is unchanged. Persistent but improved pulmonary edema
from prior exam. Trace right pleural effusion, decreased from prior.
No confluent airspace disease. No pneumothorax. Osseous structures
are intact.
IMPRESSION: Persisting cardiomegaly. Improved but persistent pulmonary edema and
small right pleural effusion. No new abnormalities are seen.

## 2015-03-23 MED ORDER — FUROSEMIDE 10 MG/ML IJ SOLN
20.0000 mg | Freq: Once | INTRAMUSCULAR | Status: AC
  Administered 2015-03-24: 20 mg via INTRAVENOUS
  Filled 2015-03-23: qty 2

## 2015-03-23 MED ORDER — IPRATROPIUM-ALBUTEROL 0.5-2.5 (3) MG/3ML IN SOLN
3.0000 mL | Freq: Once | RESPIRATORY_TRACT | Status: AC
  Administered 2015-03-24: 3 mL via RESPIRATORY_TRACT
  Filled 2015-03-23: qty 3

## 2015-03-23 NOTE — ED Notes (Addendum)
Pt states he has been sob for the last 3 days worse on exertion. Denies any chest pain. Pt is warm and dry. Pt also has productive cough.

## 2015-03-23 NOTE — ED Provider Notes (Signed)
CSN: [id number]     Arrival date & time 03/23/15  1852 History   First MD Initiated Contact with Patient 03/23/15 2145     Chief Complaint  Patient presents with  . Shortness of Breath     (Consider location/radiation/quality/duration/timing/severity/associated sxs/prior Treatment) HPI Complains of shortness of breath and dyspnea on exertion and cough productive of yellowish sputum onset 3 days ago. Denies noncompliance with his vacation. Denies orthopnea. Denies fever. No treatment prior to coming here. No other associated symptoms. Denies any chest pain. No other associated symptoms Past Medical History  Diagnosis Date  . Hypertension     Previous anti-hypertensive meds but ran out  . Alcohol abuse    Past Surgical History  Procedure Laterality Date  . Clavicle surgery      15 years ago  . Stomach surgery      stop bleeding from a trauma  . Orif ankle fracture  02/03/2011    Procedure: OPEN REDUCTION INTERNAL FIXATION (ORIF) ANKLE FRACTURE;  Surgeon: Thera Flake., MD;  Location: [facility];  Service: Orthopedics;  Laterality: Left;  . Cardiac catheterization N/A 02/24/2015    Procedure: Right/Left Heart Cath and Coronary Angiography;  Surgeon: Peter M Swaziland, MD;  Location: [facility] INVASIVE CV LAB;  Service: Cardiovascular;  Laterality: N/A;   Family History  Problem Relation Age of Onset  . Asthma Mother    Social History  Substance Use Topics  . Smoking status: Current Every Day Smoker -- 0.50 packs/day  . Smokeless tobacco: None     Comment: 3-4 CIAGARETTES A DAY  . Alcohol Use: 0.0 oz/week    0 Standard drinks or equivalent per week     Comment: occasionally    Review of Systems  Constitutional: Negative.   HENT: Negative.   Respiratory: Positive for cough and shortness of breath.   Cardiovascular: Negative.   Gastrointestinal: Negative.   Musculoskeletal: Negative.   Skin: Negative.   Neurological: Negative.   Psychiatric/Behavioral: Negative.   All other systems  reviewed and are negative.     Allergies  Review of patient's allergies indicates no known allergies.  Home Medications   Prior to Admission medications   Medication Sig Start Date End Date Taking? Authorizing Provider  carvedilol (COREG) 3.125 MG tablet Take 1 tablet (3.125 mg total) by mouth 2 (two) times daily with a meal. 03/03/15  Yes Gwynn Burly, DO  furosemide (LASIX) 20 MG tablet Take 1 tablet (20 mg total) by mouth daily. 03/03/15  Yes Gwynn Burly, DO  lisinopril (PRINIVIL,ZESTRIL) 2.5 MG tablet Take 1 tablet (2.5 mg total) by mouth daily. 03/03/15  Yes Gwynn Burly, DO   BP 131/101 mmHg  Pulse 97  Temp(Src) 97.8 F (36.6 C) (Oral)  Resp 16  Ht 5\' 9"  (1.753 m)  Wt 140 lb (63.504 kg)  BMI 20.67 kg/m2  SpO2 98% Physical Exam  Constitutional: He appears well-developed and well-nourished.  HENT:  Head: Normocephalic and atraumatic.  Eyes: Conjunctivae are normal. Pupils are equal, round, and reactive to light.  Neck: Neck supple. JVD present. No tracheal deviation present. No thyromegaly present.  Cardiovascular: Normal rate and regular rhythm.   No murmur heard. Pulmonary/Chest: Effort normal.  Diffuse rhonchi, nor orthopnea. No respiratory distress  Abdominal: Soft. Bowel sounds are normal. He exhibits no distension. There is no tenderness.  Musculoskeletal: Normal range of motion. He exhibits no edema or tenderness.  Neurological: He is alert. No cranial nerve deficit. Coordination normal.  Skin: Skin is warm and dry. No  rash noted.  Psychiatric: He has a normal mood and affect.  Nursing note and vitals reviewed.   ED Course  Procedures (including critical care time) Labs Review Labs Reviewed  CBC - Abnormal; Notable for the following:    WBC 3.4 (*)    All other components within normal limits  BASIC METABOLIC PANEL  I-STAT TROPOININ, ED    Imaging Review Dg Chest 2 View  03/23/2015  CLINICAL DATA:  Shortness of breath for 3 days, worse on  exertion. Productive cough. EXAM: CHEST  2 VIEW COMPARISON:  02/21/2015 FINDINGS: Cardiomegaly is unchanged. Persistent but improved pulmonary edema from prior exam. Trace right pleural effusion, decreased from prior. No confluent airspace disease. No pneumothorax. Osseous structures are intact. IMPRESSION: Persisting cardiomegaly. Improved but persistent pulmonary edema and small right pleural effusion. No new abnormalities are seen. Electronically Signed   By: Rubye Oaks M.D.   On: 03/23/2015 19:34   chest x-ray viewed by me I have personally reviewed and evaluated these images and lab results as part of my medical decision-making.   EKG Interpretation   Date/Time:  Wednesday March 23 2015 19:01:40 EST Ventricular Rate:  100 PR Interval:  156 QRS Duration: 100 QT Interval:  360 QTC Calculation: 464 R Axis:   130 Text Interpretation:  Normal sinus rhythm Right atrial enlargement Left  ventricular hypertrophy with repolarization abnormality Lateral infarct ,  age undetermined Abnormal ECG No significant change since last tracing  Confirmed by Ethelda Chick  MD, Carlyne Keehan [phone]) on 03/23/2015 9:47:17 PM     I counseled patient for 5 minutes on smoking cessation. At 12:25 AM his breathing is improved while getting nebulizer treatment. He's also been administered intravenous Lasix. Patient signed out to Dr. Wilkie Aye 12:25 AM. Results for orders placed or performed during the hospital encounter of 03/23/15  Basic metabolic panel  Result Value Ref Range   Sodium 138 135 - 145 mmol/L   Potassium 3.8 3.5 - 5.1 mmol/L   Chloride 102 101 - 111 mmol/L   CO2 25 22 - 32 mmol/L   Glucose, Bld 90 65 - 99 mg/dL   BUN 13 6 - 20 mg/dL   Creatinine, Ser 6.04 0.61 - 1.24 mg/dL   Calcium 8.9 8.9 - 54.0 mg/dL   GFR calc non Af Amer >60 >60 mL/min   GFR calc Af Amer >60 >60 mL/min   Anion gap 11 5 - 15  CBC  Result Value Ref Range   WBC 3.4 (L) 4.0 - 10.5 K/uL   RBC 4.24 4.22 - 5.81 MIL/uL   Hemoglobin  13.6 13.0 - 17.0 g/dL   HCT 98.1 19.1 - 47.8 %   MCV 95.5 78.0 - 100.0 fL   MCH 32.1 26.0 - 34.0 pg   MCHC 33.6 30.0 - 36.0 g/dL   RDW 29.5 62.1 - 30.8 %   Platelets 159 150 - 400 K/uL  Brain natriuretic peptide  Result Value Ref Range   B Natriuretic Peptide 1765.9 (H) 0.0 - 100.0 pg/mL  I-stat troponin, ED (not at [facility])  Result Value Ref Range   Troponin i, poc 0.03 0.00 - 0.08 ng/mL   Comment 3           Dg Chest 2 View  03/23/2015  CLINICAL DATA:  Shortness of breath for 3 days, worse on exertion. Productive cough. EXAM: CHEST  2 VIEW COMPARISON:  02/21/2015 FINDINGS: Cardiomegaly is unchanged. Persistent but improved pulmonary edema from prior exam. Trace right pleural effusion, decreased from prior. No  confluent airspace disease. No pneumothorax. Osseous structures are intact. IMPRESSION: Persisting cardiomegaly. Improved but persistent pulmonary edema and small right pleural effusion. No new abnormalities are seen. Electronically Signed   By: Rubye Oaks M.D.   On: 03/23/2015 19:34   Nm Myocar Multi W/spect W/wall Motion / Ef  02/23/2015  CLINICAL DATA:  Chest pain and shortness of breath for the past 4-5 days. History of smoking. EXAM: MYOCARDIAL IMAGING WITH SPECT (REST AND PHARMACOLOGIC-STRESS) GATED LEFT VENTRICULAR WALL MOTION STUDY LEFT VENTRICULAR EJECTION FRACTION TECHNIQUE: Standard myocardial SPECT imaging was performed after resting intravenous injection of 10 mCi Tc-55m sestamibi. Subsequently, intravenous infusion of Lexiscan was performed under the supervision of the Cardiology staff. At peak effect of the drug, 30 mCi Tc-65m sestamibi was injected intravenously and standard myocardial SPECT imaging was performed. Quantitative gated imaging was also performed to evaluate left ventricular wall motion, and estimate left ventricular ejection fraction. COMPARISON:  Chest radiograph -02/21/2015 FINDINGS: Raw images: There as mild GI activity and diaphragmatic  attenuation, worse in the provided stress images. No significant chest wall attenuation. No significant patient motion artifact. Perfusion: There is a moderate to large sized territory of decreased perfusion involving the inferior wall of the left ventricle which minimally improves in the provided stress images worrisome for an area of prior infarction. Additionally, there is a matched area of slightly decreased perfusion involving the apical aspect of the lateral wall of the left ventricle also worrisome for mid area of prior infarction. No mismatched areas of perfusion to suggest pharmacologically induced ischemia. Wall Motion: Global hypokinesia with relative akinesia involving the inferior wall of the left ventricle at the location of suspected prior infarction. The left ventricle is markedly dilated. Left Ventricular Ejection Fraction: 18 % End diastolic volume 275 ml End systolic volume 226 ml IMPRESSION: 1. 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 2. No scintigraphic evidence of pharmacologically induced ischemia. 3. Severe dilatation of the left ventricle with global hypokinesia with relative akinesia involving the inferior wall of left ventricle at the location of suspected prior infarction. 4. Left ventricular ejection fraction -18%. 5. High-risk stress test findings*. *2012 Appropriate Use Criteria for Coronary Revascularization Focused Update: J Am Coll Cardiol. 2012;59(9):857-881. http://content.dementiazones.com.aspx?articleid=1201161 Electronically Signed   By: Simonne Come M.D.   On: 02/23/2015 12:23    MDM  BNP is unchanged from one month ago. I suspect that patient has mild chronic congestive heart failure and also has bronchitis given productive cough with yellowish sputum. Dx #1 acute dyspnea #2 tobacco  abuse Final diagnoses:  None        Doug Sou, MD 03/24/15 0030

## 2015-03-24 ENCOUNTER — Telehealth: Payer: Self-pay | Admitting: Cardiovascular Disease

## 2015-03-24 ENCOUNTER — Encounter: Payer: Self-pay | Admitting: Cardiology

## 2015-03-24 ENCOUNTER — Ambulatory Visit (INDEPENDENT_AMBULATORY_CARE_PROVIDER_SITE_OTHER): Payer: Self-pay | Admitting: Cardiology

## 2015-03-24 VITALS — BP 150/110 | HR 92 | Ht 69.0 in | Wt 128.8 lb

## 2015-03-24 DIAGNOSIS — I10 Essential (primary) hypertension: Secondary | ICD-10-CM

## 2015-03-24 DIAGNOSIS — I428 Other cardiomyopathies: Secondary | ICD-10-CM

## 2015-03-24 DIAGNOSIS — I5021 Acute systolic (congestive) heart failure: Secondary | ICD-10-CM

## 2015-03-24 DIAGNOSIS — I429 Cardiomyopathy, unspecified: Secondary | ICD-10-CM

## 2015-03-24 MED ORDER — PREDNISONE 20 MG PO TABS: 40.0000 mg | ORAL_TABLET | Freq: Every day | ORAL | Status: DC

## 2015-03-24 MED ORDER — ALBUTEROL SULFATE HFA 108 (90 BASE) MCG/ACT IN AERS
2.0000 | INHALATION_SPRAY | Freq: Once | RESPIRATORY_TRACT | Status: AC
  Administered 2015-03-24: 2 via RESPIRATORY_TRACT
  Filled 2015-03-24: qty 6.7

## 2015-03-24 MED ORDER — LISINOPRIL 5 MG PO TABS: 5.0000 mg | ORAL_TABLET | Freq: Every day | ORAL | Status: DC

## 2015-03-24 MED ORDER — ALBUTEROL SULFATE HFA 108 (90 BASE) MCG/ACT IN AERS: 2.0000 | INHALATION_SPRAY | RESPIRATORY_TRACT | Status: DC | PRN

## 2015-03-24 MED ORDER — CARVEDILOL 3.125 MG PO TABS: 6.2500 mg | ORAL_TABLET | Freq: Two times a day (BID) | ORAL | Status: DC

## 2015-03-24 NOTE — ED Notes (Addendum)
Pt was ambulated with a starting POX of 96% and remained between 96-100% during the walk.

## 2015-03-24 NOTE — Progress Notes (Signed)
03/24/2015 Darin Brady   04-Apr-1955  [id number]  Primary Physician Gwynn Burly, DO Primary Cardiologist: Dr. Allyson Sabal   Reason for Visit/CC: [facility] F/u for Acute Systolic CHF  HPI:  60 year old AAM with h/o HTN, tobacco abuse and recent diagnosis of systolic CHF, who presents to clinic for post hospital f/u. He was admitted to [facility] for progressive dyspnea consistent with acute systolic CHF. Echo showed diffuse hypokinesis with an EF around 15-20 percent. He had a stress test that was intermediate risk. He then underwent a left heart catheterization which showed clean coronaries and no ischemic disease. He diuresed well during this admission and was discharged with oral Lasix, carvedilol, and lisinopril.   He presents to clinic for f/u. He reports that he has done fairly well but developed acute recurrent dyspnea last night and as a result, reported to the ED. BNP was check and showed a downward trend, from his level during prior admission. He was felt to have underlying COPD, and this was felt to be the likely cause of his dyspnea and wheezing. He was released from the ED and given a Rx for Prednisone, but has yet to get it filled. He plans on going to the pharmacy following this appointment to pick up meds. He denies any weight gain or edema. He has avoided high sodium foods. He continues to smoke, unfortunately. He reports full medication compliance. His BP is high today at 150/110. Pulse rate is 92 bpm. He denies syncope/ near syncope or palpitations.    Current Outpatient Prescriptions  Medication Sig Dispense Refill  . albuterol (PROVENTIL HFA;VENTOLIN HFA) 108 (90 Base) MCG/ACT inhaler Inhale 2 puffs into the lungs every 4 (four) hours as needed for wheezing or shortness of breath. 1 Inhaler 0  . carvedilol (COREG) 3.125 MG tablet Take 1 tablet (3.125 mg total) by mouth 2 (two) times daily with a meal. 180 tablet 2  . furosemide (LASIX) 20 MG tablet Take 1 tablet (20 mg total) by  mouth daily. 90 tablet 2  . lisinopril (PRINIVIL,ZESTRIL) 2.5 MG tablet Take 1 tablet (2.5 mg total) by mouth daily. 90 tablet 2  . predniSONE (DELTASONE) 20 MG tablet Take 2 tablets (40 mg total) by mouth daily with breakfast. 10 tablet 0   No current facility-administered medications for this visit.    No Known Allergies  Social History   Social History  . Marital Status: Single    Spouse Name: N/A  . Number of Children: N/A  . Years of Education: N/A   Occupational History  . cafeteria cook    Social History Main Topics  . Smoking status: Current Every Day Smoker -- 0.50 packs/day  . Smokeless tobacco: Not on file     Comment: 3-4 CIAGARETTES A DAY  . Alcohol Use: 0.0 oz/week    0 Standard drinks or equivalent per week     Comment: occasionally  . Drug Use: No  . Sexual Activity: Yes   Other Topics Concern  . Not on file   Social History Narrative     Review of Systems: General: negative for chills, fever, night sweats or weight changes.  Cardiovascular: negative for chest pain, dyspnea on exertion, edema, orthopnea, palpitations, paroxysmal nocturnal dyspnea or shortness of breath Dermatological: negative for rash Respiratory: negative for cough or wheezing Urologic: negative for hematuria Abdominal: negative for nausea, vomiting, diarrhea, bright red blood per rectum, melena, or hematemesis Neurologic: negative for visual changes, syncope, or dizziness All other systems reviewed and are otherwise negative  except as noted above.    Blood pressure 150/110, pulse 92, height  (1.753 m), weight 128 lb 12.8 oz (58.423 kg), SpO2 95 %.  General appearance: alert, cooperative and no distress Neck: no carotid bruit and no JVD Lungs: clear to auscultation bilaterally Heart: regular rate and rhythm, S1, S2 normal, no murmur, click, rub or gallop Extremities: no LEE Pulses: 2+ and symmetric Skin: warm and dry Neurologic: Grossly normal  EKG not  performed  ASSESSMENT AND PLAN:   1. Chronic Systolic CHF/ NICM: EF 15-20%. LHC showed normal coronaries. He is euvolemic on physical exam. Continue medical therapy with Lasix, Coreg and lisinopril. Given an elevated BP and HR, we will increase Coreg to 6.25 mg BID and lisinopril to 5 mg daily. We will recheck BP and HR in 2 weeks and further titrate HF meds if able. Plan is for 3 months of guidelines directed medical therapy for HF, followed by repeat EF assessment by 2D echo to see if he will need an ICD. He was advised to monitor weight daily, especially with Prednisone use, and to maintain a low sodium diet. Smoking cessation strongly encouraged.   2. ? COPD: patient was given Rx for prednisone in ED last night.  Patient advised to f/u with PCP for further management. Patient advised to monitor weight closely given prednisone use in the setting of CHF. Smoking cessation advised.   3. HTN: increase Coreg to 6.25 mg BID and lisinopril to 5 mg daily, as outlined above.   PLAN  F/u in 2 weeks for medication titration.   [name] PA-C 03/24/2015 9:37 AM

## 2015-03-24 NOTE — Patient Instructions (Signed)
Medication Instructions:  Your physician has recommended you make the following change in your medication:  1) INCREASE Carvedilol to 6.25mg  twice daily. An Rx has been sent to your pharmacy 2) INCREASE Lisinopril to 5mg  daily. An Rx has been sent to your pharmacy   Labwork: None ordered  Testing/Procedures: None ordered  Follow-Up: Your physician recommends that you schedule a follow-up appointment in: 2-3 weeks with an APP   Any Other Special Instructions Will Be Listed Below (If Applicable). Your physician recommends that you weigh, daily, at the same time every day, and in the same amount of clothing. Please record your daily weights.  Please follow a low sodium diet      If you need a refill on your cardiac medications before your next appointment, please call your pharmacy.

## 2015-03-24 NOTE — Discharge Instructions (Signed)
You were seen today for shortness of breath. This may be a combination of volume overload and smoking. Darin Brady'll be given an inhaler and steroids. You need to follow-up closely with Darin Brady cardiologist as previously scheduled. You need to stop smoking.  Shortness of Breath Shortness of breath means you have trouble breathing. It could also mean that you have a medical problem. You should get immediate medical care for shortness of breath. CAUSES   Not enough oxygen in the air such as with high altitudes or a smoke-filled room.  Certain lung diseases, infections, or problems.  Heart disease or conditions, such as angina or heart failure.  Low red blood cells (anemia).  Poor physical fitness, which can cause shortness of breath when you exercise.  Chest or back injuries or stiffness.  Being overweight.  Smoking.  Anxiety, which can make you feel like you are not getting enough air. DIAGNOSIS  Serious medical problems can often be found during your physical exam. Tests may also be done to determine why you are having shortness of breath. Tests may include:  Chest X-rays.  Lung function tests.  Blood tests.  An electrocardiogram (ECG).  An ambulatory electrocardiogram. An ambulatory ECG records your heartbeat patterns over a 24-hour period.  Exercise testing.  A transthoracic echocardiogram (TTE). During echocardiography, sound waves are used to evaluate how blood flows through your heart.  A transesophageal echocardiogram (TEE).  Imaging scans. Your health care provider may not be able to find a cause for your shortness of breath after your exam. In this case, it is important to have a follow-up exam with your health care provider as directed.  TREATMENT  Treatment for shortness of breath depends on the cause of your symptoms and can vary greatly. HOME CARE INSTRUCTIONS   Do not smoke. Smoking is a common cause of shortness of breath. If you smoke, ask for help to quit.  Avoid  being around chemicals or things that may bother your breathing, such as paint fumes and dust.  Rest as needed. Slowly resume your usual activities.  If medicines were prescribed, take them as directed for the full length of time directed. This includes oxygen and any inhaled medicines.  Keep all follow-up appointments as directed by your health care provider. SEEK MEDICAL CARE IF:   Your condition does not improve in the time expected.  You have a hard time doing your normal activities even with rest.  You have any new symptoms. SEEK IMMEDIATE MEDICAL CARE IF:   Your shortness of breath gets worse.  You feel light-headed, faint, or develop a cough not controlled with medicines.  You start coughing up blood.  You have pain with breathing.  You have chest pain or pain in your arms, shoulders, or abdomen.  You have a fever.  You are unable to walk up stairs or exercise the way you normally do. MAKE SURE YOU:  Understand these instructions.  Will watch your condition.  Will get help right away if you are not doing well or get worse.   This information is not intended to replace advice given to you by your health care provider. Make sure you discuss any questions you have with your health care provider.   Document Released: 11/21/2000 Document Revised: 03/03/2013 Document Reviewed: 05/14/2011 Elsevier Interactive Patient Education [employer]

## 2015-03-24 NOTE — Telephone Encounter (Signed)
I talked to patient and his mother Patient was going to wait until tomorrow to pick up prednisone; I told them both to pick it up today and start taking because this is what was going to improve his breathing and cough Would like to have something for cough.  Was thinking of Delsym over the counter.  Darin Brady saw him today I will ask her if she has any suggestion for a cough medication

## 2015-03-24 NOTE — ED Provider Notes (Signed)
Patient signed out by Dr. Rennis Chris.  On recheck, patient reports improvement of shortness of breath after DuoNeb. He continues to have wheezing. He was given an albuterol inhaler. He is a daily smoker. Suspect he may have undiagnosed COPD. He also has some evidence of volume overload and has cardiology all up tomorrow. He was able to ambulate and maintain pulse ox greater than 96%. Patient was discharged home with an inhaler and steroids. Close follow-up with cardiology. He was encouraged to quit smoking.  After history, exam, and medical workup I feel the patient has been appropriately medically screened and is safe for discharge home. Pertinent diagnoses were discussed with the patient. Patient was given return precautions.   Shon Baton, MD 03/24/15 [phone]

## 2015-03-24 NOTE — Telephone Encounter (Signed)
Pt's mother called here stating that the pt was seen by [facility] today . He is now home but she says that he is coughing badly and he has SOB . She is concerned and does not know what to do. Please f/u with her  Thanks

## 2015-04-01 ENCOUNTER — Ambulatory Visit (INDEPENDENT_AMBULATORY_CARE_PROVIDER_SITE_OTHER): Payer: Self-pay | Admitting: Internal Medicine

## 2015-04-01 ENCOUNTER — Encounter: Payer: Self-pay | Admitting: Internal Medicine

## 2015-04-01 VITALS — BP 129/97 | HR 91 | Temp 98.7°F | Ht 69.0 in | Wt 131.9 lb

## 2015-04-01 DIAGNOSIS — R059 Cough, unspecified: Secondary | ICD-10-CM | POA: Insufficient documentation

## 2015-04-01 DIAGNOSIS — H838X1 Other specified diseases of right inner ear: Secondary | ICD-10-CM

## 2015-04-01 DIAGNOSIS — H65191 Other acute nonsuppurative otitis media, right ear: Secondary | ICD-10-CM

## 2015-04-01 DIAGNOSIS — H6690 Otitis media, unspecified, unspecified ear: Secondary | ICD-10-CM | POA: Insufficient documentation

## 2015-04-01 DIAGNOSIS — R05 Cough: Secondary | ICD-10-CM

## 2015-04-01 DIAGNOSIS — F17201 Nicotine dependence, unspecified, in remission: Secondary | ICD-10-CM

## 2015-04-01 HISTORY — DX: Otitis media, unspecified, unspecified ear: H66.90

## 2015-04-01 HISTORY — DX: Cough, unspecified: R05.9

## 2015-04-01 MED ORDER — AMOXICILLIN 500 MG PO CAPS: 500.0000 mg | ORAL_CAPSULE | Freq: Two times a day (BID) | ORAL | Status: DC

## 2015-04-01 MED ORDER — GUAIFENESIN ER 600 MG PO TB12: 600.0000 mg | ORAL_TABLET | Freq: Two times a day (BID) | ORAL | Status: DC

## 2015-04-01 MED ORDER — GUAIFENESIN ER 600 MG PO TB12: 600.0000 mg | ORAL_TABLET | Freq: Two times a day (BID) | ORAL | Status: DC | PRN

## 2015-04-01 MED ORDER — AMOXICILLIN 500 MG PO CAPS: 500.0000 mg | ORAL_CAPSULE | Freq: Three times a day (TID) | ORAL | Status: DC

## 2015-04-01 NOTE — Assessment & Plan Note (Addendum)
Assessment:  Right ear congestion, erythematous TM c/w AOM.  Plan:  Will trx with amoxicillin 500mg  q12h x 7 days. He will return to clinic or seek emergency care if it worsens, if he loses hearing or develops systemic symptoms.

## 2015-04-01 NOTE — Patient Instructions (Signed)
1. Please take antibiotic (amoxicillin) twice per day for the next 7 days.  Come back to clinic if your ear discomfort does not get better, you lose hearing or if you start to feel worse.   2. Please take all medications as prescribed.    3. If you have worsening of your symptoms or new symptoms arise, please call the clinic ([phone]), or go to the ER immediately if symptoms are severe.  Otitis Media, Adult Otitis media is redness, soreness, and inflammation of the middle ear. Otitis media may be caused by allergies or, most commonly, by infection. Often it occurs as a complication of the common cold. SIGNS AND SYMPTOMS Symptoms of otitis media may include:  Earache.  Fever.  Ringing in your ear.  Headache.  Leakage of fluid from the ear. DIAGNOSIS To diagnose otitis media, your health care provider will examine your ear with an otoscope. This is an instrument that allows your health care provider to see into your ear in order to examine your eardrum. Your health care provider also will ask you questions about your symptoms. TREATMENT  Typically, otitis media resolves on its own within 3-5 days. Your health care provider may prescribe medicine to ease your symptoms of pain. If otitis media does not resolve within 5 days or is recurrent, your health care provider may prescribe antibiotic medicines if he or she suspects that a bacterial infection is the cause. HOME CARE INSTRUCTIONS   If you were prescribed an antibiotic medicine, finish it all even if you start to feel better.  Take medicines only as directed by your health care provider.  Keep all follow-up visits as directed by your health care provider. SEEK MEDICAL CARE IF:  You have otitis media only in one ear, or bleeding from your nose, or both.  You notice a lump on your neck.  You are not getting better in 3-5 days.  You feel worse instead of better. SEEK IMMEDIATE MEDICAL CARE IF:   You have pain that is not  controlled with medicine.  You have swelling, redness, or pain around your ear or stiffness in your neck.  You notice that part of your face is paralyzed.  You notice that the bone behind your ear (mastoid) is tender when you touch it. MAKE SURE YOU:   Understand these instructions.  Will watch your condition.  Will get help right away if you are not doing well or get worse.   This information is not intended to replace advice given to you by your health care provider. Make sure you discuss any questions you have with your health care provider.   Document Released: 12/02/2003 Document Revised: 03/19/2014 Document Reviewed: 09/23/2012 Elsevier Interactive Patient Education [employer]

## 2015-04-01 NOTE — Assessment & Plan Note (Addendum)
Assessment:  Cough x a few weeks that has gradually been improving.  He was seen in ED last week and given steroid course and albuterol inhaler.  Recent notes indicate that he is a smoker but he tells me he has quit and I commended him on this.  The cough is possibly 2/2 to URI and seems to be improving per pt. Plan:  He is improving.  I encouraged smoking cessation.  Symptomatic relief - rx mucinex.  He is getting abx for his ear infx.  If cough persists, consider PFTs and/or d/c ACEI and switch to ARB (he will need one of these agents due to HFrEF of 20-25%).

## 2015-04-01 NOTE — Progress Notes (Signed)
Subjective:    Patient ID: Darin Brady, male    DOB: [birth year], 60 y.o.   MRN: [id number]  HPI Comments: Darin Brady is a 60 year old male with PMH as below with c/o cough x a few weeks and ear congestion x 2 days.  He denies ear pain, drainage or feeling a pop.  He otherwise feels well, normal appetite, going to work, no fevers.   Cough This is a new problem. The current episode started in the past 7 days. The problem has been gradually improving. The cough is productive of sputum. Associated symptoms include ear congestion. Pertinent negatives include no chills, ear pain, fever, hemoptysis, postnasal drip, rhinorrhea, sore throat or shortness of breath. He has tried oral steroids, OTC cough suppressant and a beta-agonist inhaler for the symptoms. The treatment provided mild relief. There is no history of asthma or COPD.     Past Medical History  Diagnosis Date  . Hypertension     Previous anti-hypertensive meds but ran out  . Alcohol abuse    Current Outpatient Prescriptions on File Prior to Visit  Medication Sig Dispense Refill  . albuterol (PROVENTIL HFA;VENTOLIN HFA) 108 (90 Base) MCG/ACT inhaler Inhale 2 puffs into the lungs every 4 (four) hours as needed for wheezing or shortness of breath. 1 Inhaler 0  . carvedilol (COREG) 3.125 MG tablet Take 2 tablets (6.25 mg total) by mouth 2 (two) times daily with a meal. 180 tablet 2  . furosemide (LASIX) 20 MG tablet Take 1 tablet (20 mg total) by mouth daily. 90 tablet 2  . lisinopril (PRINIVIL,ZESTRIL) 5 MG tablet Take 1 tablet (5 mg total) by mouth daily. 90 tablet 2  . predniSONE (DELTASONE) 20 MG tablet Take 2 tablets (40 mg total) by mouth daily with breakfast. 10 tablet 0   No current facility-administered medications on file prior to visit.    Review of Systems  Constitutional: Negative for fever, chills and appetite change.  HENT: Negative for ear discharge, ear pain, hearing loss, postnasal drip, rhinorrhea, sinus  pressure, sneezing, sore throat and tinnitus.        Hears water running in both ears.   Respiratory: Positive for cough. Negative for hemoptysis and shortness of breath.   Neurological: Negative for syncope and light-headedness.        Objective:   Physical Exam  Constitutional: He is oriented to person, place, and time. He appears well-developed. No distress.  HENT:  Head: Normocephalic and atraumatic.  Right Ear: External ear normal.  Left Ear: External ear normal.  Nose: Nose normal.  Mouth/Throat: Oropharynx is clear and moist. No oropharyngeal exudate.  Right TM - TM obscured by wax.  Ear cleaned with curette and then irrigated. TM appears erythematous.  Left TM normal. There is no tragus, pinna or mastoid tenderness. There is no sinus tenderness.  Eyes: EOM are normal. Pupils are equal, round, and reactive to light. Right eye exhibits no discharge. Left eye exhibits no discharge.  Neck: Normal range of motion. Neck supple.  Cardiovascular: Normal rate, regular rhythm and normal heart sounds.  Exam reveals no gallop and no friction rub.   No murmur heard. Pulmonary/Chest: Effort normal and breath sounds normal. No respiratory distress. He has no wheezes. He has no rales.  Abdominal: Soft. Bowel sounds are normal. He exhibits no distension and no mass. There is no tenderness. There is no rebound and no guarding.  Musculoskeletal: Normal range of motion. He exhibits no edema or tenderness.  Lymphadenopathy:  He has no cervical adenopathy.  Neurological: He is alert and oriented to person, place, and time. No cranial nerve deficit.  Skin: Skin is warm. He is not diaphoretic.  Psychiatric: He has a normal mood and affect. His behavior is normal. Judgment and thought content normal.  Vitals reviewed.         Assessment & Plan:  Please see problem based charting for A&P.

## 2015-04-04 NOTE — Progress Notes (Signed)
Internal Medicine Clinic Attending  I saw and evaluated the patient.  I personally confirmed the key portions of the history and exam documented by Dr. Wilson and I reviewed pertinent patient test results.  The assessment, diagnosis, and plan were formulated together and I agree with the documentation in the resident's note. 

## 2015-04-15 ENCOUNTER — Encounter: Payer: Self-pay | Admitting: Physician Assistant

## 2015-04-15 ENCOUNTER — Ambulatory Visit (INDEPENDENT_AMBULATORY_CARE_PROVIDER_SITE_OTHER): Payer: Self-pay | Admitting: Physician Assistant

## 2015-04-15 VITALS — BP 140/92 | HR 96 | Ht 69.0 in | Wt 124.4 lb

## 2015-04-15 DIAGNOSIS — I5021 Acute systolic (congestive) heart failure: Secondary | ICD-10-CM

## 2015-04-15 DIAGNOSIS — D72819 Decreased white blood cell count, unspecified: Secondary | ICD-10-CM

## 2015-04-15 DIAGNOSIS — I5042 Chronic combined systolic (congestive) and diastolic (congestive) heart failure: Secondary | ICD-10-CM

## 2015-04-15 DIAGNOSIS — F1010 Alcohol abuse, uncomplicated: Secondary | ICD-10-CM

## 2015-04-15 DIAGNOSIS — I429 Cardiomyopathy, unspecified: Secondary | ICD-10-CM

## 2015-04-15 DIAGNOSIS — Z720 Tobacco use: Secondary | ICD-10-CM

## 2015-04-15 DIAGNOSIS — I10 Essential (primary) hypertension: Secondary | ICD-10-CM

## 2015-04-15 DIAGNOSIS — I428 Other cardiomyopathies: Secondary | ICD-10-CM

## 2015-04-15 MED ORDER — CARVEDILOL 12.5 MG PO TABS: 12.5000 mg | ORAL_TABLET | Freq: Two times a day (BID) | ORAL | Status: DC

## 2015-04-15 NOTE — Patient Instructions (Signed)
Ronie Spies, PA-C has recommended making the following medication changes: INCREASE Carvedilol (Coreg) to 12.5 mg  Dayna recommends that you schedule a follow-up appointment in 2 months with Dr Allyson Sabal.  If you need a refill on your cardiac medications before your next appointment, please call your pharmacy.    Please check you blood pressure daily. Call the office is your systolic (the top number) blood pressure is consistently less than 100 or greater than 120.

## 2015-04-15 NOTE — Progress Notes (Addendum)
Cardiology Office Note Date:  04/15/2015  Patient ID:  Darin Brady, Darin Brady 1955/06/15, MRN [id number] PCP:  Gwynn Burly, DO  Cardiologist:  Allyson Sabal  Chief Complaint: f/u CHF  History of Present Illness: Darin Brady is a 60 y.o. male with history of HTN, tobacco abuse, recently diagnosed NICM/chronic combined CHF (LHC 1/22016: normal cors, possibly due to HTN/alcohol abuse), tobacco abuse, moderate pulm HTN by cath 02/2015 who presents for f/u CHF/med titration. He was admitted in December 2016 and found to have new onset CHF in setting of running out of HTN medications and alcohol abuse. 2D Echo 02/22/15 showed EF 20-25%, multiple WMA, grade 2 DD, aortic sclerosis, mild MR, severely dilated LA, cannot exclude PFO. TSH & HIV were negative. LDL 48, WBC persistently slightly low.   At last OV with [name] PA-C, Coreg was increased to 6.25mg  BID and lisinopril to 5mg  daily. Unfortunately he did not realize he was supposed to double up his Coreg 3.125mg  tablets and has instead still been just taking 1 tablet BID. Otherwise he has been compliant with medications. He has been weighing himself daily and has not noticed any weight gain. Overall he has been feeling well. No CP, SOB, dizziness, palpitations or syncope. He continues to drink 2-3 sixteen-ounce beers daily. He says he was in rehab many years ago but doesn't really have an interest in it now - he feels like this is something he'll just have to put his mind to in order to stop. We had a very frank discussion about how alcohol will continue to poison his heart long term. He quit smoking 22 days ago. He denies illicit drug use. Initial BP 120/100 with recheck 140/92 by me.   Past Medical History  Diagnosis Date  . Essential hypertension   . Alcohol abuse   . NICM (nonischemic cardiomyopathy) (HCC)     a. LHC 1/22016: normal cors.  . Chronic combined systolic and diastolic CHF (congestive heart failure) (HCC)     a. 2D Echo  02/22/15 showed EF 20-25%, multiple WMA, grade 2 DD, aortic sclerosis, mild MR, severely dilated LA, cannot exclude PFO.  . Tobacco abuse   . Pulmonary hypertension (HCC)     a. moderate pulm HTN by cath 02/2015.  Marland Kitchen Leukopenia     a. Noted on labs, unclear what prior w/u.    Past Surgical History  Procedure Laterality Date  . Clavicle surgery      15 years ago  . Stomach surgery      stop bleeding from a trauma  . Orif ankle fracture  02/03/2011    Procedure: OPEN REDUCTION INTERNAL FIXATION (ORIF) ANKLE FRACTURE;  Surgeon: Thera Flake., MD;  Location: [facility] ORS;  Service: Orthopedics;  Laterality: Left;  . Cardiac catheterization N/A 02/24/2015    Procedure: Right/Left Heart Cath and Coronary Angiography;  Surgeon: Peter M Swazil[place], MD;  Location: [facility] INVASIVE CV LAB;  Service: Cardiovascular;  Laterality: N/A;    Current Outpatient Prescriptions  Medication Sig Dispense Refill  . albuterol (PROVENTIL HFA;VENTOLIN HFA) 108 (90 Base) MCG/ACT inhaler Inhale 2 puffs into the lungs every 4 (four) hours as needed for wheezing or shortness of breath. 1 Inhaler 0  . carvedilol (COREG) 3.125 MG tablet Take 2 tablets (6.25 mg total) by mouth 2 (two) times daily with a meal. 180 tablet 2  . furosemide (LASIX) 20 MG tablet Take 1 tablet (20 mg total) by mouth daily. 90 tablet 2  . guaiFENesin (MUCINEX) 600 MG 12 hr  tablet Take 600 mg by mouth 2 (two) times daily. CONGESTION    . lisinopril (PRINIVIL,ZESTRIL) 5 MG tablet Take 1 tablet (5 mg total) by mouth daily. 90 tablet 2   No current facility-administered medications for this visit.    Allergies:   Review of patient's allergies indicates no known allergies.   Social History:  The patient  reports that he quit smoking about 3 weeks ago. He does not have any smokeless tobacco history on file. He reports that he drinks alcohol. He reports that he does not use illicit drugs.   Family History:  The patient's family history includes Asthma in his  mother; Heart attack in his brother; Hypertension in his brother and mother. There is no history of Stroke.  ROS:  Please see the history of present illness.    All other systems are reviewed and otherwise negative.   PHYSICAL EXAM:  VS:  BP 120/100 mmHg  Pulse 96  Ht  (1.753 m)  Wt 124 lb 6.4 oz (56.427 kg)  BMI 18.36 kg/m2 BMI: Body mass index is 18.36 kg/(m^2). 140/92 Well nourished, well developed AAM, in no acute distress HEENT: normocephalic, atraumatic Neck: no JVD, carotid bruits or masses Cardiac:  normal S1, S2; RRR; soft SEM at LSB, no rubs or gallops Lungs:  clear to auscultation bilaterally, no wheezing, rhonchi or rales Abd: soft, nontender, no hepatomegaly, + BS MS: no deformity or atrophy Ext: no edema Skin: warm and dry, no rash Neuro:  moves all extremities spontaneously, no focal abnormalities noted, follows commands Psych: euthymic mood, full affect  Recent Labs: 02/21/2015: ALT 34; TSH 0.929 02/22/2015: Magnesium 1.7 03/23/2015: B Natriuretic Peptide 1765.9*; BUN 13; Creatinine, Ser 1.00; Hemoglobin 13.6; Platelets 159; Potassium 3.8; Sodium 138  02/22/2015: Cholesterol 92; HDL 27*; LDL Cholesterol 48; Total CHOL/HDL Ratio 3.4; Triglycerides 87; VLDL 17   CrCl cannot be calculated (Patient has no serum creatinine result on file.).   Wt Readings from Last 3 Encounters:  04/15/15 124 lb 6.4 oz (56.427 kg)  04/01/15 131 lb 14.4 oz (59.829 kg)  03/24/15 128 lb 12.8 oz (58.423 kg)     Other studies reviewed: Additional studies/records reviewed today include: summarized above  ASSESSMENT AND PLAN:  1. Chronic combined CHF/NICM - appears euvolemic on exam. Unfortunately he did not understand the directions for his prior carvedilol increase and did not make the change as previously suggested. Given elevated HR and recheck BP 140/92, I think he would still tolerate making a further titration to 12.5mg  BID. Would continue lisinopril and Lasix at present doses  for now. He does seem motivated to take his medications regularly and participate in his own healthcare now. He has the ability to check his BP at home and from this point on, I have asked him to please call if his BP is <100 (which would require down-titration of medications) versus >120 (which would mean we can further titrate his CHF meds). See below re: EtOH. I would recommend f/u in 2 months with his primary cardiologist at which time he may benefit from repeat echocardiogram to assess candidacy for ICD, as long as he has demonstrated compliance with medication and lifestyle recommendations. 2. Essential HTN - see above regarding medication change. 3. Alcohol abuse/tobacco abuse -  we had a very frank discussion about his alcohol intake and I told him that cessation is a critical factor to see improvement in his heart function. He does not seem interested in referral to rehab at this time. I  did ask him to see his primary care doctor to discuss safe strategies for cutting down to avoid precipitating withdrawal and failure. I congratulated him on the great accomplishment of quitting smoking. 4. Leukopenia - I encouraged him to f/u with his PCP to further evaluate this.   Disposition: F/u with Dr. Allyson Sabal in 2 months.  Current medicines are reviewed at length with the patient today.  The patient did not have any concerns regarding medicines.  Thomasene Mohair PA-C 04/15/2015 3:38 PM     [facility] [address] [place] [address] [phone] (office)  [phone] (fax)

## 2015-06-01 ENCOUNTER — Telehealth: Payer: Self-pay | Admitting: Internal Medicine

## 2015-06-01 NOTE — Telephone Encounter (Signed)
APPT. REMINDER CALL, LMTCB °

## 2015-06-02 ENCOUNTER — Encounter: Payer: Self-pay | Admitting: Internal Medicine

## 2015-06-17 ENCOUNTER — Ambulatory Visit: Payer: Self-pay | Admitting: Cardiovascular Disease

## 2015-06-20 ENCOUNTER — Encounter: Payer: Self-pay | Admitting: *Deleted

## 2016-04-04 ENCOUNTER — Other Ambulatory Visit: Payer: Self-pay | Admitting: Cardiology

## 2016-04-04 DIAGNOSIS — I5021 Acute systolic (congestive) heart failure: Secondary | ICD-10-CM

## 2016-04-04 DIAGNOSIS — I428 Other cardiomyopathies: Secondary | ICD-10-CM

## 2016-04-04 DIAGNOSIS — I10 Essential (primary) hypertension: Secondary | ICD-10-CM

## 2016-04-06 ENCOUNTER — Other Ambulatory Visit: Payer: Self-pay

## 2016-04-06 DIAGNOSIS — I10 Essential (primary) hypertension: Secondary | ICD-10-CM

## 2016-04-06 DIAGNOSIS — I428 Other cardiomyopathies: Secondary | ICD-10-CM

## 2016-04-06 DIAGNOSIS — I5021 Acute systolic (congestive) heart failure: Secondary | ICD-10-CM

## 2016-04-06 MED ORDER — FUROSEMIDE 20 MG PO TABS: 20.0000 mg | ORAL_TABLET | Freq: Every day | ORAL | 0 refills | Status: DC

## 2016-08-23 ENCOUNTER — Other Ambulatory Visit: Payer: Self-pay

## 2016-08-23 ENCOUNTER — Other Ambulatory Visit: Payer: Self-pay | Admitting: *Deleted

## 2016-08-23 DIAGNOSIS — I428 Other cardiomyopathies: Secondary | ICD-10-CM

## 2016-08-23 DIAGNOSIS — I10 Essential (primary) hypertension: Secondary | ICD-10-CM

## 2016-08-23 DIAGNOSIS — I5021 Acute systolic (congestive) heart failure: Secondary | ICD-10-CM

## 2016-08-23 MED ORDER — FUROSEMIDE 20 MG PO TABS: 20.0000 mg | ORAL_TABLET | Freq: Every day | ORAL | 0 refills | Status: DC

## 2016-08-23 MED ORDER — CARVEDILOL 12.5 MG PO TABS: 12.5000 mg | ORAL_TABLET | Freq: Two times a day (BID) | ORAL | 0 refills | Status: DC

## 2016-09-28 ENCOUNTER — Other Ambulatory Visit: Payer: Self-pay | Admitting: Physician Assistant

## 2016-09-28 ENCOUNTER — Other Ambulatory Visit: Payer: Self-pay | Admitting: Cardiovascular Disease

## 2016-09-28 ENCOUNTER — Other Ambulatory Visit: Payer: Self-pay | Admitting: Cardiology

## 2016-09-28 DIAGNOSIS — I5021 Acute systolic (congestive) heart failure: Secondary | ICD-10-CM

## 2016-09-28 DIAGNOSIS — I428 Other cardiomyopathies: Secondary | ICD-10-CM

## 2016-09-28 DIAGNOSIS — I10 Essential (primary) hypertension: Secondary | ICD-10-CM

## 2016-10-24 ENCOUNTER — Other Ambulatory Visit: Payer: Self-pay | Admitting: Physician Assistant

## 2016-10-24 DIAGNOSIS — I428 Other cardiomyopathies: Secondary | ICD-10-CM

## 2016-10-24 DIAGNOSIS — I5021 Acute systolic (congestive) heart failure: Secondary | ICD-10-CM

## 2016-10-24 DIAGNOSIS — I10 Essential (primary) hypertension: Secondary | ICD-10-CM

## 2016-11-08 ENCOUNTER — Encounter: Payer: Self-pay | Admitting: Internal Medicine

## 2016-11-29 ENCOUNTER — Encounter: Payer: Self-pay | Admitting: Internal Medicine

## 2017-02-25 ENCOUNTER — Other Ambulatory Visit: Payer: Self-pay | Admitting: Physician Assistant

## 2017-02-25 DIAGNOSIS — I10 Essential (primary) hypertension: Secondary | ICD-10-CM

## 2017-02-25 DIAGNOSIS — I428 Other cardiomyopathies: Secondary | ICD-10-CM

## 2017-02-25 DIAGNOSIS — I5021 Acute systolic (congestive) heart failure: Secondary | ICD-10-CM

## 2017-04-05 ENCOUNTER — Other Ambulatory Visit: Payer: Self-pay | Admitting: Cardiovascular Disease

## 2017-04-05 DIAGNOSIS — I428 Other cardiomyopathies: Secondary | ICD-10-CM

## 2017-04-05 DIAGNOSIS — I10 Essential (primary) hypertension: Secondary | ICD-10-CM

## 2017-04-05 DIAGNOSIS — I5021 Acute systolic (congestive) heart failure: Secondary | ICD-10-CM

## 2017-04-05 NOTE — Telephone Encounter (Signed)
REFILL 

## 2018-02-11 ENCOUNTER — Other Ambulatory Visit: Payer: Self-pay | Admitting: Internal Medicine

## 2018-02-11 ENCOUNTER — Other Ambulatory Visit: Payer: Self-pay

## 2018-02-11 ENCOUNTER — Ambulatory Visit (INDEPENDENT_AMBULATORY_CARE_PROVIDER_SITE_OTHER): Payer: Self-pay | Admitting: Internal Medicine

## 2018-02-11 ENCOUNTER — Telehealth: Payer: Self-pay

## 2018-02-11 VITALS — BP 144/101 | HR 87 | Temp 98.0°F | Ht 69.0 in | Wt 134.7 lb

## 2018-02-11 DIAGNOSIS — R011 Cardiac murmur, unspecified: Secondary | ICD-10-CM

## 2018-02-11 DIAGNOSIS — R0602 Shortness of breath: Secondary | ICD-10-CM | POA: Insufficient documentation

## 2018-02-11 DIAGNOSIS — I5042 Chronic combined systolic (congestive) and diastolic (congestive) heart failure: Secondary | ICD-10-CM

## 2018-02-11 DIAGNOSIS — I428 Other cardiomyopathies: Secondary | ICD-10-CM

## 2018-02-11 DIAGNOSIS — Z79899 Other long term (current) drug therapy: Secondary | ICD-10-CM

## 2018-02-11 LAB — BASIC METABOLIC PANEL
Anion gap: 8 (ref 5–15)
BUN: 18 mg/dL (ref 8–23)
CALCIUM: 8.9 mg/dL (ref 8.9–10.3)
CO2: 24 mmol/L (ref 22–32)
Chloride: 106 mmol/L (ref 98–111)
Creatinine, Ser: 1.08 mg/dL (ref 0.61–1.24)
GFR calc non Af Amer: >60 mL/min (ref >60)
Glucose, Bld: 74 mg/dL (ref 70–99)
Potassium: 4.4 mmol/L (ref 3.5–5.1)
Sodium: 138 mmol/L (ref 135–145)

## 2018-02-11 LAB — BRAIN NATRIURETIC PEPTIDE: B Natriuretic Peptide: 1768 pg/mL — ABNORMAL HIGH (ref 0.0–100.0)

## 2018-02-11 MED ORDER — FUROSEMIDE 20 MG PO TABS: 20.0000 mg | ORAL_TABLET | Freq: Every day | ORAL | 0 refills | Status: DC

## 2018-02-11 MED ORDER — LISINOPRIL 5 MG PO TABS: 5.0000 mg | ORAL_TABLET | Freq: Every day | ORAL | 1 refills | Status: DC

## 2018-02-11 NOTE — Progress Notes (Signed)
   CC: shortness of breath  HPI:  [name] is a 62 y.o. male with history noted below that presents to the acute care clinic to re-establish care and obtain refills on his heart failure medications.  Past Medical History:  Diagnosis Date  . Alcohol abuse   . Chronic combined systolic and diastolic CHF (congestive heart failure) (HCC)    a. 2D Echo 02/22/15 showed EF 20-25%, multiple WMA, grade 2 DD, aortic sclerosis, mild MR, severely dilated LA, cannot exclude PFO.  . Essential hypertension   . Leukopenia    a. Noted on labs, unclear what prior w/u.  Marland Kitchen NICM (nonischemic cardiomyopathy) (HCC)    a. LHC 1/22016: normal cors.  . Pulmonary hypertension    a. moderate pulm HTN by cath 02/2015.  . Tobacco abuse     Review of Systems:  Review of Systems  Respiratory: Positive for shortness of breath. Negative for cough and wheezing.   Cardiovascular: Negative for chest pain, orthopnea and leg swelling.     Physical Exam:  Vitals:   02/11/18 0951  BP: (!) 144/101  Pulse: 87  Temp: 98 F (36.7 C)  TempSrc: Oral  SpO2: 100%  Weight: 134 lb 11.2 oz (61.1 kg)  Height: 5\' 9"  (1.753 m)   Physical Exam  Constitutional: He is well-developed, well-nourished, and in no distress.  Cardiovascular: Normal rate and regular rhythm. Exam reveals no gallop and no friction rub.  Murmur heard. Pulmonary/Chest: Effort normal and breath sounds normal. No respiratory distress. He has no wheezes. He has no rales.  Musculoskeletal: He exhibits no edema.  Skin: Skin is warm and dry.     Assessment & Plan:   See encounters tab for problem based medical decision making.   Patient discussed with Dr. Rogelia Boga

## 2018-02-11 NOTE — Patient Instructions (Signed)
Darin Brady,  It was a pleasure meeting you today. Please start taking lisinopril 5 mg daily and Lasix 20 mg daily. Please follow up in 1 week.

## 2018-02-11 NOTE — Telephone Encounter (Signed)
Pt states albuterol inhaler cost $70.00, unable to afford this. Please call pt back.

## 2018-02-11 NOTE — Assessment & Plan Note (Addendum)
History of present illness: For the past 2 weeks patient has experienced dyspnea on exertion.  He states he's been taking lisinopril 5 mg daily but has been out of carvedilol and furosemide for the past year.  He reports difficulty walking short distances without feeling short of breath. Patient denies any chest pain, orthopnea or leg swelling.  Assessment: dyspnea on exertion Patient has systolic heart failure 20-25 percent on echo in 2017. Symptoms likely from a mild CHF exacerbation.  Lungs were clear to auscultation and no lower extremity swelling noted.  Pulse ox was 97% on room air at rest and with ambulation.  Will restart Lasix and continue lisinopril. Will have patient do daily weights and return in 1 week to reassess symptoms. At that time patient can be restarted on carvedilol pending clinical assessment.     Plan -Lisinopril 5 mg daily -Furosemide 20 mg daily -Bmet and BNP

## 2018-02-11 NOTE — Progress Notes (Signed)
Internal Medicine Clinic Attending  Case discussed with Dr. Mikey Bussing at the time of the visit.  We reviewed the resident's history and exam and pertinent patient test results.  I agree with the assessment, diagnosis, and plan of care documented in the resident's note.  Mr. Lundwall has a nonischemic cardiomyopathy per his catheterization at the time of his heart failure diagnosis.  His EF at the catheterization was only 15%.  He has subjective symptoms of volume overload but no objective findings per Dr. Mikey Bussing.  I agree with continuing his lisinopril and resuming his Lasix.  We will not start his beta-blocker at this time due to its negative inotropic effects and the possibility that he is volume overloaded.  He will have a quick follow-up to reassess his symptoms, exam, BMP, and further escalate his medications.

## 2018-02-12 ENCOUNTER — Other Ambulatory Visit: Payer: Self-pay | Admitting: Internal Medicine

## 2018-02-12 MED ORDER — ALBUTEROL SULFATE HFA 108 (90 BASE) MCG/ACT IN AERS: 1.0000 | INHALATION_SPRAY | Freq: Four times a day (QID) | RESPIRATORY_TRACT | 1 refills | Status: DC | PRN

## 2018-02-12 NOTE — Telephone Encounter (Signed)
Patient does not have insurance.  Not sure what the cheapest option would be for generic albuterol inhaler.  Will also route to Dr. Selena Batten for advice

## 2018-02-26 ENCOUNTER — Ambulatory Visit: Payer: Self-pay | Admitting: Cardiovascular Disease

## 2018-02-26 ENCOUNTER — Encounter: Payer: Self-pay | Admitting: Internal Medicine

## 2018-02-26 ENCOUNTER — Ambulatory Visit (INDEPENDENT_AMBULATORY_CARE_PROVIDER_SITE_OTHER): Payer: Self-pay | Admitting: Internal Medicine

## 2018-02-26 ENCOUNTER — Ambulatory Visit: Payer: Self-pay

## 2018-02-26 VITALS — BP 136/103 | HR 93 | Temp 98.2°F | Wt 132.9 lb

## 2018-02-26 DIAGNOSIS — I428 Other cardiomyopathies: Secondary | ICD-10-CM

## 2018-02-26 DIAGNOSIS — Z79899 Other long term (current) drug therapy: Secondary | ICD-10-CM

## 2018-02-26 DIAGNOSIS — I340 Nonrheumatic mitral (valve) insufficiency: Secondary | ICD-10-CM

## 2018-02-26 DIAGNOSIS — I5032 Chronic diastolic (congestive) heart failure: Secondary | ICD-10-CM

## 2018-02-26 DIAGNOSIS — I5022 Chronic systolic (congestive) heart failure: Secondary | ICD-10-CM

## 2018-02-26 NOTE — Progress Notes (Signed)
   CC: F/U HFrEF  HPI:  [name] is a 62 y.o. M with PMHx listed below presenting for exertional dyspnea. Please see the A&P for the status of the patient's chronic medical problems.  Past Medical History:  Diagnosis Date  . Alcohol abuse   . Chronic combined systolic and diastolic CHF (congestive heart failure) (HCC)    a. 2D Echo 02/22/15 showed EF 20-25%, multiple WMA, grade 2 DD, aortic sclerosis, mild MR, severely dilated LA, cannot exclude PFO.  . Essential hypertension   . Leukopenia    a. Noted on labs, unclear what prior w/u.  Marland Kitchen NICM (nonischemic cardiomyopathy) (HCC)    a. LHC 1/22016: normal cors.  . Pulmonary hypertension (HCC)    a. moderate pulm HTN by cath 02/2015.  . Tobacco abuse    Review of Systems:  Performed and all others negative.  Physical Exam: Vitals:   02/26/18 1014  BP: (!) 136/103  Pulse: 93  Temp: 98.2 F (36.8 C)  TempSrc: Oral  SpO2: 100%  Weight: 132 lb 14.4 oz (60.3 kg)   General: Thin elderly male in no acute distress Pulm: Good air movement with no wheezing or crackles  CV: RRR, ?diastolic murmur, PMI laterally displaced, +hepatojugular reflex Abdomen: Active bowel sounds, soft, non-distended, no tenderness to palpation  Extremities: Pulses palpable in all extremities, no LE edema  Skin: Warm and dry  Neuro: Alert and oriented x 3  Assessment & Plan:   See Encounters Tab for problem based charting.  Patient discussed with Dr. Cleda Daub

## 2018-02-26 NOTE — Assessment & Plan Note (Addendum)
HPI:  Patient presented for continued evaluation and management of her HFrEF. Per chart review the patient was diagnosed with nonischemic cardiomyopathy in 02/2015 when he was hospitalized for an acute heart failure exacerbation. Echocardiogram at the time illustrated an LVEF 20-25%, G2DD, mild mitral regurgitation, severe left atrial enlargement and a trivial pericardial effusion. This is followed up with a right/left heart catheterization that illustrated normal coronary artery disease and left ventricular ejection fraction of 15%. He followed up with cardiology in early 2017; however, since then has not been back to see any doctor.   Today he states that his shortness of breath is really starting to affect his quality of life. He states that he is unable to do even minor activities without becoming extremely short of breath. He is unable to walk within 50 yards without needing to stop to catch his breath. He endorses orthopnea. He continues to drink for 16 ounce beers per day but otherwise states that he does not taking excessive fluids. He does eat fast food daily.  On physical exam he has a positive about a jugular reflex, a laterally displaced PMI, and a questionable diastolic murmur. He otherwise is warm to the touch and does not have significant lower extremity edema.  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 I have called over to Dr. Hazle Coca office and got the patient an appointment for today. He will need medication optimization. He is currently only on furosemide 20 mg daily and lisinopril 5 mg daily. He is uninsured so the price of medication will need to be considered while optimizing his medications. He will need to be started on a beta blocker and his lisinopril will need to be increased. Given his degree of heart failure and his symptoms he would  also benefit from spironolactone and hydralazine/isosorbide mononitrate. He will need repeat echocardiogram before and after getting him on goal directed therapy to determine the need for ICD placement. He will also need an EKG to assess for a wide QRS and the need for CRT.  Alcohol cessation is also extremely important given the cause of his heart failure is likely secondary to excessive alcohol use.

## 2018-02-26 NOTE — Progress Notes (Signed)
Internal Medicine Clinic Attending  Case discussed with Dr. Helberg at the time of the visit.  We reviewed the resident's history and exam and pertinent patient test results.  I agree with the assessment, diagnosis, and plan of care documented in the resident's note.    

## 2018-02-26 NOTE — Patient Instructions (Addendum)
Thank you for allowing [place] to provide your care. Your shortness of breath is likely related to your heart. It is important that we get you on all the right medications and get you plugged in with cardiology. I have called Dr. Hazle Coca office (address below) and you are scheduled for an appointment at 1:30pm today. It is very important for your health that you keep this appointment. We would like to see you back fairly quickly.  If over the holiday season you begin to experience worsening shortness of breath, chest pain, lower extremity edema please seek further medical attention immediately.  Address: [address] [place], [place], [place] [address]

## 2018-02-27 NOTE — Telephone Encounter (Signed)
Pt needs to be on IM Program and apply for orange card, spoke to [facility] and will contact w/ pt at first of year

## 2018-03-11 ENCOUNTER — Other Ambulatory Visit: Payer: Self-pay | Admitting: Internal Medicine

## 2018-03-14 ENCOUNTER — Encounter: Payer: Self-pay | Admitting: Cardiovascular Disease

## 2018-03-14 ENCOUNTER — Ambulatory Visit (INDEPENDENT_AMBULATORY_CARE_PROVIDER_SITE_OTHER): Payer: Self-pay | Admitting: Cardiovascular Disease

## 2018-03-14 VITALS — BP 130/90 | HR 82 | Ht 69.0 in | Wt 134.4 lb

## 2018-03-14 DIAGNOSIS — I272 Pulmonary hypertension, unspecified: Secondary | ICD-10-CM

## 2018-03-14 DIAGNOSIS — I428 Other cardiomyopathies: Secondary | ICD-10-CM

## 2018-03-14 DIAGNOSIS — Z720 Tobacco use: Secondary | ICD-10-CM

## 2018-03-14 DIAGNOSIS — Z79899 Other long term (current) drug therapy: Secondary | ICD-10-CM

## 2018-03-14 DIAGNOSIS — R55 Syncope and collapse: Secondary | ICD-10-CM

## 2018-03-14 DIAGNOSIS — F1010 Alcohol abuse, uncomplicated: Secondary | ICD-10-CM

## 2018-03-14 DIAGNOSIS — I10 Essential (primary) hypertension: Secondary | ICD-10-CM

## 2018-03-14 MED ORDER — CARVEDILOL 3.125 MG PO TABS: 3.1250 mg | ORAL_TABLET | Freq: Two times a day (BID) | ORAL | 3 refills | Status: DC

## 2018-03-14 NOTE — Assessment & Plan Note (Signed)
Still drinking 3-4 beers a day.

## 2018-03-14 NOTE — Assessment & Plan Note (Signed)
History of essential hypertension currently not on any medications.  I am going to start carvedilol 3.25 mg p.o. twice daily and titrate up to 25 mg p.o. twice daily.

## 2018-03-14 NOTE — Patient Instructions (Signed)
Medication Instructions:  START CARVEDILOL 3.125 MG, 1 TABLET BY MOUTH TWICE A DAY  TAKE YOUR LISINOPRIL 5 MG TABLET, 1 TABLET BY MOUTH ONCE A DAY  If you need a refill on your cardiac medications before your next appointment, please call your pharmacy.   Lab work: Your physician recommends that you return for lab work in: 7-10 DAYS---BMP; LIPID; LIVER  If you have labs (blood work) drawn today and your tests are completely normal, you will receive your results only by: Marland Kitchen MyChart Message (if you have MyChart) OR . A paper copy in the mail If you have any lab test that is abnormal or we need to change your treatment, we will call you to review the results.  Testing/Procedures: Your physician has requested that you have an echocardiogram. Echocardiography is a painless test that uses sound waves to create images of your heart. It provides your doctor with information about the size and shape of your heart and how well your heart's chambers and valves are working. This procedure takes approximately one hour. There are no restrictions for this procedure.    Follow-Up: At [facility], you and your health needs are our priority.  As part of our continuing mission to provide you with exceptional heart care, we have created designated Provider Care Teams.  These Care Teams include your primary Cardiologist (physician) and Advanced Practice Providers (APPs -  Physician Assistants and Nurse Practitioners) who all work together to provide you with the care you need, when you need it. You will need a follow up appointment in 6 months.  Please call our office 2 months in advance to schedule this appointment.  You may see DR. BERRY or one of the following Advanced Practice Providers on your designated Care Team:   Corine Shelter, PA-C [place], [place] . Marjie Skiff, PA-C  Any Other Special Instructions Will Be Listed Below (If Applicable). FOLLOW UP WITH KRISTIN ALVSTAD, RPH IN 2  WEEKS  REFERRAL TO CHF CLINIC

## 2018-03-14 NOTE — Assessment & Plan Note (Signed)
Darin Brady had a right left heart cath performed by Dr. [place] 02/23/2015 revealing normal coronary arteries with an EF of 50% pulmonary hypertension.  Was on medications for this but has not taken any heart meds for the last 3 years.

## 2018-03-14 NOTE — Progress Notes (Signed)
03/14/2018 Darin Brady   [birth year]  [id number]  Primary Physician Darin Lowe, MD Primary Cardiologist: Runell Gess MD Darin Brady, Darin Brady  HPI:  Darin Brady is a 62 y.o.  thin appearing single African American male with no children referred for evaluation of syncope.  I last saw him in the office 07/08/2013.  He has not worked for 8 years. His risk factors include 40 pack years of tobacco abuse currently smoking one pack per day as well as 2 hypertension. He is a 70 year old brother died of a massive myocardial infarction. He drinks 2-3 beers a day 4 days a week. He's had episodes of syncope, one in March and one in April. There were sudden onset without seizure activity.  He had right and left heart cath performed by Dr. Swaziland 02/24/2015 revealing normal coronary arteries, EF of 50% with moderate pulmonary hypertension.  He continues to smoke 2 cigarettes a day and drinks 3-4 beers a day.  He does have most likely class III symptoms was severe shortness of breath on exertion.  Has not taken any cardiac meds for the last 3 years.  He denies chest pain.   Current Meds  Medication Sig  . furosemide (LASIX) 20 MG tablet Take 1 tablet (20 mg total) by mouth daily. NEED OV.  Marland Kitchen lisinopril (PRINIVIL,ZESTRIL) 5 MG tablet Take 1 tablet (5 mg total) by mouth daily.     No Known Allergies  Social History   Socioeconomic History  . Marital status: Single    Spouse name: Not on file  . Number of children: Not on file  . Years of education: Not on file  . Highest education level: Not on file  Occupational History  . Occupation: Insurance risk surveyor  Social Needs  . Financial resource strain: Not on file  . Food insecurity:    Worry: Not on file    Inability: Not on file  . Transportation needs:    Medical: Not on file    Non-medical: Not on file  Tobacco Use  . Smoking status: Current Every Day Smoker    Packs/day: 0.20    Last attempt to quit: 03/24/2015    Years  since quitting: 2.9  . Smokeless tobacco: Never Used  . Tobacco comment: 3-4 CIAGARETTES A DAY  Substance and Sexual Activity  . Alcohol use: Yes    Alcohol/week: 0.0 standard drinks    Comment: 2-3 sixteen ounce beers daily.  . Drug use: No  . Sexual activity: Yes  Lifestyle  . Physical activity:    Days per week: Not on file    Minutes per session: Not on file  . Stress: Not on file  Relationships  . Social connections:    Talks on phone: Not on file    Gets together: Not on file    Attends religious service: Not on file    Active member of club or organization: Not on file    Attends meetings of clubs or organizations: Not on file    Relationship status: Not on file  . Intimate partner violence:    Fear of current or ex partner: Not on file    Emotionally abused: Not on file    Physically abused: Not on file    Forced sexual activity: Not on file  Other Topics Concern  . Not on file  Social History Narrative  . Not on file     Review of Systems: General: negative for chills, fever, night sweats or  weight changes.  Cardiovascular: negative for chest pain, dyspnea on exertion, edema, orthopnea, palpitations, paroxysmal nocturnal dyspnea or shortness of breath Dermatological: negative for rash Respiratory: negative for cough or wheezing Urologic: negative for hematuria Abdominal: negative for nausea, vomiting, diarrhea, bright red blood per rectum, melena, or hematemesis Neurologic: negative for visual changes, syncope, or dizziness All other systems reviewed and are otherwise negative except as noted above.    Blood pressure 130/90, pulse 82, height 5\' 9"  (1.753 m), weight 134 lb 6.4 oz (61 kg).  General appearance: alert and no distress Neck: no adenopathy, no carotid bruit, no JVD, supple, symmetrical, trachea midline and thyroid not enlarged, symmetric, no tenderness/mass/nodules Lungs: clear to auscultation bilaterally Heart: regular rate and rhythm, S1, S2  normal, no murmur, click, rub or gallop Extremities: extremities normal, atraumatic, no cyanosis or edema Pulses: 2+ and symmetric Skin: Skin color, texture, turgor normal. No rashes or lesions Neurologic: Alert and oriented X 3, normal strength and tone. Normal symmetric reflexes. Normal coordination and gait  EKG sinus rhythm 82 with left axis deviation, nonspecific ST and T wave changes with QRS widening and poor R wave progression.  I personally reviewed this EKG.  ASSESSMENT AND PLAN:   Essential hypertension History of essential hypertension currently not on any medications.  I am going to start carvedilol 3.25 mg p.o. twice daily and titrate up to 25 mg p.o. twice daily.  Alcohol abuse Still drinking 3-4 beers a day.  Nonischemic cardiomyopathy [facility]) Darin Brady had a right left heart cath performed by Dr. Swaziland 02/23/2015 revealing normal coronary arteries with an EF of 50% pulmonary hypertension.  Was on medications for this but has not taken any heart meds for the last 3 years.  Tobacco abuse Ongoing tobacco abuse of 2 cigarettes a day significantly improved from his smoking in the past.      Runell Gess MD [facility], [facility] 03/14/2018 10:56 AM

## 2018-03-14 NOTE — Assessment & Plan Note (Signed)
Ongoing tobacco abuse of 2 cigarettes a day significantly improved from his smoking in the past.

## 2018-03-21 ENCOUNTER — Ambulatory Visit (HOSPITAL_COMMUNITY): Payer: Medicaid Other | Attending: Cardiovascular Disease

## 2018-03-21 ENCOUNTER — Other Ambulatory Visit: Payer: Self-pay

## 2018-03-21 DIAGNOSIS — R55 Syncope and collapse: Secondary | ICD-10-CM | POA: Diagnosis present

## 2018-03-21 MED ORDER — PERFLUTREN LIPID MICROSPHERE
1.0000 mL | INTRAVENOUS | Status: AC | PRN
Start: 2018-03-21 — End: 2018-03-21
  Administered 2018-03-21: 2 mL via INTRAVENOUS

## 2018-03-24 ENCOUNTER — Telehealth: Payer: Self-pay

## 2018-03-24 LAB — HEPATIC FUNCTION PANEL
ALK PHOS: 75 IU/L (ref 39–117)
ALT: 25 IU/L (ref 0–44)
AST: 28 IU/L (ref 0–40)
Albumin: 4.2 g/dL (ref 3.6–4.8)
BILIRUBIN, DIRECT: 0.36 mg/dL (ref 0.00–0.40)
Bilirubin Total: 1.2 mg/dL (ref 0.0–1.2)
Total Protein: 7.2 g/dL (ref 6.0–8.5)

## 2018-03-24 LAB — LIPID PANEL
Chol/HDL Ratio: 2.7 ratio (ref 0.0–5.0)
Cholesterol, Total: 103 mg/dL (ref 100–199)
HDL: 38 mg/dL — AB (ref >39)
LDL Calculated: 50 mg/dL (ref 0–99)
TRIGLYCERIDES: 77 mg/dL (ref 0–149)
VLDL CHOLESTEROL CAL: 15 mg/dL (ref 5–40)

## 2018-03-24 LAB — BASIC METABOLIC PANEL
BUN / CREAT RATIO: 14 (ref 10–24)
BUN: 15 mg/dL (ref 8–27)
CHLORIDE: 104 mmol/L (ref 96–106)
CO2: 19 mmol/L — ABNORMAL LOW (ref 20–29)
Calcium: 9.2 mg/dL (ref 8.6–10.2)
Creatinine, Ser: 1.07 mg/dL (ref 0.76–1.27)
GFR, EST AFRICAN AMERICAN: 84 mL/min/{1.73_m2} (ref >59)
GFR, EST NON AFRICAN AMERICAN: 73 mL/min/{1.73_m2} (ref >59)
GLUCOSE: 110 mg/dL — AB (ref 65–99)
POTASSIUM: 4.2 mmol/L (ref 3.5–5.2)
Sodium: 139 mmol/L (ref 134–144)

## 2018-03-24 NOTE — Telephone Encounter (Signed)
Spoke with Darin Brady regarding result note. Darin Brady advised to schedule ROV to discuss results of recent echo. Clarified confusion regarding upcoming OV with Phillips Hay, RPh on 1/16. Darin Brady scheduled for ROV with Allyson Sabal, MD on 1/29. Darin Brady verbalized understanding and agreeable with this

## 2018-03-25 ENCOUNTER — Encounter: Payer: Self-pay | Admitting: *Deleted

## 2018-03-27 ENCOUNTER — Telehealth: Payer: Self-pay | Admitting: Cardiovascular Disease

## 2018-03-27 ENCOUNTER — Ambulatory Visit (INDEPENDENT_AMBULATORY_CARE_PROVIDER_SITE_OTHER): Payer: Self-pay | Admitting: Pharmacist

## 2018-03-27 VITALS — BP 138/90 | HR 70 | Ht 69.0 in | Wt 137.2 lb

## 2018-03-27 DIAGNOSIS — I10 Essential (primary) hypertension: Secondary | ICD-10-CM

## 2018-03-27 DIAGNOSIS — I428 Other cardiomyopathies: Secondary | ICD-10-CM

## 2018-03-27 DIAGNOSIS — Z79899 Other long term (current) drug therapy: Secondary | ICD-10-CM

## 2018-03-27 MED ORDER — FUROSEMIDE 20 MG PO TABS: ORAL_TABLET | ORAL | 0 refills | Status: DC

## 2018-03-27 MED ORDER — CARVEDILOL 3.125 MG PO TABS: 6.2500 mg | ORAL_TABLET | Freq: Two times a day (BID) | ORAL | 3 refills | Status: DC

## 2018-03-27 MED ORDER — FUROSEMIDE 20 MG PO TABS: ORAL_TABLET | ORAL | 6 refills | Status: DC

## 2018-03-27 MED ORDER — LISINOPRIL 5 MG PO TABS: 5.0000 mg | ORAL_TABLET | Freq: Every day | ORAL | 1 refills | Status: DC

## 2018-03-27 NOTE — Telephone Encounter (Signed)
Refill sent to the pharmacy electronically.  

## 2018-03-27 NOTE — Progress Notes (Signed)
Patient ID: Darin Brady                 DOB: 1955/03/21                      MRN: [id number]     HPI: Darin Brady is a 63 y.o. male referred by Dr. Allyson Sabal to HTN clinic. PMH includes hypertension, alcohol abuse, cardiomyopathy with EF of 15-20% , tobacco abuse, and compliances issues.  Patient present today for blood pressure monitoring and medication titration. His chief complain if shortness of breath and inability to walk long distance without running out of air. Reports compliance with current therapy as prescribed by Dr Allyson Sabal  Current HTN meds:  Carvedilol 3.125mg  twice daily Furosemide 20mg  daily  Lisinopril 5mg  daily  BP goal: 130/80  Family History: The patient's family history includes Asthma in his mother; Heart attack in his brother; Hypertension in his brother and mother. There is no history of Stroke.  Social History: Former smoker. He does not have any smokeless tobacco history on file. He reports that he drinks alcohol. He reports that he does not use illicit drugs.  Diet: mainly home cooked meals, low sodium diet  Exercise: minimal due to shortness of breath  Home BP readings: none provided  Wt Readings from Last 3 Encounters:  03/27/18 137 lb 3.2 oz (62.2 kg)  03/14/18 134 lb 6.4 oz (61 kg)  02/26/18 132 lb 14.4 oz (60.3 kg)   BP Readings from Last 3 Encounters:  03/27/18 138/90  03/14/18 130/90  02/26/18 (!) 136/103   Pulse Readings from Last 3 Encounters:  03/27/18 70  03/14/18 82  02/26/18 93    Renal function: Estimated Creatinine Clearance: 61.4 mL/min (by C-G formula based on SCr of 1.07 mg/dL).  Past Medical History:  Diagnosis Date  . Acute otitis media 04/01/2015  . Alcohol abuse   . Chronic combined systolic and diastolic CHF (congestive heart failure) (HCC)    a. 2D Echo 02/22/15 showed EF 20-25%, multiple WMA, grade 2 DD, aortic sclerosis, mild MR, severely dilated LA, cannot exclude PFO.  Marland Kitchen Cough 04/01/2015  . Epigastric pain   .  Essential hypertension   . Leukopenia    a. Noted on labs, unclear what prior w/u.  Marland Kitchen NICM (nonischemic cardiomyopathy) (HCC)    a. LHC 1/22016: normal cors.  . Non-ischemic cardiomyopathy (HCC) 03/03/2015  . Pulmonary hypertension (HCC)    a. moderate pulm HTN by cath 02/2015.  Marland Kitchen Syncope 07/08/2013   syncope   . Tobacco abuse     No current outpatient medications on file prior to visit.   No current facility-administered medications on file prior to visit.     No Known Allergies  Blood pressure 138/90, pulse 70, height 5\' 9"  (1.753 m), weight 137 lb 3.2 oz (62.2 kg).  Nonischemic cardiomyopathy (HCC) Blood pressure slightly above goal and HR within normal levels. Appropriate to continue carvedilol uptitration. Long time spent talking to patient about disease state, s/sx of fluid overload, and importance of medication compliance.   Will double his furosemide today ONLY, increase carvedilol to 6.25mg  twice daily, continue lisinopril 5mg  daily, repeat BMET to assess renal function, and follow up in 2 weeks.    Moe Graca Rodriguez-Guzman PharmD, BCPS, CPP [facility] [address] [place] [address] 04/01/2018 5:11 PM

## 2018-03-27 NOTE — Telephone Encounter (Signed)
New Message    *STAT* If patient is at the pharmacy, call can be transferred to refill team.   1. Which medications need to be refilled? (please list name of each medication and dose if known) Furosemide  2. Which pharmacy/location (including street and city if local pharmacy) is medication to be sent to? [facility] [address]  3. Do they need a 30 day or 90 day supply? 90  Days

## 2018-03-27 NOTE — Telephone Encounter (Signed)
Spoke with pt who states he has two bottles of Carvedilol 3.125 mg with the same instruction. Pt advised the per OV today with Pharm D, he is to increase his dose to 2 tablets twice a day. Pt verbalized understanding. Pt also informed that Rx for Lasix has already been sent to the pharmacy.

## 2018-03-27 NOTE — Telephone Encounter (Signed)
New message   Pt c/o medication issue:  1. Name of Medication: Carvedilol 3.125mg , Furosemide 20mg   2. How are you currently taking this medication (dosage and times per day)? Carvedilol 2x daily  Furosemide 1x daily   3. Are you having a reaction (difficulty breathing--STAT)? No   4. What is your medication issue? Pt has 2 bottles of Carvedilol that was given at the pharmacy and he thinks he is suppose to take 4 tablets per day  He also said the Pharmacy did not fill Furosemide. (I did fill out a refill request)   Please call

## 2018-03-27 NOTE — Patient Instructions (Addendum)
Return for a  follow up appointment in 2 weeks with DR Allyson Sabal  Check your blood pressure at home daily (if able) and keep record of the readings.  Take your BP meds as follows: *TAKE AN EXTRA FUROSEMIDE 20MG  TODAY ONLY, THEN RESUME 20MG  EVERY MORNING* *INCREASE CARVEDILOL TO 6.25MG  TWICE DAILY(OKAY TO TAKE 2 TABLETS OF 3.125MG  TWICE DAILY UNTIL ALL GONE) *CONTINUE LISINOPRIL 5MG  DAILY*   CALL CLINIC FOR INSTRUCTION IF :  You have trouble breathing when you are active or you need to sleep with extra pillows.  You have swelling in your legs or abdomen.  You gain 2-3 lb (0.9-1.4 kg) in one day, or 5 lb (2.3 kg) in one week. This amount may be more or less depending on your condition.  You get tired easily.  You have trouble sleeping.  You have a dry cough.    Bring all of your meds, your BP cuff and your record of home blood pressures to your next appointment.  Exercise as you're able, try to walk approximately 30 minutes per day.  Keep salt intake to a minimum, especially watch canned and prepared boxed foods.  Eat more fresh fruits and vegetables and fewer canned items.  Avoid eating in fast food restaurants.    HOW TO TAKE YOUR BLOOD PRESSURE: . Rest 5 minutes before taking your blood pressure. .  Don't smoke or drink caffeinated beverages for at least 30 minutes before. . Take your blood pressure before (not after) you eat. . Sit comfortably with your back supported and both feet on the floor (don't cross your legs). . Elevate your arm to heart level on a table or a desk. . Use the proper sized cuff. It should fit smoothly and snugly around your bare upper arm. There should be enough room to slip a fingertip under the cuff. The bottom edge of the cuff should be 1 inch above the crease of the elbow. . Ideally, take 3 measurements at one sitting and record the average.

## 2018-04-01 ENCOUNTER — Encounter: Payer: Self-pay | Admitting: Pharmacist

## 2018-04-01 NOTE — Assessment & Plan Note (Signed)
Blood pressure slightly above goal and HR within normal levels. Appropriate to continue carvedilol uptitration. Long time spent talking to patient about disease state, s/sx of fluid overload, and importance of medication compliance.   Will double his furosemide today ONLY, increase carvedilol to 6.25mg  twice daily, continue lisinopril 5mg  daily, repeat BMET to assess renal function, and follow up in 2 weeks.

## 2018-04-03 ENCOUNTER — Other Ambulatory Visit: Payer: Self-pay

## 2018-04-03 ENCOUNTER — Encounter (HOSPITAL_COMMUNITY): Payer: Self-pay

## 2018-04-03 ENCOUNTER — Ambulatory Visit (HOSPITAL_COMMUNITY)
Admission: EM | Admit: 2018-04-03 | Discharge: 2018-04-03 | Disposition: A | Discharge status: Home / Self Care | Payer: Self-pay | Attending: Family Medicine | Admitting: Family Medicine

## 2018-04-03 DIAGNOSIS — J069 Acute upper respiratory infection, unspecified: Secondary | ICD-10-CM | POA: Insufficient documentation

## 2018-04-03 DIAGNOSIS — B9789 Other viral agents as the cause of diseases classified elsewhere: Secondary | ICD-10-CM | POA: Insufficient documentation

## 2018-04-03 MED ORDER — GUAIFENESIN ER 600 MG PO TB12: 600.0000 mg | ORAL_TABLET | Freq: Two times a day (BID) | ORAL | 0 refills | Status: DC

## 2018-04-03 MED ORDER — AMOXICILLIN-POT CLAVULANATE 875-125 MG PO TABS: 1.0000 | ORAL_TABLET | Freq: Two times a day (BID) | ORAL | 0 refills | Status: DC

## 2018-04-03 MED ORDER — BENZONATATE 100 MG PO CAPS: 100.0000 mg | ORAL_CAPSULE | Freq: Three times a day (TID) | ORAL | 0 refills | Status: DC

## 2018-04-03 MED ORDER — ALBUTEROL SULFATE HFA 108 (90 BASE) MCG/ACT IN AERS
INHALATION_SPRAY | RESPIRATORY_TRACT | Status: AC
  Filled 2018-04-03: qty 6.7

## 2018-04-03 NOTE — Discharge Instructions (Addendum)
I believe that this is viral upper respiratory infection We will give you an albuterol inhaler for cough, wheezing, SOB Mucinex for cough and congestion Tessalon pearls for worsening cough and to help at night time to sleep.  If your not feeling better in the next few days then go ahead and start the antibiotic.  Follow up as needed for continued or worsening symptoms

## 2018-04-03 NOTE — ED Triage Notes (Signed)
Pt cc he has been coughing x 4 days. Pt he has been coughing up thick mucus.

## 2018-04-04 NOTE — ED Provider Notes (Signed)
[facility]    CSN: [id number] Arrival date & time: 04/03/18  8938     History   Chief Complaint Chief Complaint  Patient presents with  . Appointment  . Cough    HPI Darin Brady is a 63 y.o. male.   Pt is a 63 year old male that presents with 4 days of cough, productive and congestion. He has PMH of CHF, cardiomyopathy, pulmonary hypertension, hypertension. He does smoke. He denies any fever, chills, myalgias. No recent sick contact. No SOB, chest pain, palpitations. No LE edema. He has not been taking anything for the symptoms.   ROS per HPI      Past Medical History:  Diagnosis Date  . Acute otitis media 04/01/2015  . Alcohol abuse   . Chronic combined systolic and diastolic CHF (congestive heart failure) (HCC)    a. 2D Echo 02/22/15 showed EF 20-25%, multiple WMA, grade 2 DD, aortic sclerosis, mild MR, severely dilated LA, cannot exclude PFO.  Marland Kitchen Cough 04/01/2015  . Epigastric pain   . Essential hypertension   . Leukopenia    a. Noted on labs, unclear what prior w/u.  Marland Kitchen NICM (nonischemic cardiomyopathy) (HCC)    a. LHC 1/22016: normal cors.  . Non-ischemic cardiomyopathy (HCC) 03/03/2015  . Pulmonary hypertension (HCC)    a. moderate pulm HTN by cath 02/2015.  Marland Kitchen Syncope 07/08/2013   syncope   . Tobacco abuse     Patient Active Problem List   Diagnosis Date Noted  . Pulmonary hypertension, unspecified (HCC) 03/14/2018  . Shortness of breath 02/11/2018  . Health care maintenance 03/10/2015  . Nonischemic cardiomyopathy (HCC) 03/03/2015  . Tobacco abuse 03/03/2015  . Chronic systolic HF (heart failure) (HCC)   . Essential hypertension 07/08/2013  . Family history of heart disease 07/08/2013  . Alcohol abuse 07/08/2013    Past Surgical History:  Procedure Laterality Date  . CARDIAC CATHETERIZATION N/A 02/24/2015   Procedure: Right/Left Heart Cath and Coronary Angiography;  Surgeon: Peter M Swaziland, MD;  Location: [facility] INVASIVE CV LAB;  Service:  Cardiovascular;  Laterality: N/A;  . CLAVICLE SURGERY     15 years ago  . ORIF ANKLE FRACTURE  02/03/2011   Procedure: OPEN REDUCTION INTERNAL FIXATION (ORIF) ANKLE FRACTURE;  Surgeon: Thera Flake., MD;  Location: [facility] ORS;  Service: Orthopedics;  Laterality: Left;  . STOMACH SURGERY     stop bleeding from a trauma       Home Medications    Prior to Admission medications   Medication Sig Start Date End Date Taking? Authorizing Provider  amoxicillin-clavulanate (AUGMENTIN) 875-125 MG tablet Take 1 tablet by mouth every 12 (twelve) hours. 04/03/18   Dahlia Byes A, NP  benzonatate (TESSALON) 100 MG capsule Take 1 capsule (100 mg total) by mouth every 8 (eight) hours. 04/03/18   Janace Aris, NP  carvedilol (COREG) 3.125 MG tablet Take 2 tablets (6.25 mg total) by mouth 2 (two) times daily with a meal. 03/27/18 06/25/18  Runell Gess, MD  furosemide (LASIX) 20 MG tablet TAKE 20MG  DAILY AND AN EXTRA 20MG  AS NEEDED FOR FLUID RETENTION 03/27/18   Runell Gess, MD  guaiFENesin (MUCINEX) 600 MG 12 hr tablet Take 1 tablet (600 mg total) by mouth 2 (two) times daily. 04/03/18   Dahlia Byes A, NP  lisinopril (PRINIVIL,ZESTRIL) 5 MG tablet Take 1 tablet (5 mg total) by mouth daily. 03/27/18   Runell Gess, MD    Family History Family History  Problem  Relation Age of Onset  . Asthma Mother   . Hypertension Mother   . Heart attack Brother   . Hypertension Brother   . Stroke Neg Hx     Social History Social History   Tobacco Use  . Smoking status: Current Every Day Smoker    Packs/day: 0.20    Last attempt to quit: 03/24/2015    Years since quitting: 3.0  . Smokeless tobacco: Never Used  . Tobacco comment: 3-4 CIAGARETTES A DAY  Substance Use Topics  . Alcohol use: Yes    Alcohol/week: 0.0 standard drinks    Comment: 2-3 sixteen ounce beers daily.  . Drug use: No     Allergies   Patient has no known allergies.   Review of Systems Review of Systems   Physical  Exam Triage Vital Signs ED Triage Vitals  Enc Vitals Group     BP 04/03/18 0937 (!) 137/94     Pulse Rate 04/03/18 0937 71     Resp 04/03/18 0937 16     Temp 04/03/18 0937 98.4 F (36.9 C)     Temp Source 04/03/18 0937 Oral     SpO2 04/03/18 0937 100 %     Weight 04/03/18 0935 145 lb (65.8 kg)     Height --      Head Circumference --      Peak Flow --      Pain Score 04/03/18 0935 0     Pain Loc --      Pain Edu? --      Excl. in GC? --    No data found.  Updated Vital Signs BP (!) 137/94 (BP Location: Left Arm)   Pulse 71   Temp 98.4 F (36.9 C) (Oral)   Resp 16   Wt 145 lb (65.8 kg)   SpO2 100%   BMI 21.41 kg/m   Visual Acuity Right Eye Distance:   Left Eye Distance:   Bilateral Distance:    Right Eye Near:   Left Eye Near:    Bilateral Near:     Physical Exam Vitals signs and nursing note reviewed.  Constitutional:      Appearance: He is well-developed.  HENT:     Head: Normocephalic and atraumatic.     Right Ear: Tympanic membrane and ear canal normal.     Left Ear: Tympanic membrane and ear canal normal.     Nose: Nose normal.     Mouth/Throat:     Pharynx: Oropharynx is clear.  Eyes:     Conjunctiva/sclera: Conjunctivae normal.  Neck:     Musculoskeletal: Neck supple.  Cardiovascular:     Rate and Rhythm: Normal rate and regular rhythm.     Heart sounds: Normal heart sounds. No murmur.  Pulmonary:     Effort: Pulmonary effort is normal. No respiratory distress.     Breath sounds: No stridor. Wheezing and rhonchi present. No rales.  Musculoskeletal: Normal range of motion.  Skin:    General: Skin is warm and dry.  Neurological:     Mental Status: He is alert.  Psychiatric:        Mood and Affect: Mood normal.      UC Treatments / Results  Labs (all labs ordered are listed, but only abnormal results are displayed) Labs Reviewed - No data to display  EKG None  Radiology No results found.  Procedures Procedures (including  critical care time)  Medications Ordered in UC Medications - No data to display  Initial  Impression / Assessment and Plan / UC Course  I have reviewed the triage vital signs and the nursing notes.  Pertinent labs & imaging results that were available during my care of the patient were reviewed by me and considered in my medical decision making (see chart for details).     Symptoms consistent with bronchitis Will treat with prednisone, albuterol inhaler, mucinex, and tessalon pearls.  Albuterol inhaler given in the clinic Follow up as needed for continued or worsening symptoms Told that if he is not feeling better in the next 3 to 4 days he can start the Augmentin.  Pt understanding and agreed.  Final Clinical Impressions(s) / UC Diagnoses   Final diagnoses:  Viral URI with cough     Discharge Instructions     I believe that this is viral upper respiratory infection We will give you an albuterol inhaler for cough, wheezing, SOB Mucinex for cough and congestion Tessalon pearls for worsening cough and to help at night time to sleep.  If your not feeling better in the next few days then go ahead and start the antibiotic.  Follow up as needed for continued or worsening symptoms     ED Prescriptions    Medication Sig Dispense Auth. Provider   benzonatate (TESSALON) 100 MG capsule Take 1 capsule (100 mg total) by mouth every 8 (eight) hours. 21 capsule Michiel Sivley A, NP   guaiFENesin (MUCINEX) 600 MG 12 hr tablet Take 1 tablet (600 mg total) by mouth 2 (two) times daily. 15 tablet Naryiah Schley A, NP   amoxicillin-clavulanate (AUGMENTIN) 875-125 MG tablet Take 1 tablet by mouth every 12 (twelve) hours. 14 tablet Janace ArisBast, Cherrell Maybee A, NP     Controlled Substance Prescriptions [place] Controlled Substance Registry consulted? no   Janace ArisBast, Chaka Boyson A, NP 04/04/18 1551

## 2018-04-09 ENCOUNTER — Ambulatory Visit (INDEPENDENT_AMBULATORY_CARE_PROVIDER_SITE_OTHER): Payer: Self-pay | Admitting: Cardiovascular Disease

## 2018-04-09 ENCOUNTER — Encounter: Payer: Self-pay | Admitting: Cardiovascular Disease

## 2018-04-09 VITALS — BP 136/94 | HR 69 | Ht 69.0 in | Wt 130.8 lb

## 2018-04-09 DIAGNOSIS — I428 Other cardiomyopathies: Secondary | ICD-10-CM

## 2018-04-09 DIAGNOSIS — Z79899 Other long term (current) drug therapy: Secondary | ICD-10-CM

## 2018-04-09 DIAGNOSIS — Z720 Tobacco use: Secondary | ICD-10-CM

## 2018-04-09 DIAGNOSIS — I10 Essential (primary) hypertension: Secondary | ICD-10-CM

## 2018-04-09 DIAGNOSIS — R55 Syncope and collapse: Secondary | ICD-10-CM

## 2018-04-09 DIAGNOSIS — I5022 Chronic systolic (congestive) heart failure: Secondary | ICD-10-CM

## 2018-04-09 MED ORDER — CARVEDILOL 6.25 MG PO TABS: 6.2500 mg | ORAL_TABLET | Freq: Two times a day (BID) | ORAL | 3 refills | Status: DC

## 2018-04-09 MED ORDER — LISINOPRIL 10 MG PO TABS: 10.0000 mg | ORAL_TABLET | Freq: Every day | ORAL | Status: DC

## 2018-04-09 NOTE — Assessment & Plan Note (Signed)
History of essential hypertension her blood pressure measured today 136/94.  He is on carvedilol and lisinopril.  We will uptitrate these.

## 2018-04-09 NOTE — Assessment & Plan Note (Signed)
History of normal cardiac catheterization by Dr. Swaziland in 2016 with an EF of 15% at that time.  His most recent echo performed 03/21/2018 revealed a severely dilated left ventricle with an EF of 15 to 20%.  He does have class II-III symptoms.  I am referring him to advanced heart failure clinic for pharmacologic optimization.  I am not sure that he will be able to afford Entresto.

## 2018-04-09 NOTE — Progress Notes (Signed)
Mr. Darin Brady returns today for follow-up.  His echocardiogram performed 03/21/2018 showed a severely dilated LV with an EF of 15% unchanged from his EF 4 years ago at the time of diagnostic cath when he was demonstrated to have clean coronary arteries.  He had not taken his medicine since.  He is also had several episodes of unexplained syncope which in light of his LV dysfunction most likely is arrhythmogenic.  I am going to titrate his carvedilol and his lisinopril.  I am referring him to advanced heart failure clinic for medication optimization.  Did not think he will be able to afford Entresto.  Also referring him to EP for consideration of ICD implantation.  Runell Gess, M.D., FACP, [facility], [facility] [address] [place], [place]  [address]  [phone] 04/09/2018 11:07 AM

## 2018-04-09 NOTE — Assessment & Plan Note (Signed)
Continued tobacco abuse.  I counseled him about the importance of cessation.

## 2018-04-09 NOTE — Assessment & Plan Note (Signed)
Darin Brady has had several episodes of syncope.  In light of his severe LV dysfunction it certainly possible of these are arrhythmogenic in etiology.  I am going refer him to EP for consideration of ICD implantation for secondary prevention.

## 2018-04-09 NOTE — Patient Instructions (Addendum)
Medication Instructions:  INCREASE YOUR CARVEDILOL TO 6.25 MG, ONE TABLET BY MOUTH TWICE A DAY  INCREASE YOUR LISINOPRIL TO 10 MG, ONE TABLET BY MOUTH DAILY  If you need a refill on your cardiac medications before your next appointment, please call your pharmacy.   Lab work: NONE If you have labs (blood work) drawn today and your tests are completely normal, you will receive your results only by: Marland Kitchen MyChart Message (if you have MyChart) OR . A paper copy in the mail If you have any lab test that is abnormal or we need to change your treatment, we will call you to review the results.  Testing/Procedures: NONE  Follow-Up: At [facility], you and your health needs are our priority.  As part of our continuing mission to provide you with exceptional heart care, we have created designated Provider Care Teams.  These Care Teams include your primary Cardiologist (physician) and Advanced Practice Providers (APPs -  Physician Assistants and Nurse Practitioners) who all work together to provide you with the care you need, when you need it. . You will need a follow up appointment in 3 months.  Please call our office 2 months in advance to schedule this appointment.  You may see Dr. Allyson Sabal or one of the following Advanced Practice Providers on your designated Care Team:   . Corine Shelter, [place] . Azalee Course, PA-C . Micah Flesher, PA-C . Joni Reining, DNP . Theodore Demark, PA-C . Judy Pimple, PA-C . Marjie Skiff, PA-C  Any Other Special Instructions Will Be Listed Below (If Applicable). REFERRAL TO ADVANCED HEART FAILURE CLINIC (SCHEDULE IN 2 WEEKS)  REFERRAL TO ELECTROPHYSIOLOGY (EP) TO DISCUSS ICD IMPLANTATION (SCHEDULE FOR NEXT WEEK)

## 2018-04-11 ENCOUNTER — Telehealth (HOSPITAL_COMMUNITY): Payer: Self-pay | Admitting: Vascular Surgery

## 2018-04-11 NOTE — Telephone Encounter (Signed)
Left pt message to make new chf appt w/ DB OR [facility]

## 2018-04-14 ENCOUNTER — Ambulatory Visit (INDEPENDENT_AMBULATORY_CARE_PROVIDER_SITE_OTHER): Payer: Self-pay | Admitting: Internal Medicine

## 2018-04-14 ENCOUNTER — Encounter: Payer: Self-pay | Admitting: Internal Medicine

## 2018-04-14 VITALS — BP 132/94 | HR 75 | Ht 69.0 in | Wt 131.4 lb

## 2018-04-14 DIAGNOSIS — I10 Essential (primary) hypertension: Secondary | ICD-10-CM

## 2018-04-14 DIAGNOSIS — I5022 Chronic systolic (congestive) heart failure: Secondary | ICD-10-CM

## 2018-04-14 DIAGNOSIS — I428 Other cardiomyopathies: Secondary | ICD-10-CM

## 2018-04-14 MED ORDER — SPIRONOLACTONE 25 MG PO TABS: 25.0000 mg | ORAL_TABLET | Freq: Every day | ORAL | 3 refills | Status: DC

## 2018-04-14 NOTE — Progress Notes (Signed)
ELECTROPHYSIOLOGY CONSULT NOTE  Patient ID: Darin Brady, MRN: [id number], DOB/AGE: 05/01/1955 63 y.o. Admit date: (Not on file) Date of Consult: 04/14/2018  Primary Physician: Darin Lowe, MD Primary Cardiologist: Darin Brady is a 63 y.o. male who is being seen today for the evaluation of ICD at the request of Darin Brady.    HPI Darin Brady is a 63 y.o. male referred for consideration of an ICD.  He presented 2016 with symptoms of heart failure.  Evaluation included an echo and catheterization demonstrating impaired LV function and normal coronary arteries.  He was treated medically according to guidelines; about 3 years ago however, he stopped taking his medicines.  He presented to Dr. Dorma Brady 1/20 with symptoms of recurrent heart failure.  Treated with resumption of ACE inhibitor and beta-blockers and diuretics he is now much improved.  Currently able to lie flat.  Dyspnea on exertion at 100 yards.  No edema.  Denies palpitations.  Dr. Hazle Brady note describes syncope in the spring 2019.  The patient denies loss of consciousness.  He did have episodes where he got short of breath and needed to stop.   DATE TEST EF   12/16 Echo   20-25 %   12/16 LHC  15 % Cors Normal  1/20 Echo  20% LAE ( 45/2.6/35)        Past Medical History:  Diagnosis Date  . Acute otitis media 04/01/2015  . Alcohol abuse   . Chronic combined systolic and diastolic CHF (congestive heart failure) (HCC)    a. 2D Echo 02/22/15 showed EF 20-25%, multiple WMA, grade 2 DD, aortic sclerosis, mild MR, severely dilated LA, cannot exclude PFO.  Marland Kitchen Cough 04/01/2015  . Epigastric pain   . Essential hypertension   . Leukopenia    a. Noted on labs, unclear what prior w/u.  Marland Kitchen NICM (nonischemic cardiomyopathy) (HCC)    a. LHC 1/22016: normal cors.  . Non-ischemic cardiomyopathy (HCC) 03/03/2015  . Pulmonary hypertension (HCC)    a. moderate pulm HTN by cath 02/2015.  Marland Kitchen Syncope 07/08/2013   syncope     . Tobacco abuse       Surgical History:  Past Surgical History:  Procedure Laterality Date  . CARDIAC CATHETERIZATION N/A 02/24/2015   Procedure: Right/Left Heart Cath and Coronary Angiography;  Surgeon: Darin M Swaziland, MD;  Location: [facility] INVASIVE CV LAB;  Service: Cardiovascular;  Laterality: N/A;  . CLAVICLE SURGERY     15 years ago  . ORIF ANKLE FRACTURE  02/03/2011   Procedure: OPEN REDUCTION INTERNAL FIXATION (ORIF) ANKLE FRACTURE;  Surgeon: Thera Flake., MD;  Location: [facility] ORS;  Service: Orthopedics;  Laterality: Left;  . STOMACH SURGERY     stop bleeding from a trauma     Home Meds: Current Meds  Medication Sig  . amoxicillin-clavulanate (AUGMENTIN) 875-125 MG tablet Take 1 tablet by mouth every 12 (twelve) hours.  . benzonatate (TESSALON) 100 MG capsule Take 1 capsule (100 mg total) by mouth every 8 (eight) hours.  . carvedilol (COREG) 6.25 MG tablet Take 1 tablet (6.25 mg total) by mouth 2 (two) times daily with a meal.  . furosemide (LASIX) 20 MG tablet TAKE 20MG  DAILY AND AN EXTRA 20MG  AS NEEDED FOR FLUID RETENTION  . guaiFENesin (MUCINEX) 600 MG 12 hr tablet Take 1 tablet (600 mg total) by mouth 2 (two) times daily.  Marland Kitchen lisinopril (PRINIVIL,ZESTRIL) 10 MG tablet Take 1 tablet (10 mg total) by mouth  daily.    Allergies: No Known Allergies  Social History   Socioeconomic History  . Marital status: Single    Spouse name: Not on file  . Number of children: Not on file  . Years of education: Not on file  . Highest education level: Not on file  Occupational History  . Occupation: Insurance risk surveyor  Social Needs  . Financial resource strain: Not on file  . Food insecurity:    Worry: Not on file    Inability: Not on file  . Transportation needs:    Medical: Not on file    Non-medical: Not on file  Tobacco Use  . Smoking status: Current Every Day Smoker    Packs/day: 0.20    Last attempt to quit: 03/24/2015    Years since quitting: 3.0  . Smokeless tobacco: Never  Used  . Tobacco comment: 3-4 CIAGARETTES A DAY  Substance and Sexual Activity  . Alcohol use: Yes    Alcohol/week: 0.0 standard drinks    Comment: 2-3 sixteen ounce beers daily.  . Drug use: No  . Sexual activity: Yes  Lifestyle  . Physical activity:    Days per week: Not on file    Minutes per session: Not on file  . Stress: Not on file  Relationships  . Social connections:    Talks on phone: Not on file    Gets together: Not on file    Attends religious service: Not on file    Active member of club or organization: Not on file    Attends meetings of clubs or organizations: Not on file    Relationship status: Not on file  . Intimate partner violence:    Fear of current or ex partner: Not on file    Emotionally abused: Not on file    Physically abused: Not on file    Forced sexual activity: Not on file  Other Topics Concern  . Not on file  Social History Narrative  . Not on file     Family History  Problem Relation Age of Onset  . Asthma Mother   . Hypertension Mother   . Heart attack Brother   . Hypertension Brother   . Stroke Neg Hx      ROS:  Please see the history of present illness.     All other systems reviewed and negative.    Physical Exam:  Blood pressure (!) 132/94, pulse 75, height 5\' 9"  (1.753 m), weight 131 lb 6.4 oz (59.6 kg), SpO2 99 %. General: Well developed, well nourished male in no acute distress. Head: Normocephalic, atraumatic, sclera non-icteric, no xanthomas, nares are without discharge. EENT: normal  Lymph Nodes:  none Neck: Negative for carotid bruits. JVD not elevated. Back:without scoliosis kyphosis Lungs: Clear bilaterally to auscultation without wheezes, rales, or rhonchi. Breathing is unlabored. Heart: Irregular RR with S1 S2.  2/6 systolic murmur . No rubs, or gallops appreciated. Abdomen: Soft, non-tender, non-distended with normoactive bowel sounds. No hepatomegaly. No rebound/guarding. No obvious abdominal masses. Msk:   Strength and tone appear normal for age. Extremities: No clubbing or cyanosis. No edema.  Distal pedal pulses are 2+ and equal bilaterally. Skin: Warm and Dry Neuro: Alert and oriented X 3. CN III-XII intact Grossly normal sensory and motor function . Psych:  Responds to questions appropriately with a normal affect.      Labs: Cardiac Enzymes No results for input(s): CKTOTAL, CKMB, TROPONINI in the last 72 hours. CBC Lab Results  Component Value Date  WBC 3.4 (L) 03/23/2015   HGB 13.6 03/23/2015   HCT 40.5 03/23/2015   MCV 95.5 03/23/2015   PLT 159 03/23/2015   PROTIME: No results for input(s): LABPROT, INR in the last 72 hours. Chemistry No results for input(s): NA, K, CL, CO2, BUN, CREATININE, CALCIUM, PROT, BILITOT, ALKPHOS, ALT, AST, GLUCOSE in the last 168 hours.  Invalid input(s): LABALBU Lipids Lab Results  Component Value Date   CHOL 103 03/24/2018   HDL 38 (L) 03/24/2018   LDLCALC 50 03/24/2018   TRIG 77 03/24/2018   BNP No results found for: PROBNP Thyroid Function Tests: No results for input(s): TSH, T4TOTAL, T3FREE, THYROIDAB in the last 72 hours.  Invalid input(s): FREET3 Miscellaneous No results found for: DDIMER  Radiology/Studies:  No results found.  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  He had 2 brothers who died.  1 of the had an echo a year prior to his dying and it was normal.  The other brother had [facility].  We reviewed the potential contribution of alcohol.  On examination he also had significant PVC; we will quantitate that with Holter monitor.  He denies syncope.  He is certainly at high risk for arrhythmic events.  However, prior to proceeding with ICD implantation, it is appropriate that he be on 3 months of guideline directed  therapy.  In the interim, we will begin him on Aldactone.  He is to be scheduled for follow-up metabolic profile in 2 weeks and to see general cardiology for up titration of medications prior to his appointment with heart failure which has not yet been determined.  If his left ventricular function remains impaired after 3 months of guideline directed therapy, we would proceed with ICD implantation at that time.  He is strongly encouraged to decrease his alcohol intake.    Sherryl MangesSteven Gerard Bonus

## 2018-04-14 NOTE — Patient Instructions (Addendum)
Medication Instructions:  Your physician has recommended you make the following change in your medication:   1. Begin Aldactone, 25mg  tablet, once per day  Labwork: Your physician recommends that you return for lab work in:   BMP in 2 weeks.  Testing/Procedures: Your physician has recommended that you wear a holter monitor. Holter monitors are medical devices that record the heart's electrical activity. Doctors most often use these monitors to diagnose arrhythmias. Arrhythmias are problems with the speed or rhythm of the heartbeat. The monitor is a small, portable device. You can wear one while you do your normal daily activities. This is usually used to diagnose what is causing palpitations/syncope (passing out).   Follow-Up: Your physician recommends that you schedule a follow-up appointment in:   3-4 weeks with Dr Gery Pray or one of his PA's  Any Other Special Instructions Will Be Listed Below (If Applicable).     If you need a refill on your cardiac medications before your next appointment, please call your pharmacy.

## 2018-04-30 ENCOUNTER — Other Ambulatory Visit: Payer: Self-pay | Admitting: *Deleted

## 2018-04-30 ENCOUNTER — Other Ambulatory Visit: Payer: Self-pay | Admitting: Internal Medicine

## 2018-04-30 ENCOUNTER — Ambulatory Visit (INDEPENDENT_AMBULATORY_CARE_PROVIDER_SITE_OTHER): Payer: Self-pay

## 2018-04-30 DIAGNOSIS — I10 Essential (primary) hypertension: Secondary | ICD-10-CM

## 2018-04-30 DIAGNOSIS — I428 Other cardiomyopathies: Secondary | ICD-10-CM

## 2018-04-30 DIAGNOSIS — I493 Ventricular premature depolarization: Secondary | ICD-10-CM

## 2018-04-30 DIAGNOSIS — I5022 Chronic systolic (congestive) heart failure: Secondary | ICD-10-CM

## 2018-04-30 LAB — BASIC METABOLIC PANEL
BUN/Creatinine Ratio: 20 (ref 10–24)
BUN: 22 mg/dL (ref 8–27)
CO2: 19 mmol/L — ABNORMAL LOW (ref 20–29)
Calcium: 9.4 mg/dL (ref 8.6–10.2)
Chloride: 103 mmol/L (ref 96–106)
Creatinine, Ser: 1.1 mg/dL (ref 0.76–1.27)
GFR calc non Af Amer: 71 mL/min/{1.73_m2} (ref >59)
GFR, EST AFRICAN AMERICAN: 82 mL/min/{1.73_m2} (ref >59)
Glucose: 121 mg/dL — ABNORMAL HIGH (ref 65–99)
Potassium: 4.3 mmol/L (ref 3.5–5.2)
Sodium: 139 mmol/L (ref 134–144)

## 2018-05-13 ENCOUNTER — Ambulatory Visit (INDEPENDENT_AMBULATORY_CARE_PROVIDER_SITE_OTHER): Payer: Self-pay | Admitting: Cardiovascular Disease

## 2018-05-13 ENCOUNTER — Encounter: Payer: Self-pay | Admitting: Cardiovascular Disease

## 2018-05-13 VITALS — BP 116/72 | HR 74 | Ht 69.0 in | Wt 130.2 lb

## 2018-05-13 DIAGNOSIS — Z79899 Other long term (current) drug therapy: Secondary | ICD-10-CM

## 2018-05-13 DIAGNOSIS — I5022 Chronic systolic (congestive) heart failure: Secondary | ICD-10-CM

## 2018-05-13 DIAGNOSIS — I10 Essential (primary) hypertension: Secondary | ICD-10-CM

## 2018-05-13 MED ORDER — CARVEDILOL 12.5 MG PO TABS: 12.5000 mg | ORAL_TABLET | Freq: Two times a day (BID) | ORAL | 3 refills | Status: DC

## 2018-05-13 NOTE — Assessment & Plan Note (Signed)
History of chronic systolic heart failure with echo performed 03/21/2018 revealing EF of 15 to 20% with a severely dilated left ventricle.  He does have class III symptoms.  He is on carvedilol and lisinopril as well as Spironolactone.  I have referred him to Dr. Berton Mount for consideration of ICD implantation for primary prevention.

## 2018-05-13 NOTE — Patient Instructions (Signed)
Medication Instructions:  Your physician has recommended you make the following change in your medication:  INCREASE CARVEDILOL TO 12.5 MG; TWICE A DAY  If you need a refill on your cardiac medications before your next appointment, please call your pharmacy.   Lab work: NONE If you have labs (blood work) drawn today and your tests are completely normal, you will receive your results only by: Marland Kitchen MyChart Message (if you have MyChart) OR . A paper copy in the mail If you have any lab test that is abnormal or we need to change your treatment, we will call you to review the results.  Testing/Procedures: Your physician has requested that you have an echocardiogram. Echocardiography is a painless test that uses sound waves to create images of your heart. It provides your doctor with information about the size and shape of your heart and how well your heart's chambers and valves are working. This procedure takes approximately one hour. There are no restrictions for this procedure.  SCHEDULE IN 4 WEEKS   Follow-Up: At [facility], you and your health needs are our priority.  As part of our continuing mission to provide you with exceptional heart care, we have created designated Provider Care Teams.  These Care Teams include your primary Cardiologist (physician) and Advanced Practice Providers (APPs -  Physician Assistants and Nurse Practitioners) who all work together to provide you with the care you need, when you need it. . You will need a follow up appointment in 3 months.  Please call our office 2 months in advance to schedule this appointment.  You may see Dr. Allyson Sabal or one of the following Advanced Practice Providers on your designated Care Team:   . Corine Shelter, [place] . Azalee Course, PA-C . Micah Flesher, PA-C . Joni Reining, DNP . Theodore Demark, PA-C . Judy Pimple, PA-C . Marjie Skiff, PA-C  Any Other Special Instructions Will Be Listed Below (If Applicable). FOLLOW UP IN 2 WEEKS  WITH A CLINICAL PHARMACIST IN THE HYPERTENSION CLINIC FOR MEDICATION TITRATION

## 2018-05-13 NOTE — Assessment & Plan Note (Signed)
History of essential hypertension her blood pressure measured today at 116/72.  He is on carvedilol and lisinopril.

## 2018-05-13 NOTE — Progress Notes (Signed)
05/13/2018 Darin Brady   [birth year]  [id number]  Primary Physician Ali Lowe, MD Primary Cardiologist: Runell Gess MD Nicholes Calamity, [place]  HPI:  Darin Brady is a 63 y.o.  thin appearing single African American male with no children referred for evaluation of syncope.  I last saw him in the office 04/09/2018.  He has not worked for 8 years. His risk factors include 40 pack years of tobacco abuse currently smoking one pack per day as well as 2 hypertension. He is a 19 year old brother died of a massive myocardial infarction. He drinks 2-3 beers a day 4 days a week. He's had episodes of syncope, one in March and one in April. There were sudden onset without seizure activity.  He had right and left heart cath performed by Dr. Swaziland 02/24/2015 revealing normal coronary arteries, EF of 50% with moderate pulmonary hypertension.  He continues to smoke 2 cigarettes a day and drinks 3-4 beers a day.  He does have most likely class III symptoms was severe shortness of breath on exertion.  Has not taken any cardiac meds for the last 3 years.  He denies chest pain.  Since I saw him a month ago I have referred him to Dr. Berton Mount for consideration of ICD implantation for primary prevention.  He is currently wearing a Holter monitor.  I did uptitrate his carvedilol mildly since I saw him last which he is tolerating and I will continue to do this.  We will recheck a 2D echo in a month and I will see him back in 3 months for follow-up.   Current Meds  Medication Sig  . amoxicillin-clavulanate (AUGMENTIN) 875-125 MG tablet Take 1 tablet by mouth every 12 (twelve) hours.  . benzonatate (TESSALON) 100 MG capsule Take 1 capsule (100 mg total) by mouth every 8 (eight) hours.  . carvedilol (COREG) 6.25 MG tablet Take 1 tablet (6.25 mg total) by mouth 2 (two) times daily with a meal.  . furosemide (LASIX) 20 MG tablet TAKE 20MG  DAILY AND AN EXTRA 20MG  AS NEEDED FOR FLUID RETENTION  .  guaiFENesin (MUCINEX) 600 MG 12 hr tablet Take 1 tablet (600 mg total) by mouth 2 (two) times daily.  Marland Kitchen lisinopril (PRINIVIL,ZESTRIL) 10 MG tablet Take 1 tablet (10 mg total) by mouth daily.  Marland Kitchen spironolactone (ALDACTONE) 25 MG tablet Take 1 tablet (25 mg total) by mouth daily.     No Known Allergies  Social History   Socioeconomic History  . Marital status: Single    Spouse name: Not on file  . Number of children: Not on file  . Years of education: Not on file  . Highest education level: Not on file  Occupational History  . Occupation: Insurance risk surveyor  Social Needs  . Financial resource strain: Not on file  . Food insecurity:    Worry: Not on file    Inability: Not on file  . Transportation needs:    Medical: Not on file    Non-medical: Not on file  Tobacco Use  . Smoking status: Current Every Day Smoker    Packs/day: 0.20    Last attempt to quit: 03/24/2015    Years since quitting: 3.1  . Smokeless tobacco: Never Used  . Tobacco comment: 3-4 CIAGARETTES A DAY  Substance and Sexual Activity  . Alcohol use: Yes    Alcohol/week: 0.0 standard drinks    Comment: 2-3 sixteen ounce beers daily.  . Drug use: No  . Sexual  activity: Yes  Lifestyle  . Physical activity:    Days per week: Not on file    Minutes per session: Not on file  . Stress: Not on file  Relationships  . Social connections:    Talks on phone: Not on file    Gets together: Not on file    Attends religious service: Not on file    Active member of club or organization: Not on file    Attends meetings of clubs or organizations: Not on file    Relationship status: Not on file  . Intimate partner violence:    Fear of current or ex partner: Not on file    Emotionally abused: Not on file    Physically abused: Not on file    Forced sexual activity: Not on file  Other Topics Concern  . Not on file  Social History Narrative  . Not on file     Review of Systems: General: negative for chills, fever, night  sweats or weight changes.  Cardiovascular: negative for chest pain, dyspnea on exertion, edema, orthopnea, palpitations, paroxysmal nocturnal dyspnea or shortness of breath Dermatological: negative for rash Respiratory: negative for cough or wheezing Urologic: negative for hematuria Abdominal: negative for nausea, vomiting, diarrhea, bright red blood per rectum, melena, or hematemesis Neurologic: negative for visual changes, syncope, or dizziness All other systems reviewed and are otherwise negative except as noted above.    Blood pressure 116/72, pulse 74, height 5\' 9"  (1.753 m), weight 130 lb 3.2 oz (59.1 kg).  General appearance: alert and no distress Neck: no adenopathy, no carotid bruit, no JVD, supple, symmetrical, trachea midline and thyroid not enlarged, symmetric, no tenderness/mass/nodules Lungs: clear to auscultation bilaterally Heart: regular rate and rhythm, S1, S2 normal, no murmur, click, rub or gallop Extremities: extremities normal, atraumatic, no cyanosis or edema Pulses: 2+ and symmetric Skin: Skin color, texture, turgor normal. No rashes or lesions Neurologic: Alert and oriented X 3, normal strength and tone. Normal symmetric reflexes. Normal coordination and gait  EKG not performed today  ASSESSMENT AND PLAN:   Essential hypertension History of essential hypertension her blood pressure measured today at 116/72.  He is on carvedilol and lisinopril.  Chronic systolic HF (heart failure) (HCC) History of chronic systolic heart failure with echo performed 03/21/2018 revealing EF of 15 to 20% with a severely dilated left ventricle.  He does have class III symptoms.  He is on carvedilol and lisinopril as well as Spironolactone.  I have referred him to Dr. Berton Mount for consideration of ICD implantation for primary prevention.      Runell Gess MD FACP,FACC,FAHA, [facility] 05/13/2018 10:34 AM

## 2018-05-29 ENCOUNTER — Ambulatory Visit: Payer: Self-pay

## 2018-06-10 ENCOUNTER — Ambulatory Visit (HOSPITAL_COMMUNITY): Payer: Medicaid Other | Attending: Cardiology

## 2018-06-10 ENCOUNTER — Other Ambulatory Visit: Payer: Self-pay

## 2018-06-10 DIAGNOSIS — I5022 Chronic systolic (congestive) heart failure: Secondary | ICD-10-CM | POA: Insufficient documentation

## 2018-06-10 MED ORDER — PERFLUTREN LIPID MICROSPHERE
1.0000 mL | INTRAVENOUS | Status: AC | PRN
  Administered 2018-06-10: 1 mL via INTRAVENOUS

## 2018-06-20 ENCOUNTER — Telehealth: Payer: Self-pay

## 2018-06-20 ENCOUNTER — Telehealth (INDEPENDENT_AMBULATORY_CARE_PROVIDER_SITE_OTHER): Payer: Self-pay | Admitting: Cardiovascular Disease

## 2018-06-20 DIAGNOSIS — I5022 Chronic systolic (congestive) heart failure: Secondary | ICD-10-CM

## 2018-06-20 DIAGNOSIS — I10 Essential (primary) hypertension: Secondary | ICD-10-CM

## 2018-06-20 DIAGNOSIS — Z720 Tobacco use: Secondary | ICD-10-CM

## 2018-06-20 DIAGNOSIS — I272 Pulmonary hypertension, unspecified: Secondary | ICD-10-CM

## 2018-06-20 DIAGNOSIS — F1010 Alcohol abuse, uncomplicated: Secondary | ICD-10-CM

## 2018-06-20 DIAGNOSIS — I428 Other cardiomyopathies: Secondary | ICD-10-CM

## 2018-06-20 NOTE — Telephone Encounter (Signed)
Left message regarding AVS instructions and advised to call back with questions/concerns

## 2018-06-20 NOTE — Patient Instructions (Addendum)
Medication Instructions:  Your physician recommends that you continue on your current medications as directed. Please refer to the Current Medication list given to you today.  If you need a refill on your cardiac medications before your next appointment, please call your pharmacy.   Lab work: NONE If you have labs (blood work) drawn today and your tests are completely normal, you will receive your results only by: Marland Kitchen MyChart Message (if you have MyChart) OR . A paper copy in the mail If you have any lab test that is abnormal or we need to change your treatment, we will call you to review the results.  Testing/Procedures: NONE  Follow-Up: At [facility], you and your health needs are our priority.  As part of our continuing mission to provide you with exceptional heart care, we have created designated Provider Care Teams.  These Care Teams include your primary Cardiologist (physician) and Advanced Practice Providers (APPs -  Physician Assistants and Nurse Practitioners) who all work together to provide you with the care you need, when you need it. You will need a follow up appointment in 6 months with Dr. Allyson Sabal.  Please call our office 2 months in advance to schedule this appointment.    Any Other Special Instructions Will Be Listed Below (If Applicable). FOLLOW UP WITH DR. Sherryl Manges IN ELECTROPHYSIOLOGY  IN THE NEXT 2 WEEKS FOR DISCUSSION OF ICD IMPLANTATION

## 2018-06-20 NOTE — Progress Notes (Signed)
Virtual Visit via Telephone Note   This visit type was conducted due to national recommendations for restrictions regarding the COVID-19 Pandemic (e.g. social distancing) in an effort to limit this patient's exposure and mitigate transmission in our community.  Due to his co-morbid illnesses, this patient is at least at moderate risk for complications without adequate follow up.  This format is felt to be most appropriate for this patient at this time.  The patient did not have access to video technology/had technical difficulties with video requiring transitioning to audio format only (telephone).  All issues noted in this document were discussed and addressed.  No physical exam could be performed with this format.  Please refer to the patient's chart for his  consent to telehealth for [facility].   Evaluation Performed:  Follow-up visit  Date:  06/20/2018   ID:  Darin Brady, DOB 1955/06/10, MRN [id number]  Patient Location: Home  Provider Location: Home  PCP:  Ali Lowe, MD  Cardiologist: Dr. Nanetta Batty Electrophysiologist: Dr. Berton Mount  Chief Complaint: Nonischemic cardiomyopathy, dyspnea on exertion  History of Present Illness:    Darin Brady is a 63 y.o. male who presents via audio/video conferencing for a telehealth visit today.    Darin Brady is a 63 y.o.  thin appearing single [place] American male with no children referred for evaluation of syncope.I last saw him in the office  05/13/2018. He has not worked for 8 years. His risk factors include 40 pack years of tobacco abuse currently smoking one pack per day as well as 2 hypertension. He is a 40 year old brother died of a massive myocardial infarction. He drinks 2-3 beers a day 4 days a week. He's had episodes of syncope, one in March and one in April. There were sudden onset without seizure activity.  He had right and left heart cath performed by Dr. Swaziland 02/24/2015 revealing normal coronary  arteries, EF of 50% with moderate pulmonary hypertension. He continues to smoke 2 cigarettes a day and drinks 3-4 beers a day. He does have most likely class III symptoms was severe shortness of breath on exertion. Has not taken any cardiac meds for the last 3 years. He denies chest pain.  Since I saw him a month ago I have referred him to Dr. Berton Mount for consideration of ICD implantation for primary prevention.  He had a Holter monitor performed 04/30/2018 that showed PVCs.  There were no arrhythmias noted.   When I initially saw him in January he had class III heart failure and an ultrasound performed 03/21/2018 revealed LV dilatation with an EF of 15 to 20%.  I did begin him on heart failure medications and when he saw Dr. Graciela Husbands back in the office 04/14/2018 his symptoms had significantly improved.  I saw him back on 05/13/2018 at that time his heart failure symptoms were class I or II.  He is aware of the importance of salt avoidance.  I had sent him to Dr. Graciela Husbands for consideration of ICD implantation.  Repeat 2D echo after 3 months of medical therapy performed 06/10/2018 revealed continued severe LV dysfunction with an EF of 15 to 20% and decreased LV dimensions with mild to moderate MR.  He does continues to drink several beers a day but has cut down on his cigarettes from a pack a day down to 3 cigarettes a day.  The patient does not have symptoms concerning for COVID-19 infection (fever, chills, cough, or new shortness of breath).  Past Medical History:  Diagnosis Date  . Acute otitis media 04/01/2015  . Alcohol abuse   . Chronic combined systolic and diastolic CHF (congestive heart failure) (HCC)    a. 2D Echo 02/22/15 showed EF 20-25%, multiple WMA, grade 2 DD, aortic sclerosis, mild MR, severely dilated LA, cannot exclude PFO.  Marland Kitchen Cough 04/01/2015  . Epigastric pain   . Essential hypertension   . Leukopenia    a. Noted on labs, unclear what prior w/u.  Marland Kitchen NICM (nonischemic  cardiomyopathy) (HCC)    a. LHC 1/22016: normal cors.  . Non-ischemic cardiomyopathy (HCC) 03/03/2015  . Pulmonary hypertension (HCC)    a. moderate pulm HTN by cath 02/2015.  Marland Kitchen Syncope 07/08/2013   syncope   . Tobacco abuse    Past Surgical History:  Procedure Laterality Date  . CARDIAC CATHETERIZATION N/A 02/24/2015   Procedure: Right/Left Heart Cath and Coronary Angiography;  Surgeon: Peter M Swaziland, MD;  Location: [facility] INVASIVE CV LAB;  Service: Cardiovascular;  Laterality: N/A;  . CLAVICLE SURGERY     15 years ago  . ORIF ANKLE FRACTURE  02/03/2011   Procedure: OPEN REDUCTION INTERNAL FIXATION (ORIF) ANKLE FRACTURE;  Surgeon: Thera Flake., MD;  Location: [facility] ORS;  Service: Orthopedics;  Laterality: Left;  . STOMACH SURGERY     stop bleeding from a trauma     Current Meds  Medication Sig  . carvedilol (COREG) 12.5 MG tablet Take 1 tablet (12.5 mg total) by mouth 2 (two) times daily with a meal.  . furosemide (LASIX) 20 MG tablet TAKE 20MG  DAILY AND AN EXTRA 20MG  AS NEEDED FOR FLUID RETENTION  . lisinopril (PRINIVIL,ZESTRIL) 10 MG tablet Take 1 tablet (10 mg total) by mouth daily.  Marland Kitchen spironolactone (ALDACTONE) 25 MG tablet Take 1 tablet (25 mg total) by mouth daily.     Allergies:   Patient has no known allergies.   Social History   Tobacco Use  . Smoking status: Current Every Day Smoker    Packs/day: 0.20    Last attempt to quit: 03/24/2015    Years since quitting: 3.2  . Smokeless tobacco: Never Used  . Tobacco comment: 3-4 CIAGARETTES A DAY  Substance Use Topics  . Alcohol use: Yes    Alcohol/week: 0.0 standard drinks    Comment: 2-3 sixteen ounce beers daily.  . Drug use: No     Family Hx: The patient's family history includes Asthma in his mother; Heart attack in his brother; Hypertension in his brother and mother. There is no history of Stroke.  ROS:   Please see the history of present illness.     All other systems reviewed and are negative.   Prior CV  studies:   The following studies were reviewed today:  2D echocardiogram  Labs/Other Tests and Data Reviewed:    EKG:  No ECG reviewed.  Recent Labs: 02/11/2018: B Natriuretic Peptide 1,768.0 03/24/2018: ALT 25 04/30/2018: BUN 22; Creatinine, Ser 1.10; Potassium 4.3; Sodium 139   Recent Lipid Panel Lab Results  Component Value Date/Time   CHOL 103 03/24/2018 08:55 AM   TRIG 77 03/24/2018 08:55 AM   HDL 38 (L) 03/24/2018 08:55 AM   CHOLHDL 2.7 03/24/2018 08:55 AM   CHOLHDL 3.4 02/22/2015 03:40 AM   LDLCALC 50 03/24/2018 08:55 AM    Wt Readings from Last 3 Encounters:  06/20/18 138 lb (62.6 kg)  05/13/18 130 lb 3.2 oz (59.1 kg)  04/14/18 131 lb 6.4 oz (59.6 kg)  Objective:    Vital Signs:  Wt 138 lb (62.6 kg)   BMI 20.38 kg/m    A physical exam was not performed today since this was a telemedicine phone visit.  ASSESSMENT & PLAN:    1. Nonischemic cardiomyopathy- currently on carvedilol 12.5 mg p.o. twice daily, lisinopril and spironolactone.  He is probably class I/II.  A follow-up 2D echo performed 06/10/2018 showed continued severe LV dysfunction with an EF in the 15 to 20% range with LV dilatation.  At this point, he would be a candidate for ICD implantation for primary prevention of sudden death.  I will refer him back to Dr. Graciela HusbandsKlein for consideration of this. 2. Essential hypertension- on carvedilol and lisinopril, his blood pressure was not measured today. 3. Tobacco abuse- he has decreased his tobacco use from 1 pack a day down to 3 cigarettes a day 4. Ethanol abuse- still drinking several beers on a daily basis  COVID-19 Education: The signs and symptoms of COVID-19 were discussed with the patient and how to seek care for testing (follow up with PCP or arrange E-visit).  The importance of social distancing was discussed today.  Time:   Today, I have spent 11 minutes with the patient with telehealth technology discussing the above problems.     Medication  Adjustments/Labs and Tests Ordered: Current medicines are reviewed at length with the patient today.  Concerns regarding medicines are outlined above.  Tests Ordered: No orders of the defined types were placed in this encounter.  Medication Changes: No orders of the defined types were placed in this encounter.   Disposition:  Follow up in 6 month(s)  Signed, Nanetta BattyJonathan Beverlie Kurihara, MD  06/20/2018 11:05 AM     [facility]

## 2018-06-20 NOTE — Telephone Encounter (Signed)
Virtual Visit Pre-Appointment Phone Call  Steps For Call:  1. Confirm consent - "In the setting of the current Covid19 crisis, you are scheduled for a (phone or video) visit with your provider on (date) at (time).  Just as we do with many in-office visits, in order for you to participate in this visit, we must obtain consent.  If you'd like, I can send this to your mychart (if signed up) or email for you to review.  Otherwise, I can obtain your verbal consent now.  All virtual visits are billed to your insurance company just like a normal visit would be.  By agreeing to a virtual visit, we'd like you to understand that the technology does not allow for your provider to perform an examination, and thus may limit your provider's ability to fully assess your condition.  Finally, though the technology is pretty good, we cannot assure that it will always work on either your or our end, and in the setting of a video visit, we may have to convert it to a phone-only visit.  In either situation, we cannot ensure that we have a secure connection.  Are you willing to proceed?"  2. Give patient instructions for WebEx download to smartphone as below if video visit  3. Advise patient to be prepared with any vital sign or heart rhythm information, their current medicines, and a piece of paper and pen handy for any instructions they may receive the day of their visit  4. Inform patient they will receive a phone call 15 minutes prior to their appointment time (may be from unknown caller ID) so they should be prepared to answer  5. Confirm that appointment type is correct in Epic appointment notes (video vs telephone)    TELEPHONE CALL NOTE  Darin Brady has been deemed a candidate for a follow-up tele-health visit to limit community exposure during the Covid-19 pandemic. I spoke with the patient via phone to ensure availability of phone/video source, confirm preferred email & phone number, and discuss  instructions and expectations.  I reminded Darin Brady to be prepared with any vital sign and/or heart rhythm information that could potentially be obtained via home monitoring, at the time of his visit. I reminded Darin Brady to expect a phone call at the time of his visit if his visit.  Did the patient verbally acknowledge consent to treatment? yes  Harlow Asa, RN 06/20/2018    DOWNLOADING THE WEBEX SOFTWARE TO SMARTPHONE  - If Apple, go to App Store and type in WebEx in the search bar. Download [employer], the blue/green circle. The app is free but as with any other app downloads, their phone may require them to verify saved payment information or Apple password. The patient does NOT have to create an account.  - If Android, ask patient to go to [employer] and type in WebEx in the search bar. Download [employer], the blue/green circle. The app is free but as with any other app downloads, their phone may require them to verify saved payment information or Android password. The patient does NOT have to create an account.   CONSENT FOR TELE-HEALTH VISIT - PLEASE REVIEW  I hereby voluntarily request, consent and authorize [facility] and its employed or contracted physicians, physician assistants, nurse practitioners or other licensed health care professionals (the Practitioner), to provide me with telemedicine health care services (the "Services") as deemed necessary by the treating Practitioner. I acknowledge and consent  to receive the Services by the Practitioner via telemedicine. I understand that the telemedicine visit will involve communicating with the Practitioner through live audiovisual communication technology and the disclosure of certain medical information by electronic transmission. I acknowledge that I have been given the opportunity to request an in-person assessment or other available alternative prior to the telemedicine visit and am  voluntarily participating in the telemedicine visit.  I understand that I have the right to withhold or withdraw my consent to the use of telemedicine in the course of my care at any time, without affecting my right to future care or treatment, and that the Practitioner or I may terminate the telemedicine visit at any time. I understand that I have the right to inspect all information obtained and/or recorded in the course of the telemedicine visit and may receive copies of available information for a reasonable fee.  I understand that some of the potential risks of receiving the Services via telemedicine include:  Marland Kitchen Delay or interruption in medical evaluation due to technological equipment failure or disruption; . Information transmitted may not be sufficient (e.g. poor resolution of images) to allow for appropriate medical decision making by the Practitioner; and/or  . In rare instances, security protocols could fail, causing a breach of personal health information.  Furthermore, I acknowledge that it is my responsibility to provide information about my medical history, conditions and care that is complete and accurate to the best of my ability. I acknowledge that Practitioner's advice, recommendations, and/or decision may be based on factors not within their control, such as incomplete or inaccurate data provided by me or distortions of diagnostic images or specimens that may result from electronic transmissions. I understand that the practice of medicine is not an exact science and that Practitioner makes no warranties or guarantees regarding treatment outcomes. I acknowledge that I will receive a copy of this consent concurrently upon execution via email to the email address I last provided but may also request a printed copy by calling the office of [place].    I understand that my insurance will be billed for this visit.   I have read or had this consent read to me. . I understand the  contents of this consent, which adequately explains the benefits and risks of the Services being provided via telemedicine.  . I have been provided ample opportunity to ask questions regarding this consent and the Services and have had my questions answered to my satisfaction. . I give my informed consent for the services to be provided through the use of telemedicine in my medical care  By participating in this telemedicine visit I agree to the above.

## 2018-07-01 ENCOUNTER — Ambulatory Visit: Payer: Self-pay

## 2018-07-01 ENCOUNTER — Telehealth: Payer: Self-pay

## 2018-07-01 NOTE — Telephone Encounter (Signed)
I called and left patient a message about upcoming appointment tomorrow. Need to know if patient has a smart phone, tablet or computer for video visit tomorrow.

## 2018-07-02 ENCOUNTER — Other Ambulatory Visit: Payer: Self-pay

## 2018-07-02 ENCOUNTER — Telehealth (INDEPENDENT_AMBULATORY_CARE_PROVIDER_SITE_OTHER): Payer: Self-pay | Admitting: Internal Medicine

## 2018-07-02 ENCOUNTER — Telehealth: Payer: Self-pay

## 2018-07-02 DIAGNOSIS — I5022 Chronic systolic (congestive) heart failure: Secondary | ICD-10-CM

## 2018-07-02 DIAGNOSIS — I428 Other cardiomyopathies: Secondary | ICD-10-CM

## 2018-07-02 DIAGNOSIS — I493 Ventricular premature depolarization: Secondary | ICD-10-CM

## 2018-07-02 DIAGNOSIS — I10 Essential (primary) hypertension: Secondary | ICD-10-CM

## 2018-07-02 DIAGNOSIS — I429 Cardiomyopathy, unspecified: Secondary | ICD-10-CM

## 2018-07-02 NOTE — Telephone Encounter (Signed)
Pt will need to be contacted to schedule an ICD implant when elective procedure restrictions are lifted.

## 2018-07-02 NOTE — Telephone Encounter (Signed)
Patient had appointment today.

## 2018-07-02 NOTE — Progress Notes (Signed)
Electrophysiology TeleHealth Note   Due to national recommendations of social distancing due to COVID 19, an audio/video telehealth visit is felt to be most appropriate for this patient at this time.  See MyChart message from today for the patient's consent to telehealth for [facility]   Date:  07/02/2018   ID:  Delbert Testani, DOB Nov 08, 1955, MRN [id number]  Location: patient's home  Provider location: [address] [place]  Evaluation Performed: Follow-up visit  PCP:  Ali Lowe, MD  Cardiologist:   Dorma Russell Electrophysiologist:  SK   Chief Complaint:  Consideration for an ICD   History of Present Illness:    Darin Brady is a 63 y.o. male who presents via audio  conferencing for a telehealth visit today.  Since last being seen in our clinic for reconsideration of and ICD  the patient reports doing quite well  No chest pains, edema, nocturnal dyspnea or orthopnea.  No palpitations or presyncope.  Dyspnea on exertion is much reduced.  He is now able to carry groceries up the 6 steps into his house without dyspnea  Compliant with his medications   DATE TEST EF   12/16 Echo   20-25 %   12/16 LHC  15 % Cors Normal  1/20 Echo  20% LAE ( 45/2.6/35)  3/20 Echo  15-20%     Date Cr K Hgb   2/20 1.10 4.3            The patient denies symptoms of fevers, chills, cough, or new SOB worrisome for COVID 19.    Past Medical History:  Diagnosis Date  . Acute otitis media 04/01/2015  . Alcohol abuse   . Chronic combined systolic and diastolic CHF (congestive heart failure) (HCC)    a. 2D Echo 02/22/15 showed EF 20-25%, multiple WMA, grade 2 DD, aortic sclerosis, mild MR, severely dilated LA, cannot exclude PFO.  Marland Kitchen Cough 04/01/2015  . Epigastric pain   . Essential hypertension   . Leukopenia    a. Noted on labs, unclear what prior w/u.  Marland Kitchen NICM (nonischemic cardiomyopathy) (HCC)    a. LHC 1/22016: normal cors.  . Non-ischemic cardiomyopathy (HCC)  03/03/2015  . Pulmonary hypertension (HCC)    a. moderate pulm HTN by cath 02/2015.  Marland Kitchen Syncope 07/08/2013   syncope   . Tobacco abuse     Past Surgical History:  Procedure Laterality Date  . CARDIAC CATHETERIZATION N/A 02/24/2015   Procedure: Right/Left Heart Cath and Coronary Angiography;  Surgeon: Peter M Swaziland, MD;  Location: [facility] INVASIVE CV LAB;  Service: Cardiovascular;  Laterality: N/A;  . CLAVICLE SURGERY     15 years ago  . ORIF ANKLE FRACTURE  02/03/2011   Procedure: OPEN REDUCTION INTERNAL FIXATION (ORIF) ANKLE FRACTURE;  Surgeon: Thera Flake., MD;  Location: [facility] ORS;  Service: Orthopedics;  Laterality: Left;  . STOMACH SURGERY     stop bleeding from a trauma    Current Outpatient Medications  Medication Sig Dispense Refill  . carvedilol (COREG) 12.5 MG tablet Take 1 tablet (12.5 mg total) by mouth 2 (two) times daily with a meal. 180 tablet 3  . furosemide (LASIX) 20 MG tablet TAKE 20MG  DAILY AND AN EXTRA 20MG  AS NEEDED FOR FLUID RETENTION 30 tablet 6  . lisinopril (PRINIVIL,ZESTRIL) 10 MG tablet Take 1 tablet (10 mg total) by mouth daily.    Marland Kitchen spironolactone (ALDACTONE) 25 MG tablet Take 1 tablet (25 mg total) by mouth daily. 90 tablet  3  . amoxicillin-clavulanate (AUGMENTIN) 875-125 MG tablet Take 1 tablet by mouth every 12 (twelve) hours. (Patient not taking: Reported on 07/02/2018) 14 tablet 0  . benzonatate (TESSALON) 100 MG capsule Take 1 capsule (100 mg total) by mouth every 8 (eight) hours. (Patient not taking: Reported on 06/20/2018) 21 capsule 0  . guaiFENesin (MUCINEX) 600 MG 12 hr tablet Take 1 tablet (600 mg total) by mouth 2 (two) times daily. (Patient not taking: Reported on 06/20/2018) 15 tablet 0   No current facility-administered medications for this visit.     Allergies:   Patient has no known allergies.   Social History:  The patient  reports that he has been smoking. He has been smoking about 0.20 packs per day. He has never used smokeless tobacco. He  reports current alcohol use. He reports that he does not use drugs.   Family History:  The patient's   family history includes Asthma in his mother; Heart attack in his brother; Hypertension in his brother and mother.   ROS:  Please see the history of present illness.   All other systems are personally reviewed and negative.    Exam:    Vital Signs:  There were no vitals taken for this visit.    Well appearing, alert and conversant, regular work of breathing,  good skin color Eyes- anicteric, neuro- grossly intact, skin- no apparent rash or lesions or cyanosis, mouth- oral mucosa is pink   Labs/Other Tests and Data Reviewed:    Recent Labs: 02/11/2018: B Natriuretic Peptide 1,768.0 03/24/2018: ALT 25 04/30/2018: BUN 22; Creatinine, Ser 1.10; Potassium 4.3; Sodium 139   Wt Readings from Last 3 Encounters:  06/20/18 138 lb (62.6 kg)  05/13/18 130 lb 3.2 oz (59.1 kg)  04/14/18 131 lb 6.4 oz (59.6 kg)     Other studies personally reviewed: Additional studies/ records that were reviewed today include: As above  Incl labs and echo  Review of the above records today demonstrates: As above  Prior radiographs:  None     ASSESSMENT & PLAN:    Cardiomyopathy presumed nonischemic  Congestive heart failure-class II-chronic-systolic  PVCs  Alcohol  Cardiomyopathy persists on guideline directed therapy.  PVCs were excluded as a likely contributor.  We have discussed the potential benefit of ICD implantation for primary prevention of sudden death.  We have reviewed the risks and benefits; he would like to proceed.  Given that this is primary prevention, will anticipate undertaking this in the semielective phase of post COVID procedures  For now we will continue his current medications.  COVID 19 screen The patient denies symptoms of COVID 19 at this time.  The importance of social distancing was discussed today.  Follow-up:  4 weeks for ICD implant for primary prevention     Current medicines are reviewed at length with the patient today.   The patient does not have concerns regarding his medicines.  The following changes were made today:  none  Labs/ tests ordered today include:   No orders of the defined types were placed in this encounter.   Future tests ( post COVID )  ICD implant   Patient Risk:  after full review of this patients clinical status, I feel that they are at moderate*  risk at this time.  Today, I have spent 6* minutes with the patient with telehealth technology discussing the above.  Signed, Sherryl Manges, MD  07/02/2018 11:08 AM     [facility] [address]  [place] [address] [phone] (office) [phone] (fax)

## 2018-07-07 ENCOUNTER — Encounter (HOSPITAL_COMMUNITY): Payer: Self-pay | Admitting: Cardiology

## 2018-07-09 ENCOUNTER — Ambulatory Visit: Payer: Self-pay | Admitting: Cardiovascular Disease

## 2018-07-15 NOTE — Telephone Encounter (Signed)
Called pt to discuss his upcoming ICD implant. He has agreed to have Dr Elberta Fortis perform this on Friday May 8 per the request of Dr Graciela Husbands. He has verbalized understanding of pre and post procedural instructions as follows:  [facility] [employer]. Check in at patient registration. Procedure scheduled for 11:30am.   This will require an over night stay. No visitors will be allowed during his admission. He will contact his ride prior to his discharge on Saturday May 9th.  He will have nothing to eat or drink after midnight the night prior to his procedure. He will hold his Lasix and Spironolactone, but take his remaining medications the morning of his procedure.  He will shower the evening before and morning of his procedure with an antibacterial soap (like Dial).    He will have labs drawn at the hospital the day of his procedure.  He has agreed with his wound check on May 20th at 1000 and 91 day follow up on Aug 17th at 2:45pm.  Pt will call the office for any additional questions regarding this procedure.

## 2018-07-18 ENCOUNTER — Ambulatory Visit (HOSPITAL_COMMUNITY)
Admission: RE | Disposition: A | Discharge status: Home / Self Care | Payer: Self-pay | Source: Home / Self Care | Attending: Cardiology

## 2018-07-18 ENCOUNTER — Ambulatory Visit (HOSPITAL_COMMUNITY): Payer: Medicaid Other

## 2018-07-18 ENCOUNTER — Ambulatory Visit (HOSPITAL_COMMUNITY)
Admission: RE | Admit: 2018-07-18 | Discharge: 2018-07-18 | Disposition: A | Discharge status: Home / Self Care | Payer: Medicaid Other | Attending: Cardiology | Admitting: Cardiology

## 2018-07-18 ENCOUNTER — Other Ambulatory Visit: Payer: Self-pay

## 2018-07-18 DIAGNOSIS — Z006 Encounter for examination for normal comparison and control in clinical research program: Secondary | ICD-10-CM | POA: Insufficient documentation

## 2018-07-18 DIAGNOSIS — I5042 Chronic combined systolic (congestive) and diastolic (congestive) heart failure: Secondary | ICD-10-CM | POA: Diagnosis not present

## 2018-07-18 DIAGNOSIS — I110 Hypertensive heart disease with heart failure: Secondary | ICD-10-CM | POA: Insufficient documentation

## 2018-07-18 DIAGNOSIS — I255 Ischemic cardiomyopathy: Secondary | ICD-10-CM | POA: Insufficient documentation

## 2018-07-18 DIAGNOSIS — F17210 Nicotine dependence, cigarettes, uncomplicated: Secondary | ICD-10-CM | POA: Diagnosis not present

## 2018-07-18 DIAGNOSIS — Z79899 Other long term (current) drug therapy: Secondary | ICD-10-CM | POA: Insufficient documentation

## 2018-07-18 DIAGNOSIS — Z8249 Family history of ischemic heart disease and other diseases of the circulatory system: Secondary | ICD-10-CM | POA: Insufficient documentation

## 2018-07-18 DIAGNOSIS — I428 Other cardiomyopathies: Secondary | ICD-10-CM | POA: Insufficient documentation

## 2018-07-18 DIAGNOSIS — I272 Pulmonary hypertension, unspecified: Secondary | ICD-10-CM | POA: Insufficient documentation

## 2018-07-18 DIAGNOSIS — Z95818 Presence of other cardiac implants and grafts: Secondary | ICD-10-CM

## 2018-07-18 HISTORY — PX: ICD IMPLANT: EP1208

## 2018-07-18 LAB — SURGICAL PCR SCREEN
MRSA, PCR: NEGATIVE
Staphylococcus aureus: NEGATIVE

## 2018-07-18 LAB — BASIC METABOLIC PANEL
Anion gap: 10 (ref 5–15)
BUN: 21 mg/dL (ref 8–23)
CO2: 20 mmol/L — ABNORMAL LOW (ref 22–32)
Calcium: 8.6 mg/dL — ABNORMAL LOW (ref 8.9–10.3)
Chloride: 107 mmol/L (ref 98–111)
Creatinine, Ser: 0.89 mg/dL (ref 0.61–1.24)
GFR calc Af Amer: >60 mL/min (ref >60)
GFR calc non Af Amer: >60 mL/min (ref >60)
Glucose, Bld: 94 mg/dL (ref 70–99)
Potassium: 3.9 mmol/L (ref 3.5–5.1)
Sodium: 137 mmol/L (ref 135–145)

## 2018-07-18 LAB — CBC
HCT: 33.8 % — ABNORMAL LOW (ref 39.0–52.0)
Hemoglobin: 11.3 g/dL — ABNORMAL LOW (ref 13.0–17.0)
MCH: 32.3 pg (ref 26.0–34.0)
MCHC: 33.4 g/dL (ref 30.0–36.0)
MCV: 96.6 fL (ref 80.0–100.0)
Platelets: 222 10*3/uL (ref 150–400)
RBC: 3.5 MIL/uL — ABNORMAL LOW (ref 4.22–5.81)
RDW: 16 % — ABNORMAL HIGH (ref 11.5–15.5)
WBC: 3.9 10*3/uL — ABNORMAL LOW (ref 4.0–10.5)
nRBC: 0 % (ref 0.0–0.2)

## 2018-07-18 IMAGING — CR CHEST - 2 VIEW
2 series · 2 of 2 positions shown · non-contrast
Comparison: 03/23/2015

CLINICAL DATA: Cardiac device placement

EXAM:
CHEST - 2 VIEW

[w chest pa]
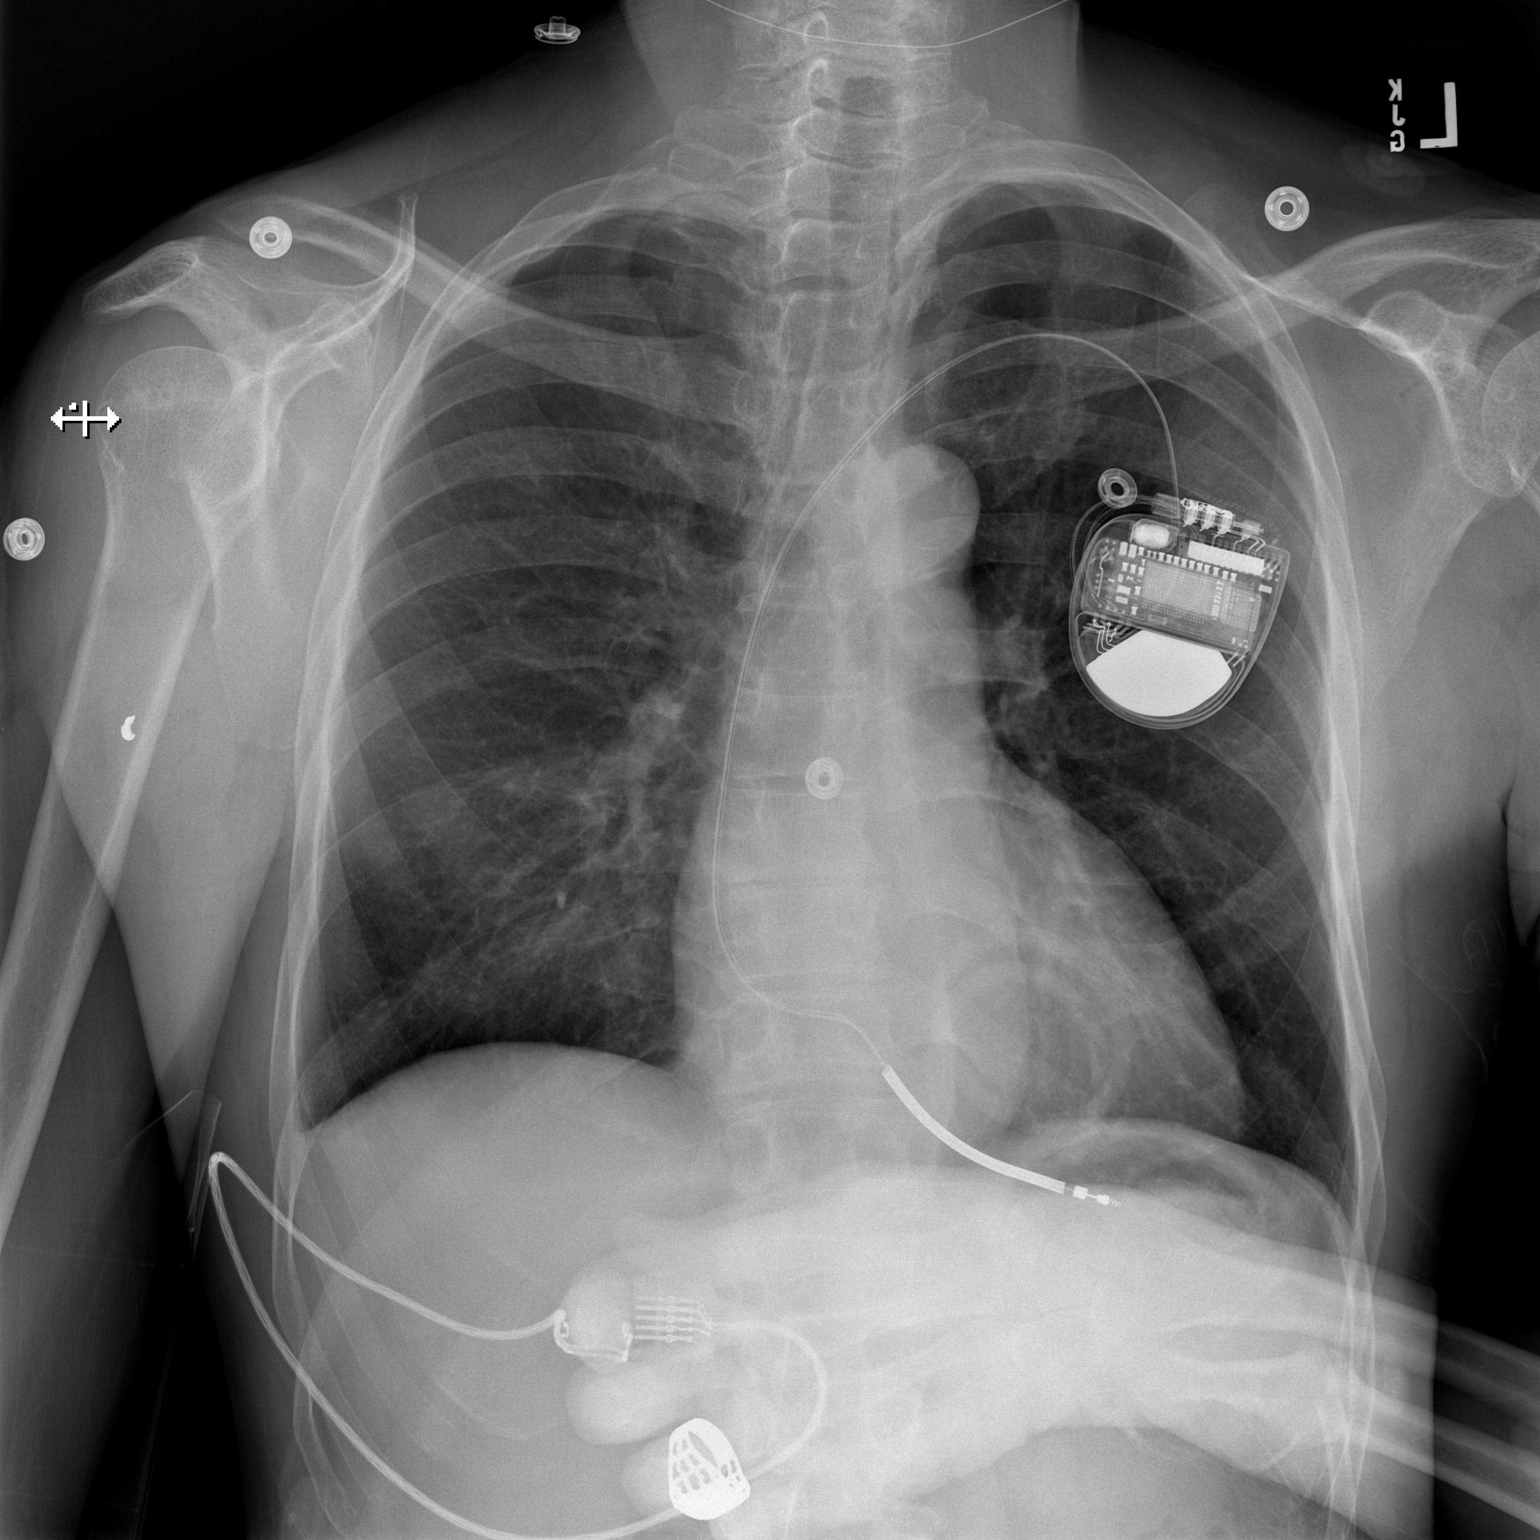

[w chest lat]
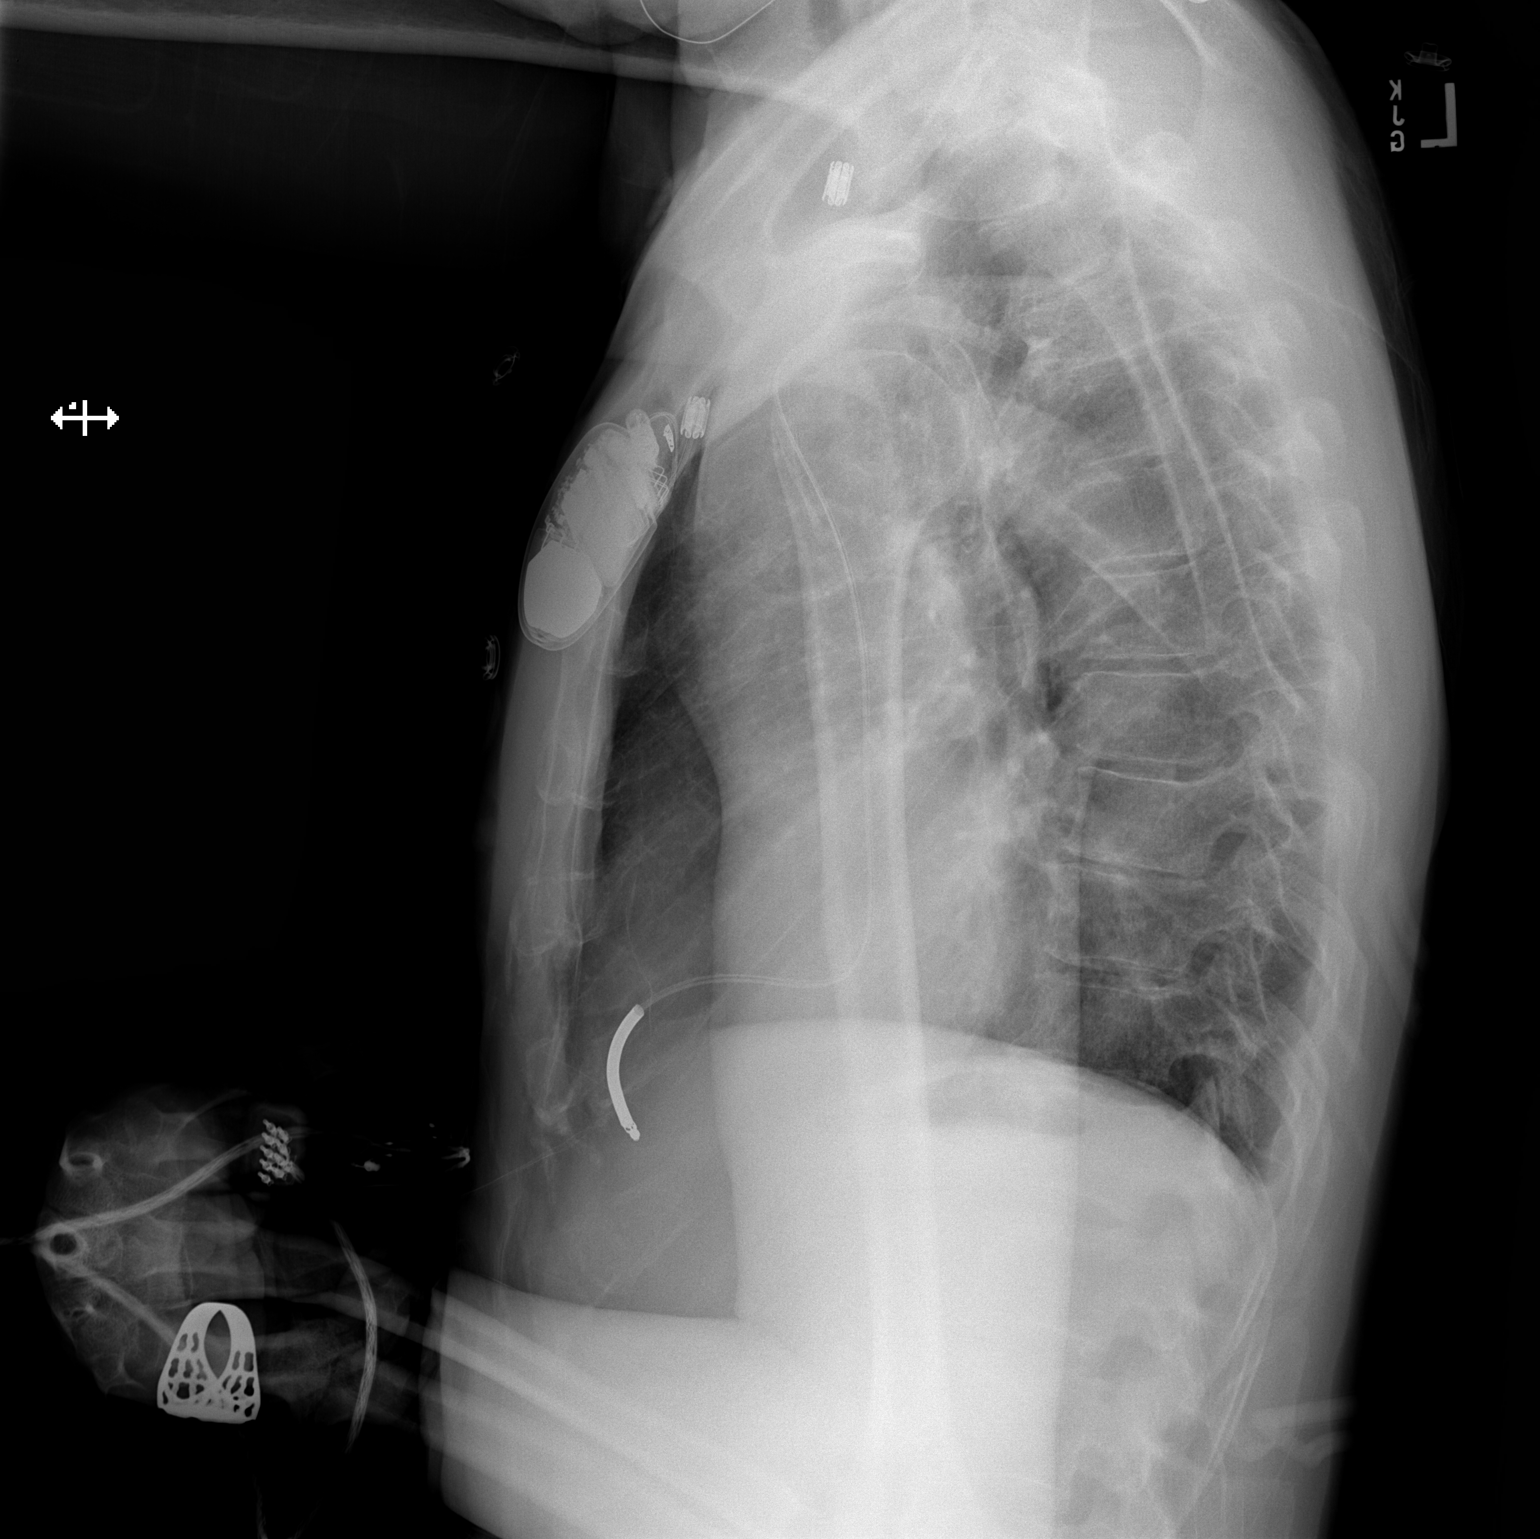

[2 of 2 positions shown; findings below may reference images not displayed]

FINDINGS: The patient is status post placement of a single lead ICD. There is
no left-sided pneumothorax. The cardiac silhouette is stable. The
lungs are clear. There is a trace right-sided pleural effusion. No
acute osseous abnormality. There is an unchanged metallic density
projecting over the proximal right humerus.
IMPRESSION: Status post placement of a left-sided cardiac device with no
evidence of a pneumothorax.

## 2018-07-18 SURGERY — ICD IMPLANT

## 2018-07-18 MED ORDER — LIDOCAINE HCL 1 % IJ SOLN
INTRAMUSCULAR | Status: AC
  Filled 2018-07-18: qty 60

## 2018-07-18 MED ORDER — CEFAZOLIN SODIUM-DEXTROSE 2-4 GM/100ML-% IV SOLN
INTRAVENOUS | Status: AC
  Filled 2018-07-18: qty 100

## 2018-07-18 MED ORDER — HEPARIN (PORCINE) IN NACL 1000-0.9 UT/500ML-% IV SOLN
INTRAVENOUS | Status: AC
  Filled 2018-07-18: qty 500

## 2018-07-18 MED ORDER — ONDANSETRON HCL 4 MG/2ML IJ SOLN: 4.0000 mg | Freq: Four times a day (QID) | INTRAMUSCULAR | Status: DC | PRN

## 2018-07-18 MED ORDER — HEPARIN (PORCINE) IN NACL 1000-0.9 UT/500ML-% IV SOLN
INTRAVENOUS | Status: DC | PRN
  Administered 2018-07-18: 500 mL

## 2018-07-18 MED ORDER — SODIUM CHLORIDE 0.9 % IV SOLN
INTRAVENOUS | Status: AC
  Filled 2018-07-18: qty 2

## 2018-07-18 MED ORDER — CEFAZOLIN SODIUM-DEXTROSE 1-4 GM/50ML-% IV SOLN: 1.0000 g | Freq: Four times a day (QID) | INTRAVENOUS | Status: DC

## 2018-07-18 MED ORDER — CHLORHEXIDINE GLUCONATE 4 % EX LIQD: 60.0000 mL | Freq: Once | CUTANEOUS | Status: DC

## 2018-07-18 MED ORDER — MUPIROCIN 2 % EX OINT
1.0000 "application " | TOPICAL_OINTMENT | Freq: Once | CUTANEOUS | Status: AC
  Administered 2018-07-18: 1 via TOPICAL

## 2018-07-18 MED ORDER — MIDAZOLAM HCL 5 MG/5ML IJ SOLN
INTRAMUSCULAR | Status: DC | PRN
  Administered 2018-07-18 (×2): 1 mg via INTRAVENOUS

## 2018-07-18 MED ORDER — FENTANYL CITRATE (PF) 100 MCG/2ML IJ SOLN
INTRAMUSCULAR | Status: AC
  Filled 2018-07-18: qty 2

## 2018-07-18 MED ORDER — CEFAZOLIN SODIUM-DEXTROSE 2-4 GM/100ML-% IV SOLN
2.0000 g | INTRAVENOUS | Status: AC
  Administered 2018-07-18: 2 g via INTRAVENOUS

## 2018-07-18 MED ORDER — FENTANYL CITRATE (PF) 100 MCG/2ML IJ SOLN
INTRAMUSCULAR | Status: DC | PRN
  Administered 2018-07-18: 12.5 ug via INTRAVENOUS
  Administered 2018-07-18: 25 ug via INTRAVENOUS

## 2018-07-18 MED ORDER — ACETAMINOPHEN 325 MG PO TABS: 325.0000 mg | ORAL_TABLET | ORAL | Status: DC | PRN

## 2018-07-18 MED ORDER — MIDAZOLAM HCL 5 MG/5ML IJ SOLN
INTRAMUSCULAR | Status: AC
  Filled 2018-07-18: qty 5

## 2018-07-18 MED ORDER — SODIUM CHLORIDE 0.9 % IV SOLN
80.0000 mg | INTRAVENOUS | Status: AC
  Administered 2018-07-18: 80 mg
  Filled 2018-07-18: qty 2

## 2018-07-18 MED ORDER — SODIUM CHLORIDE 0.9 % IV SOLN
INTRAVENOUS | Status: DC
  Administered 2018-07-18: 11:00:00 via INTRAVENOUS

## 2018-07-18 MED ORDER — MUPIROCIN 2 % EX OINT
TOPICAL_OINTMENT | CUTANEOUS | Status: AC
  Filled 2018-07-18: qty 22

## 2018-07-18 MED ORDER — LIDOCAINE HCL (PF) 1 % IJ SOLN
INTRAMUSCULAR | Status: DC | PRN
  Administered 2018-07-18: 60 mL

## 2018-07-18 SURGICAL SUPPLY — 7 items
CABLE SURGICAL S-101-97-12 (CABLE) ×3 IMPLANT
ICD VISIA MRI VR DVFB1D4 (ICD Generator) IMPLANT
LEAD SPRINT QUAT SEC 6935M-62 (Lead) ×2 IMPLANT
PAD PRO RADIOLUCENT 2001M-C (PAD) ×3 IMPLANT
SHEATH CLASSIC 9F (SHEATH) ×2 IMPLANT
TRAY PACEMAKER INSERTION (PACKS) ×3 IMPLANT
VISIA MRI VR DVFB1D4 (ICD Generator) ×3 IMPLANT

## 2018-07-18 NOTE — H&P (Signed)
ICD Criteria  Current LVEF:15/20%. Within 12 months prior to implant: Yes   Heart failure history: Yes, Class II  Cardiomyopathy history: Yes, Non-Ischemic Cardiomyopathy.  Atrial Fibrillation/Atrial Flutter: No.  Ventricular tachycardia history: No.  Cardiac arrest history: No.  History of syndromes with risk of sudden death: No.  Previous ICD: No.  Current ICD indication: Primary  PPM indication: No.  Class I or II Bradycardia indication present: No  Beta Blocker therapy for 3 or more months: Yes, prescribed.   Ace Inhibitor/ARB therapy for 3 or more months: Yes, prescribed.    I have seen Darin Brady is a 63 y.o. malepre-procedural and has been referred by Sherryl Manges for consideration of ICD implant for primary prevention of sudden death.  The patient's chart has been reviewed and they meet criteria for ICD implant.  I have had a thorough discussion with the patient reviewing options.  The patient and their family (if available) have had opportunities to ask questions and have them answered. The patient and I have decided together through the [facility] Share Decision Support Tool to implant ICD at this time.  Risks, benefits, alternatives to ICD implantation were discussed in detail with the patient today. The patient  understands that the risks include but are not limited to bleeding, infection, pneumothorax, perforation, tamponade, vascular damage, renal failure, MI, stroke, death, inappropriate shocks, and lead dislodgement and wishes to proceed.

## 2018-07-18 NOTE — Discharge Instructions (Signed)
Cardioverter Defibrillator Implantation, Care After This sheet gives you information about how to care for yourself after your procedure. Your health care provider may also give you more specific instructions. If you have problems or questions, contact your health care provider. What can I expect after the procedure? After the procedure, it is common to have:  Some pain. It may last a few days.  A slight bump over the skin where the device was placed. Sometimes, it is possible to feel the device under the skin. This is normal. During the months and years after your procedure, your health care provider will check the device, the leads, and the battery every few months. Eventually, when the battery is low, the device will be replaced. Follow these instructions at home: Medicines  Take over-the-counter and prescription medicines only as told by your health care provider.  If you were prescribed an antibiotic medicine, take it as told by your health care provider. Do not stop taking the antibiotic even if you start to feel better. Incision care      Follow instructions from your health care provider about how to take care of your incision area. Make sure you: ? Wash your hands with soap and water before you change your bandage (dressing). If soap and water are not available, use hand sanitizer. ? Change your dressing as told by your health care provider. ? Leave stitches (sutures), skin glue, or adhesive strips in place. These skin closures may need to stay in place for 2 weeks or longer. If adhesive strip edges start to loosen and curl up, you may trim the loose edges. Do not remove adhesive strips completely unless your health care provider tells you to do that.  Check your incision area every day for signs of infection. Check for: ? More redness, swelling, or pain. ? More fluid or blood. ? Warmth. ? Pus or a bad smell.  Do not use lotions or ointments near the incision area unless told  by your health care provider.  Keep the incision area clean and dry for 2-3 days after the procedure or for as long as told by your health care provider. It takes several weeks for the incision site to heal completely.  Do not take baths, swim, or use a hot tub until your health care provider approves. Activity  Try to walk a little every day. Exercising is important after this procedure. Also, use your shoulder on the side of the defibrillator in daily tasks that do not require a lot of motion.  For at least 6 weeks: Do not lie on left side x 24hours ? Do not lift your upper arm above your shoulders. This means no tennis, golf, or swimming for this period of time. If you tend to sleep with your arm above your head, use a restraint to prevent this during sleep. ? Avoid sudden jerking, pulling, or chopping movements that pull your upper arm far away from your body.  Ask your health care provider when you may go back to work.  Check with your health care provider before you start to drive or play sports. Electric and magnetic fields  Tell all health care providers that you have a defibrillator. This may prevent them from giving you an MRI scan because strong magnets are used for that test.  If you must pass through a metal detector, quickly walk through it. Do not stop under the detector, and do not stand near it.  Avoid places or objects that have  a strong electric or magnetic field, including: ? Airport [employer]. At the airport, let officials know that you have a defibrillator. Your defibrillator ID card will let you be checked in a way that is safe for you and will not damage your defibrillator. Also, do not let a security person wave a magnetic wand near your defibrillator. That can make it stop working. ? Power plants. ? Large electrical generators. ? Anti-theft systems or electronic article surveillance (EAS). ? Radiofrequency transmission towers, such as cell phone and radio  towers.  Do not use amateur (ham) radio equipment or electric (arc) welding torches. Some devices are safe to use if held at least 12 inches (30 cm) from your defibrillator. These include power tools, lawn mowers, and speakers. If you are unsure if something is safe to use, ask your health care provider.  Do not use MP3 player headphones. They have magnets.  You may safely use electric blankets, heating pads, computers, and microwave ovens.  When using your cell phone, hold it to the ear that is on the opposite side from the defibrillator. Do not leave your cell phone in a pocket over the defibrillator. General instructions  Follow diet instructions from your health care provider, if this applies.  Always keep your defibrillator ID card with you. The card should list the implant date, device model, and manufacturer. Consider wearing a medical alert bracelet or necklace.  Have your defibrillator checked every 3-6 months or as often as told by your health care provider. Most defibrillators last for 4-8 years.  Keep all follow-up visits as told by your health care provider. This is important for your health care provider to make sure your chest is healing the way it should. Ask your health care provider when you should come back to have your stitches or staples taken out. Contact a health care provider if:  You feel one shock in your chest.  You gain weight suddenly.  Your legs or feet swell more than they have before.  It feels like your heart is fluttering or skipping beats (heart palpitations).  You have more redness, swelling, or pain around your incision.  You have more fluid or blood coming from your incision.  Your incision feels warm to the touch.  You have pus or a bad smell coming from your incision.  You have a fever. Get help right away if:  You have chest pain.  You feel more than one shock.  You feel more short of breath than you have felt before.  You feel  more light-headed than you have felt before.  Your incision starts to open up. This information is not intended to replace advice given to you by your health care provider. Make sure you discuss any questions you have with your health care provider. Document Released: 09/15/2004 Document Revised: 05/17/2016 Document Reviewed: 08/03/2015 Elsevier Interactive Patient Education  2019 [employer].

## 2018-07-21 ENCOUNTER — Encounter (HOSPITAL_COMMUNITY): Payer: Self-pay | Admitting: Cardiology

## 2018-07-21 MED FILL — Lidocaine HCl Local Inj 1%: INTRAMUSCULAR | Qty: 60 | Status: AC

## 2018-07-22 ENCOUNTER — Telehealth: Payer: Self-pay | Admitting: Internal Medicine

## 2018-07-22 NOTE — Telephone Encounter (Signed)
Patient called stating had a procedure done on Friday they told him not to drive that day, but it's been 5 days now, and he is wondering if he can drive his scooter.

## 2018-07-22 NOTE — Telephone Encounter (Signed)
Spoke with pt and per the device clinic, pt should refrain from driving his scooter until his wound check. Steering may be > 10lbs of pressure which increases risk of lead dislodgment. Pt verbalized understanding and has no additional questions.

## 2018-07-30 ENCOUNTER — Ambulatory Visit (INDEPENDENT_AMBULATORY_CARE_PROVIDER_SITE_OTHER): Payer: Medicaid Other | Admitting: *Deleted

## 2018-07-30 ENCOUNTER — Other Ambulatory Visit: Payer: Self-pay

## 2018-07-30 DIAGNOSIS — I5022 Chronic systolic (congestive) heart failure: Secondary | ICD-10-CM

## 2018-07-30 DIAGNOSIS — Z95810 Presence of automatic (implantable) cardiac defibrillator: Secondary | ICD-10-CM | POA: Diagnosis not present

## 2018-07-30 LAB — CUP PACEART INCLINIC DEVICE CHECK
Battery Remaining Longevity: 137 mo
Battery Voltage: 3.15 V
Brady Statistic RV Percent Paced: 0.01 %
Date Time Interrogation Session: 20200520111420
HighPow Impedance: 61 Ohm
Implantable Lead Implant Date: 20200508
Implantable Lead Location: 753860
Implantable Lead Model: 6935
Implantable Pulse Generator Implant Date: 20200508
Lead Channel Impedance Value: 380 Ohm
Lead Channel Impedance Value: 456 Ohm
Lead Channel Pacing Threshold Amplitude: 0.625 V
Lead Channel Pacing Threshold Pulse Width: 0.4 ms
Lead Channel Sensing Intrinsic Amplitude: 17.625 mV
Lead Channel Setting Pacing Amplitude: 3.5 V
Lead Channel Setting Pacing Pulse Width: 0.4 ms
Lead Channel Setting Sensing Sensitivity: 0.3 mV

## 2018-07-30 NOTE — Progress Notes (Signed)
Wound check appointment. Steri-strips removed. Wound without redness or edema. Incision edges approximated, wound well healed. Normal device function. Thresholds, sensing, and impedances consistent with implant measurements. Device programmed at 3.5V for extra safety margin until 3 month visit. Histogram distribution appropriate for patient and level of activity. No AF episodes and no ventricular arrhythmias noted. Patient educated about wound care, arm mobility, lifting restrictions, shock plan. ROV in 3 months with implanting physician.

## 2018-08-08 ENCOUNTER — Ambulatory Visit: Payer: Self-pay

## 2018-08-11 ENCOUNTER — Other Ambulatory Visit: Payer: Self-pay | Admitting: Internal Medicine

## 2018-08-11 MED ORDER — LISINOPRIL 10 MG PO TABS: 10.0000 mg | ORAL_TABLET | Freq: Every day | ORAL | 3 refills | Status: DC

## 2018-08-11 NOTE — Telephone Encounter (Signed)
Needs refill on   lisinopril (PRINIVIL,ZESTRIL) 10 MG tablet  [facility] - [place], [place] - [address]   ;pt contact [phone]

## 2018-08-13 ENCOUNTER — Encounter: Payer: Self-pay | Admitting: *Deleted

## 2018-08-15 ENCOUNTER — Ambulatory Visit: Payer: Self-pay | Admitting: Cardiovascular Disease

## 2018-08-27 ENCOUNTER — Encounter: Payer: Medicaid Other | Admitting: Internal Medicine

## 2018-08-30 ENCOUNTER — Encounter: Payer: Self-pay | Admitting: *Deleted

## 2018-09-03 ENCOUNTER — Encounter: Payer: Self-pay | Admitting: Internal Medicine

## 2018-09-03 ENCOUNTER — Other Ambulatory Visit: Payer: Self-pay

## 2018-09-03 ENCOUNTER — Ambulatory Visit (INDEPENDENT_AMBULATORY_CARE_PROVIDER_SITE_OTHER): Payer: Medicaid Other | Admitting: Internal Medicine

## 2018-09-03 VITALS — BP 97/67 | HR 74 | Temp 98.1°F | Ht 69.0 in | Wt 128.3 lb

## 2018-09-03 DIAGNOSIS — Z79899 Other long term (current) drug therapy: Secondary | ICD-10-CM | POA: Diagnosis not present

## 2018-09-03 DIAGNOSIS — F1010 Alcohol abuse, uncomplicated: Secondary | ICD-10-CM

## 2018-09-03 DIAGNOSIS — I428 Other cardiomyopathies: Secondary | ICD-10-CM

## 2018-09-03 DIAGNOSIS — Z720 Tobacco use: Secondary | ICD-10-CM

## 2018-09-03 DIAGNOSIS — I426 Alcoholic cardiomyopathy: Secondary | ICD-10-CM | POA: Diagnosis not present

## 2018-09-03 DIAGNOSIS — Z0000 Encounter for general adult medical examination without abnormal findings: Secondary | ICD-10-CM

## 2018-09-03 DIAGNOSIS — I10 Essential (primary) hypertension: Secondary | ICD-10-CM

## 2018-09-03 DIAGNOSIS — Z95810 Presence of automatic (implantable) cardiac defibrillator: Secondary | ICD-10-CM

## 2018-09-03 NOTE — Assessment & Plan Note (Signed)
Slightly hypotensive today but asymptomatic. Denies dizziness, lightheadedness, syncope. Continue current medications. Advised patient to stay hydrated and return if symptoms occur.   - continue spiro 25mg  qd, coreg 12.5 bid, lisinopril 10mg  qd, lasix 20mg  qd

## 2018-09-03 NOTE — Assessment & Plan Note (Signed)
counseled on cessation. Declined naltrexone. Drinks 2-3 beers/day

## 2018-09-03 NOTE — Patient Instructions (Signed)
It was nice seeing you today. Thank you for choosing [facility] for your Primary Care.   I sent in a GI referral for your colonoscopy. Someone will contact you about this appointment.   I also ordered iron studies to be performed in the future. You can get them done when you see your heart doctor or you can come back to our clinic at your convenience and have the blood work drawn.   Talk to your heart doctor about starting Naltrexone to help reduce your cravings for alcohol    FOLLOW-UP INSTRUCTIONS When: 1 year For: heart failure, blood pressure What to bring: all medications   Please contact the clinic if you have any problems, or need to be seen sooner.    Naltrexone tablets What is this medicine? NALTREXONE (nal TREX one) helps you to remain free of your dependence on opiate drugs or alcohol. It blocks the 'high' that these substances can give you. This medicine is combined with counseling and support groups. This medicine may be used for other purposes; ask your health care provider or pharmacist if you have questions. COMMON BRAND NAME(S): Depade, ReVia What should I tell my health care provider before I take this medicine? They need to know if you have any of these conditions: -if you have used drugs or alcohol within 7 to 10 days -kidney disease -liver disease, including hepatitis -an unusual or allergic reaction to naltrexone, other medicines, foods, dyes, or preservatives -pregnant or trying to get pregnant -breast-feeding How should I use this medicine? Take this medicine by mouth with a full glass of water. Follow the directions on the prescription label. Do not take this medicine within 7 to 10 days of taking any opioid drugs. Take your medicine at regular intervals. Do not take your medicine more often than directed. Do not stop taking except on your doctor's advice. Talk to your pediatrician regarding the use of this medicine in children. Special care may be  needed. Overdosage: If you think you have taken too much of this medicine contact a poison control center or emergency room at once. NOTE: This medicine is only for you. Do not share this medicine with others. What if I miss a dose? If you miss a dose and remember on the same day, take the missed dose. If you do not remember until the next day, ask your doctor or health care professional about rescheduling your doses. Do not take double or extra doses. What may interact with this medicine? Do not take this medicine with any of the following medications: -any prescription or street opioid drug like codiene, heroin, methadone This medicine may also interact with the following medications: -disulfiram -thioridazine This list may not describe all possible interactions. Give your health care provider a list of all the medicines, herbs, non-prescription drugs, or dietary supplements you use. Also tell them if you smoke, drink alcohol, or use illegal drugs. Some items may interact with your medicine. What should I watch for while using this medicine? Your condition will be monitored carefully while you are receiving this medicine. Visit your doctor or health care professional regularly. For this medicine to be most effective you should attend any counseling or support groups that your doctor or health care professional recommends. Do not try to overcome the effects of the medicine by taking large amounts of narcotics or by drinking large amounts of alcohol. This can cause severe problems including death. Also, you may be more sensitive to lower doses of  narcotics after you stop taking this medicine. If you are going to have surgery, tell your doctor or health care professional that you are taking this medicine. Do not treat yourself for coughs, colds, pain, or diarrhea. Ask your doctor or health care professional for advice. Some of the ingredients may interact with this medicine and cause side  effects. Wear a medical ID bracelet or chain, and carry a card that describes your disease and details of your medicine and dosage times. You may get drowsy or dizzy. Do not drive, use machinery, or do anything that needs mental alertness until you know how this medicine affects you. Do not stand or sit up quickly, especially if you are an older patient. This reduces the risk of dizzy or fainting spells. Alcohol may interfere with the effect of this medicine. Avoid alcoholic drinks. What side effects may I notice from receiving this medicine? Side effects that you should report to your doctor or health care professional as soon as possible: -allergic reactions like skin rash, itching or hives, swelling of the face, lips, or tongue -breathing problems -changes in vision, hearing -confusion -dark urine -depressed mood -diarrhea -fast or irregular heart beat -hallucination, loss of contact with reality -light-colored stools -right upper belly pain -suicidal thoughts or other mood changes -unusually weak or tired -vomiting -yellowing of the eyes or skin Side effects that usually do not require medical attention (report to your doctor or health care professional if they continue or are bothersome): -aches, pains -change in sex drive or performance -feeling anxious -headache -loss of appetite, nausea -runny nose, sinus problems, sneezing -stomach pain -trouble sleeping This list may not describe all possible side effects. Call your doctor for medical advice about side effects. You may report side effects to FDA at 1-800-FDA-1088. Where should I keep my medicine? Keep out of the reach of children. Store at room temperature between 20 and 25 degrees C (68 and 77 degrees F). Throw away any unused medicine after the expiration date. NOTE: This sheet is a summary. It may not cover all possible information. If you have questions about this medicine, talk to your doctor, pharmacist, or health  care provider.  2019 Elsevier/Gold Standard (2011-12-20 10:33:18)

## 2018-09-03 NOTE — Assessment & Plan Note (Signed)
Over due for colonoscopy. Declined tetanus shot.   - GI referral placed

## 2018-09-03 NOTE — Assessment & Plan Note (Signed)
Counseled on cessation. Declined nicotine replacement therapy. Smokes 2-3 cigarettes/day. Denies sob, cough, hemoptysis, weight loss, decreased appetite.

## 2018-09-03 NOTE — Assessment & Plan Note (Signed)
ICD inserted one month ago. Euvolemic. Reports good exercise tolerance. Denies chest pain, sob, palpitations. Continues drinking alcohol 2-3 beers/day. Advised on cessation and offered naltrexone but patient declined at this time. Blood work reviewed and noted new slight normocytic anemia. Given heart failure, work up for iron deficiency could be beneficial. He would like to perform these labs at a later date  - future order placed for ferritin, iron/IBC. Can get these drawn at cardiology appt in August or come back to [facility] for lab only visit  - continue spiro 25mg  qd, coreg 12.5 bid, lisinopril 10mg  qd, lasix 20mg  qd

## 2018-09-03 NOTE — Progress Notes (Addendum)
   CC: heart failure, colon cancer screening   HPI:  [name] is a 63 y.o. M with non-ischemic alcoholic cardiomyopathy s/p ICD insertion who presents for routine follow up.   Please see encounter tab for full details of HPI.   Past Medical History:  Diagnosis Date  . Acute otitis media 04/01/2015  . Alcohol abuse   . Chronic combined systolic and diastolic CHF (congestive heart failure) ([place])    a. 2D Echo 02/22/15 showed EF 20-25%, multiple WMA, grade 2 DD, aortic sclerosis, mild MR, severely dilated LA, cannot exclude PFO.  Marland Kitchen Cough 04/01/2015  . Epigastric pain   . Essential hypertension   . Leukopenia    a. Noted on labs, unclear what prior w/u.  Marland Kitchen NICM (nonischemic cardiomyopathy) ([place])    a. LHC 1/22016: normal cors.  . Non-ischemic cardiomyopathy ([place]) 03/03/2015  . Pulmonary hypertension ([place])    a. moderate pulm HTN by cath 02/2015.  Marland Kitchen Syncope 07/08/2013   syncope   . Tobacco abuse      Physical Exam:  Vitals:   09/03/18 1458  BP: 97/67  Pulse: 74  Temp: 98.1 F (36.7 C)  TempSrc: Oral  SpO2: 100%  Weight: 128 lb 4.8 oz (58.2 kg)  Height: 5\' 9"  (1.753 m)   Gen: well appearing, thin AA male in NAD Cardiac: RRR, no m/r/g, laterally displaced PMI PUlm: CTAB Ext: no LEE  Assessment & Plan:   See Encounters Tab for problem based charting.  Patient discussed with Dr. Angelia Brady

## 2018-09-08 NOTE — Progress Notes (Signed)
Internal Medicine Clinic Attending  Case discussed with Dr. Vogel  at the time of the visit.  We reviewed the resident's history and exam and pertinent patient test results.  I agree with the assessment, diagnosis, and plan of care documented in the resident's note.  

## 2018-09-10 NOTE — Addendum Note (Signed)
Addended by: Hulan Fray on: 09/10/2018 03:44 PM   Modules accepted: Orders

## 2018-09-22 ENCOUNTER — Other Ambulatory Visit: Payer: Self-pay | Admitting: Cardiovascular Disease

## 2018-09-24 ENCOUNTER — Telehealth: Payer: Self-pay | Admitting: Internal Medicine

## 2018-09-24 NOTE — Telephone Encounter (Signed)
Spoke w/ the patient, informed him that we received his transmission. No events or episodes. Answered all questions.

## 2018-09-24 NOTE — Telephone Encounter (Signed)
Pt calling to let us know he felt weak with blurred vision while riding his scooter in the sun yesterday. He said he got off the scooter and rested then felt better.  I advised him to send in a manual transmission to be sure he was having no heart dysrhythmia during that time. I will forward to device clinic as he will need assistance in sending in his check.  Pt understands we will contact him back when his manual transmission has been reviewed.

## 2018-09-24 NOTE — Telephone Encounter (Signed)
I help the pt send a manual transmission with his home monitor. Transmission received 09-24-2018. I told him the nurse will take a look at it and give him a call back. Pt verbalized understanding.

## 2018-09-24 NOTE — Telephone Encounter (Signed)
New Message    Pt c/o Syncope: STAT if syncope occurred within 30 minutes and pt complains of lightheadedness High Priority if episode of passing out, completely, today or in last 24 hours   1. Did you pass out today? No last time it happened was yesterday.  2. When is the last time you passed out? Yesterday   3. Has this occurred multiple times? Yes   4. Did you have any symptoms prior to passing out? Patient get's dizzy and has blurred vision.

## 2018-10-27 ENCOUNTER — Encounter: Payer: Self-pay | Admitting: Internal Medicine

## 2018-10-27 ENCOUNTER — Ambulatory Visit (INDEPENDENT_AMBULATORY_CARE_PROVIDER_SITE_OTHER): Payer: Medicaid Other | Admitting: Internal Medicine

## 2018-10-27 ENCOUNTER — Ambulatory Visit (INDEPENDENT_AMBULATORY_CARE_PROVIDER_SITE_OTHER): Payer: Self-pay | Admitting: *Deleted

## 2018-10-27 ENCOUNTER — Other Ambulatory Visit: Payer: Self-pay

## 2018-10-27 VITALS — BP 100/72 | HR 72 | Ht 69.0 in | Wt 127.0 lb

## 2018-10-27 DIAGNOSIS — I428 Other cardiomyopathies: Secondary | ICD-10-CM | POA: Diagnosis not present

## 2018-10-27 DIAGNOSIS — I5022 Chronic systolic (congestive) heart failure: Secondary | ICD-10-CM | POA: Diagnosis not present

## 2018-10-27 DIAGNOSIS — Z79899 Other long term (current) drug therapy: Secondary | ICD-10-CM

## 2018-10-27 DIAGNOSIS — I272 Pulmonary hypertension, unspecified: Secondary | ICD-10-CM

## 2018-10-27 DIAGNOSIS — Z95810 Presence of automatic (implantable) cardiac defibrillator: Secondary | ICD-10-CM

## 2018-10-27 LAB — CUP PACEART INCLINIC DEVICE CHECK
Battery Remaining Longevity: 136 mo
Battery Voltage: 3.13 V
Brady Statistic RV Percent Paced: 0.01 %
Date Time Interrogation Session: 20200817170627
HighPow Impedance: 69 Ohm
Implantable Lead Implant Date: 20200508
Implantable Lead Location: 753860
Implantable Lead Model: 6935
Implantable Pulse Generator Implant Date: 20200508
Lead Channel Impedance Value: 437 Ohm
Lead Channel Impedance Value: 513 Ohm
Lead Channel Pacing Threshold Amplitude: 0.75 V
Lead Channel Pacing Threshold Pulse Width: 0.4 ms
Lead Channel Sensing Intrinsic Amplitude: 13.875 mV
Lead Channel Setting Pacing Amplitude: 2.5 V
Lead Channel Setting Pacing Pulse Width: 0.4 ms
Lead Channel Setting Sensing Sensitivity: 0.3 mV

## 2018-10-27 MED ORDER — LISINOPRIL 10 MG PO TABS: 10.0000 mg | ORAL_TABLET | Freq: Every day | ORAL | 3 refills | Status: DC

## 2018-10-27 MED ORDER — CARVEDILOL 12.5 MG PO TABS: ORAL_TABLET | ORAL | 3 refills | Status: DC

## 2018-10-27 MED ORDER — SPIRONOLACTONE 25 MG PO TABS: 25.0000 mg | ORAL_TABLET | Freq: Every day | ORAL | 3 refills | Status: DC

## 2018-10-27 NOTE — Progress Notes (Signed)
Patient Care Team: Marianna Payment, MD as PCP - General Deboraha Sprang, MD as PCP - Electrophysiology (Cardiology)   HPI  Darin Brady is a 63 y.o. male Seen for NICM s/p ICD [employer]  5/20 for primary prevention.  The patient denies chest pain, shortness of breath, nocturnal dyspnea, orthopnea or peripheral edema.  There have been no palpitations, lightheadedness or syncope.   Continues to drink, 2 x 16 ounces beer ( admitted)   Episodes of lightheadedness a few hours after taking his medication associated with presyncope.  DATE TEST EF   12/16 Echo 20-25%   12/16 LHC 15% Cors Normal  1/20 Echo 20% LAE ( 45/2.6/35)  3/20 Echo  15-20%  No thickening    Date Cr K Hgb   2/20 1.10 4.3             Records and Results Reviewed   Past Medical History:  Diagnosis Date   Acute otitis media 04/01/2015   Alcohol abuse    Chronic combined systolic and diastolic CHF (congestive heart failure) ([place])    a. 2D Echo 02/22/15 showed EF 20-25%, multiple WMA, grade 2 DD, aortic sclerosis, mild MR, severely dilated LA, cannot exclude PFO.   Cough 04/01/2015   Epigastric pain    Essential hypertension    Leukopenia    a. Noted on labs, unclear what prior w/u.   NICM (nonischemic cardiomyopathy) ([place])    a. LHC 1/22016: normal cors.   Non-ischemic cardiomyopathy ([place]) 03/03/2015   Pulmonary hypertension ([facility])    a. moderate pulm HTN by cath 02/2015.   Syncope 07/08/2013   syncope    Tobacco abuse     Past Surgical History:  Procedure Laterality Date   CARDIAC CATHETERIZATION N/A 02/24/2015   Procedure: Right/Left Heart Cath and Coronary Angiography;  Surgeon: Peter M Martinique, MD;  Location: [facility];  Service: Cardiovascular;  Laterality: N/A;   CLAVICLE SURGERY     15 years ago   ICD IMPLANT N/A 07/18/2018   Procedure: ICD IMPLANT;  Surgeon: Constance Haw, MD;  Location: [place] [facility];  Service: Cardiovascular;   Laterality: N/A;   ORIF ANKLE FRACTURE  02/03/2011   Procedure: OPEN REDUCTION INTERNAL FIXATION (ORIF) ANKLE FRACTURE;  Surgeon: Yvette Rack., MD;  Location: [facility];  Service: Orthopedics;  Laterality: Left;   STOMACH SURGERY     stop bleeding from a trauma    Current Meds  Medication Sig   carvedilol (COREG) 12.5 MG tablet Take 1 tablet (12.5 mg total) by mouth 2 (two) times daily with a meal.   furosemide (LASIX) 20 MG tablet TAKE 20MG  DAILY AND AN EXTRA 20MG  AS NEEDED FOR FLUID RETENTION   lisinopril (ZESTRIL) 10 MG tablet Take 1 tablet (10 mg total) by mouth daily.   spironolactone (ALDACTONE) 25 MG tablet Take 1 tablet (25 mg total) by mouth daily.    No Known Allergies    Review of Systems negative except from HPI and PMH  Physical Exam   BP 100/72    Pulse 72    Ht 5\' 9"  (1.753 m)    Wt 127 lb (57.6 kg)    SpO2 99%    BMI 18.75 kg/m   Well developed and well nourished in no acute distress HENT normal Neck supple with JVP-flat Clear Device pocket well healed; without hematoma or erythema.  There is no tethering  Regular rate and rhythm, no  gallop No murmur Abd-soft with active  BS No Clubbing cyanosis  edema Skin-warm and dry A & Oriented  Grossly normal sensory and motor function  ECG @ 72 18/10/38Low volts    Assessment and  Plan  Cardiomyopathy presumed nonischemic  Congestive heart failure-class II-chronic-systolic  PVCs  Alcohol  Lightheadedness and presyncope//BP- low   With daytime dizziness and presyncope will have him take lisinopril and aldactone at night and decrease his am carvedilol 12.5>>6.25   This is major risk  He will let us know how the above changes impact  Long discussion to encourage ongoing restraint of alcohol intake   Current medicines are reviewed at length with the patient today .  The patient does not  have concerns regarding medicines.

## 2018-10-27 NOTE — Patient Instructions (Signed)
Medication Instructions:  Your physician has recommended you make the following change in your medication:   1. Begin taking your lisinopril and spironolactone before bedtime 2. Decrease you carvedilol to 6.25mg , half tablet, in the morning. Continue taking 12.5mg , whole tablet, in the evening.   Labwork: None ordered.   Testing/Procedures: None ordered.  Follow-Up: Your physician recommends that you schedule a follow-up appointment in:   Dr. Gwenlyn Found in 3 months  Dr. Caryl Comes in 9 months.   Any Other Special Instructions Will Be Listed Below (If Applicable).     If you need a refill on your cardiac medications before your next appointment, please call your pharmacy.

## 2018-10-28 LAB — CUP PACEART REMOTE DEVICE CHECK
Battery Remaining Longevity: 136 mo
Battery Voltage: 3.11 V
Brady Statistic RV Percent Paced: 0.01 %
Date Time Interrogation Session: 20200818054227
HighPow Impedance: 64 Ohm
Implantable Lead Implant Date: 20200508
Implantable Lead Location: 753860
Implantable Lead Model: 6935
Implantable Pulse Generator Implant Date: 20200508
Lead Channel Impedance Value: 399 Ohm
Lead Channel Impedance Value: 513 Ohm
Lead Channel Pacing Threshold Amplitude: 0.5 V
Lead Channel Pacing Threshold Pulse Width: 0.4 ms
Lead Channel Sensing Intrinsic Amplitude: 12.125 mV
Lead Channel Sensing Intrinsic Amplitude: 13.875 mV
Lead Channel Setting Pacing Amplitude: 2.5 V
Lead Channel Setting Pacing Pulse Width: 0.4 ms
Lead Channel Setting Sensing Sensitivity: 0.3 mV

## 2018-11-04 ENCOUNTER — Encounter: Payer: Self-pay | Admitting: Cardiology

## 2018-11-04 NOTE — Addendum Note (Signed)
Addended by: Tiajuana Amass on: 11/04/2018 12:00 PM   Modules accepted: Level of Service

## 2018-11-04 NOTE — Progress Notes (Signed)
Remote ICD transmission.   

## 2019-01-14 ENCOUNTER — Ambulatory Visit: Payer: Medicaid Other | Admitting: Cardiovascular Disease

## 2019-01-26 ENCOUNTER — Ambulatory Visit (INDEPENDENT_AMBULATORY_CARE_PROVIDER_SITE_OTHER): Payer: Medicaid Other | Admitting: *Deleted

## 2019-01-26 DIAGNOSIS — I428 Other cardiomyopathies: Secondary | ICD-10-CM

## 2019-01-26 DIAGNOSIS — I5022 Chronic systolic (congestive) heart failure: Secondary | ICD-10-CM

## 2019-01-27 LAB — CUP PACEART REMOTE DEVICE CHECK
Battery Remaining Longevity: 135 mo
Battery Voltage: 3.09 V
Brady Statistic RV Percent Paced: 0.01 %
Date Time Interrogation Session: 20201116093824
HighPow Impedance: 69 Ohm
Implantable Lead Implant Date: 20200508
Implantable Lead Location: 753860
Implantable Lead Model: 6935
Implantable Pulse Generator Implant Date: 20200508
Lead Channel Impedance Value: 380 Ohm
Lead Channel Impedance Value: 494 Ohm
Lead Channel Pacing Threshold Amplitude: 0.5 V
Lead Channel Pacing Threshold Pulse Width: 0.4 ms
Lead Channel Sensing Intrinsic Amplitude: 13.25 mV
Lead Channel Sensing Intrinsic Amplitude: 13.25 mV
Lead Channel Setting Pacing Amplitude: 2.5 V
Lead Channel Setting Pacing Pulse Width: 0.4 ms
Lead Channel Setting Sensing Sensitivity: 0.3 mV

## 2019-02-04 ENCOUNTER — Ambulatory Visit: Payer: Medicaid Other | Admitting: Cardiovascular Disease

## 2019-02-04 ENCOUNTER — Other Ambulatory Visit: Payer: Self-pay

## 2019-02-19 NOTE — Progress Notes (Signed)
Remote ICD transmission.   

## 2019-03-10 ENCOUNTER — Other Ambulatory Visit: Payer: Self-pay | Admitting: Cardiovascular Disease

## 2019-04-27 ENCOUNTER — Ambulatory Visit (INDEPENDENT_AMBULATORY_CARE_PROVIDER_SITE_OTHER): Payer: Medicare Other | Admitting: *Deleted

## 2019-04-27 DIAGNOSIS — I428 Other cardiomyopathies: Secondary | ICD-10-CM | POA: Diagnosis not present

## 2019-04-28 LAB — CUP PACEART REMOTE DEVICE CHECK
Battery Remaining Longevity: 134 mo
Battery Voltage: 3.06 V
Brady Statistic RV Percent Paced: 0.01 %
Date Time Interrogation Session: 20210215202604
HighPow Impedance: 72 Ohm
Implantable Lead Implant Date: 20200508
Implantable Lead Location: 753860
Implantable Lead Model: 6935
Implantable Pulse Generator Implant Date: 20200508
Lead Channel Impedance Value: 437 Ohm
Lead Channel Impedance Value: 494 Ohm
Lead Channel Pacing Threshold Amplitude: 0.5 V
Lead Channel Pacing Threshold Pulse Width: 0.4 ms
Lead Channel Sensing Intrinsic Amplitude: 11.75 mV
Lead Channel Sensing Intrinsic Amplitude: 11.75 mV
Lead Channel Setting Pacing Amplitude: 2.5 V
Lead Channel Setting Pacing Pulse Width: 0.4 ms
Lead Channel Setting Sensing Sensitivity: 0.3 mV

## 2019-04-28 NOTE — Progress Notes (Signed)
ICD Remote  

## 2019-05-28 ENCOUNTER — Telehealth: Payer: Medicare Other | Admitting: Cardiovascular Disease

## 2019-05-28 NOTE — Progress Notes (Unsigned)
{Choose 1 Note Type (Video or Telephone):[phone]}   The patient was identified using 2 identifiers.  Date:  05/28/2019   ID:  Darin Brady, DOB 02/21/1956, MRN [id number]  Patient Location: Home Provider Location: Home  PCP:  Marianna Payment, MD  Cardiologist: Dr. Quay Burow Electrophysiologist:  Virl Axe, MD   Evaluation Performed:  Follow-Up Visit  Chief Complaint: Nonischemic cardiomyopathy  History of Present Illness:    Darin Brady is a 64 y.o. thin appearing single [place] American male with no children referred for evaluation of syncope.I last spoke to him for a virtual telemedicine phone visit on 06/20/2018. He has not worked for 8 years. His risk factors include 40 pack years of tobacco abuse currently smoking one pack per day as well as 2 hypertension. He is a 59 year old brother died of a massive myocardial infarction. He drinks 2-3 beers a day 4 days a week. He's had episodes of syncope, one in March and one in April. There were sudden onset without seizure activity.  He had right and left heart cath performed by Dr. Martinique 02/24/2015 revealing normal coronary arteries, EF of 50% with moderate pulmonary hypertension. He continues to smoke 2 cigarettes a day and drinks 3-4 beers a day. He does have most likely class III symptoms was severe shortness of breath on exertion. Has not taken any cardiac meds for the last 3 years. He denies chest pain.  Since I saw him a month ago I have referred him to Dr. Jolyn Nap for consideration of ICD implantation for primary prevention.  He had a Holter monitor performed 04/30/2018 that showed PVCs.  There were no arrhythmias noted.  When I initially saw him in January he had class III heart failure and an ultrasound performed 03/21/2018 revealed LV dilatation with an EF of 15 to 20%.  I did begin him on heart failure medications and when he saw Dr. Caryl Comes back in the office 04/14/2018 his symptoms had significantly  improved.  I saw him back on 05/13/2018 at that time his heart failure symptoms were class I or II.  He is aware of the importance of salt avoidance.  I had sent him to Dr. Caryl Comes for consideration of ICD implantation.  Repeat 2D echo after 3 months of medical therapy performed 06/10/2018 revealed continued severe LV dysfunction with an EF of 15 to 20% and decreased LV dimensions with mild to moderate MR.  He does continues to drink several beers a day but has cut down on his cigarettes from a pack a day down to 3 cigarettes a day.  The patient {does/does not:200015} have symptoms concerning for COVID-19 infection (fever, chills, cough, or new shortness of breath).    Past Medical History:  Diagnosis Date  . Acute otitis media 04/01/2015  . Alcohol abuse   . Chronic combined systolic and diastolic CHF (congestive heart failure) ([place])    a. 2D Echo 02/22/15 showed EF 20-25%, multiple WMA, grade 2 DD, aortic sclerosis, mild MR, severely dilated LA, cannot exclude PFO.  Marland Kitchen Cough 04/01/2015  . Epigastric pain   . Essential hypertension   . Leukopenia    a. Noted on labs, unclear what prior w/u.  Marland Kitchen NICM (nonischemic cardiomyopathy) ([place])    a. LHC 1/22016: normal cors.  . Non-ischemic cardiomyopathy ([place]) 03/03/2015  . Pulmonary hypertension ([place])    a. moderate pulm HTN by cath 02/2015.  Marland Kitchen Syncope 07/08/2013   syncope   . Tobacco abuse    Past Surgical History:  Procedure Laterality  Date  . CARDIAC CATHETERIZATION N/A 02/24/2015   Procedure: Right/Left Heart Cath and Coronary Angiography;  Surgeon: Peter M Swaziland, MD;  Location: [facility] INVASIVE [facility];  Service: Cardiovascular;  Laterality: N/A;  . CLAVICLE SURGERY     15 years ago  . ICD IMPLANT N/A 07/18/2018   Procedure: ICD IMPLANT;  Surgeon: Regan Lemming, MD;  Location: [facility] INVASIVE [facility];  Service: Cardiovascular;  Laterality: N/A;  . ORIF ANKLE FRACTURE  02/03/2011   Procedure: OPEN REDUCTION INTERNAL FIXATION (ORIF) ANKLE FRACTURE;   Surgeon: Thera Flake., MD;  Location: [facility];  Service: Orthopedics;  Laterality: Left;  . STOMACH SURGERY     stop bleeding from a trauma     No outpatient medications have been marked as taking for the 05/28/19 encounter (Appointment) with Runell Gess, MD.     Allergies:   Patient has no known allergies.   Social History   Tobacco Use  . Smoking status: Current Every Day Smoker    Packs/day: 0.20    Last attempt to quit: 03/24/2015    Years since quitting: 4.1  . Smokeless tobacco: Never Used  . Tobacco comment: 3-4 CIAGARETTES A DAY  Substance Use Topics  . Alcohol use: Yes    Alcohol/week: 0.0 standard drinks    Comment: 1-2 sixteen ounce beers daily.  . Drug use: No     Family Hx: The patient's family history includes Asthma in his mother; Heart attack in his brother; Hypertension in his brother and mother. There is no history of Stroke.  ROS:   Please see the history of present illness.    *** All other systems reviewed and are negative.   Prior CV studies:   The following studies were reviewed today:  ***  Labs/Other Tests and Data Reviewed:    EKG:  {EKG/Telemetry Strips Reviewed:[phone]}  Recent Labs: 07/18/2018: BUN 21; Creatinine, Ser 0.89; Hemoglobin 11.3; Platelets 222; Potassium 3.9; Sodium 137   Recent Lipid Panel Lab Results  Component Value Date/Time   CHOL 103 03/24/2018 08:55 AM   TRIG 77 03/24/2018 08:55 AM   HDL 38 (L) 03/24/2018 08:55 AM   CHOLHDL 2.7 03/24/2018 08:55 AM   CHOLHDL 3.4 02/22/2015 03:40 AM   LDLCALC 50 03/24/2018 08:55 AM    Wt Readings from Last 3 Encounters:  10/27/18 127 lb (57.6 kg)  09/03/18 128 lb 4.8 oz (58.2 kg)  07/18/18 139 lb (63 kg)     Objective:    Vital Signs:  There were no vitals taken for this visit.   {HeartCare Virtual Exam (Optional):[phone]::"VITAL SIGNS:  reviewed"}  ASSESSMENT & PLAN:    1. ***  COVID-19 Education: The signs and symptoms of COVID-19 were discussed with  the patient and how to seek care for testing (follow up with PCP or arrange E-visit).  ***The importance of social distancing was discussed today.  Time:   Today, I have spent *** minutes with the patient with telehealth technology discussing the above problems.     Medication Adjustments/Labs and Tests Ordered: Current medicines are reviewed at length with the patient today.  Concerns regarding medicines are outlined above.   Tests Ordered: No orders of the defined types were placed in this encounter.   Medication Changes: No orders of the defined types were placed in this encounter.   Follow Up:  {F/U Format:[phone]} {follow up:15908}  Signed, Nanetta Batty, MD  05/28/2019 11:41 AM    [facility]

## 2019-07-01 ENCOUNTER — Ambulatory Visit: Payer: Medicare Other | Admitting: Cardiovascular Disease

## 2019-07-14 ENCOUNTER — Encounter: Payer: Self-pay | Admitting: Cardiovascular Disease

## 2019-07-14 ENCOUNTER — Other Ambulatory Visit: Payer: Self-pay

## 2019-07-14 ENCOUNTER — Ambulatory Visit (INDEPENDENT_AMBULATORY_CARE_PROVIDER_SITE_OTHER): Payer: Medicare Other | Admitting: Cardiovascular Disease

## 2019-07-14 VITALS — BP 102/74 | HR 81 | Ht 69.0 in | Wt 128.2 lb

## 2019-07-14 DIAGNOSIS — I10 Essential (primary) hypertension: Secondary | ICD-10-CM

## 2019-07-14 DIAGNOSIS — I428 Other cardiomyopathies: Secondary | ICD-10-CM | POA: Diagnosis not present

## 2019-07-14 DIAGNOSIS — Z720 Tobacco use: Secondary | ICD-10-CM | POA: Diagnosis not present

## 2019-07-14 NOTE — Patient Instructions (Signed)
Medication Instructions:  NO CHANGE *If you need a refill on your cardiac medications before your next appointment, please call your pharmacy*   Lab Work: If you have labs (blood work) drawn today and your tests are completely normal, you will receive your results only by: Marland Kitchen MyChart Message (if you have MyChart) OR . A paper copy in the mail If you have any lab test that is abnormal or we need to change your treatment, we will call you to review the results.   Testing/Procedures: Your physician has requested that you have an echocardiogram. Echocardiography is a painless test that uses sound waves to create images of your heart. It provides your doctor with information about the size and shape of your heart and how well your heart's chambers and valves are working. This procedure takes approximately one hour. There are no restrictions for this procedure.[address]     Follow-Up: At [facility], you and your health needs are our priority.  As part of our continuing mission to provide you with exceptional heart care, we have created designated Provider Care Teams.  These Care Teams include your primary Cardiologist (physician) and Advanced Practice Providers (APPs -  Physician Assistants and Nurse Practitioners) who all work together to provide you with the care you need, when you need it.  We recommend signing up for the patient portal called "MyChart".  Sign up information is provided on this After Visit Summary.  MyChart is used to connect with patients for Virtual Visits (Telemedicine).  Patients are able to view lab/test results, encounter notes, upcoming appointments, etc.  Non-urgent messages can be sent to your provider as well.   To learn more about what you can do with MyChart, go to [email or web address].    Your next appointment:    Your physician wants you to follow-up in: 6 MONTHS WITH APP You will receive a reminder letter in the mail two months in advance. If  you don't receive a letter, please call our office to schedule the follow-up appointment.  Your physician wants you to follow-up in: 12 MONTHS WITH DR Allyson Sabal You will receive a reminder letter in the mail two months in advance. If you don't receive a letter, please call our office to schedule the follow-up appointment.

## 2019-07-14 NOTE — Assessment & Plan Note (Signed)
History of essential hypertension with blood pressure measured today at 102/74.  He is on carvedilol, lisinopril and spironolactone.

## 2019-07-14 NOTE — Assessment & Plan Note (Signed)
History of continued tobacco abuse of 10 cigarettes a day.

## 2019-07-14 NOTE — Assessment & Plan Note (Signed)
If you continue alcohol abuse of to 6 beers per day.

## 2019-07-14 NOTE — Assessment & Plan Note (Signed)
History of nonischemic cardiomyopathy with an EF of 15 to 20% by echo 06/10/2018.  He is on lisinopril, spironolactone and carvedilol.  We will recheck a 2D echocardiogram.

## 2019-07-14 NOTE — Progress Notes (Signed)
07/14/2019 Darin Brady   Jan 05, 1956  [id number]  Primary Physician Dellia Cloud, MD Primary Cardiologist: Runell Gess MD Nicholes Calamity, [place]  HPI:  Darin Brady is a 64 y.o.  thin appearing single African American male with no children referred for evaluation of syncope.I last  saw him in the office 05/28/2019. He has not worked for 8 years. His risk factors include 40 pack years of tobacco abuse currently smoking 5 cigarettes a day as well as hypertension. He is a 51 year old brother died of a massive myocardial infarction. He drinks 2-3 beers a day 4 days a week. He's had episodes of syncope, one in March and one in April. There were sudden onset without seizure activity.  He had right and left heart cath performed by Dr. Swaziland 02/24/2015 revealing normal coronary arteries, EF of 50% with moderate pulmonary hypertension. He continues to smoke 2 cigarettes a day and drinks 3-4 beers a day. He does have most likely class III symptoms was severe shortness of breath on exertion. Has not taken any cardiac meds for the last 3 years. He denies chest pain.  Since I saw him a month ago I have referred him to Dr. Berton Mount for consideration of ICD implantation for primary prevention.He had a Holter monitor performed 04/30/2018 that showed PVCs. There were no arrhythmias noted.  When I initially saw him in January he had class III heart failure and an ultrasound performed 03/21/2018 revealed LV dilatation with an EF of 15 to 20%. I did begin him on heart failure medications and when he saw Dr. Graciela Husbands back in the office 04/14/2018 his symptoms had significantly improved. I saw him back on 05/13/2018 at that time his heart failure symptoms were class I or II.He is aware of the importance of salt avoidance. I had sent him to Dr. Graciela Husbands for consideration of ICD implantation. Repeat 2D echo after 3 months of medical therapy performed 06/10/2018 revealed continued severe LV  dysfunction with an EF of 15 to 20% and decreased LV dimensions with mild to moderate MR. He does continues to drink several beers a day but has cut down on his cigarettes from a pack a day down to 3 cigarettes a day.  He also had an ICD placed by Dr. Elberta Fortis for primary prevention 07/18/2018.  He has had no discharges.  At this point, he is in most class II heart failure.  His does not any salt.  He denies chest pain or shortness of breath.      Current Meds  Medication Sig  . carvedilol (COREG) 12.5 MG tablet Take 6.25mg , half tablet, in AM; Take 12.5mg , whole tablet, in PM  . furosemide (LASIX) 20 MG tablet TAKE 20MG  DAILY AND AN EXTRA 20MG  AS NEEDED FOR FLUID RETENTION  . lisinopril (ZESTRIL) 10 MG tablet Take 1 tablet (10 mg total) by mouth at bedtime.  spironolactone (ALDACTONE) 25 MG tablet Take 1 tablet (25 mg total) by mouth at bedtime.     No Known Allergies  Social History   Socioeconomic History  . Marital status: Single    Spouse name: Not on file  . Number of children: Not on file  . Years of education: Not on file  . Highest education level: Not on file  Occupational History  . Occupation:  Tobacco Use  . Smoking status: Current Every Day Smoker    Packs/day: 0.20    Last attempt to quit: 03/24/2015    Years since  quitting: 4.3  . Smokeless tobacco: Never Used  . Tobacco comment: 3-4 CIAGARETTES A DAY  Substance and Sexual Activity  . Alcohol use: Yes    Alcohol/week: 0.0 standard drinks    Comment: 1-2 sixteen ounce beers daily.  . Drug use: No  . Sexual activity: Yes  Other Topics Concern  . Not on file  Social History Narrative  . Not on file   Social Determinants of Health   Financial Resource Strain:   . Difficulty of Paying Living Expenses:   Food Insecurity:   . Worried About Charity fundraiser in the Last Year:   . Arboriculturist in the Last Year:   Transportation Needs:   . Film/video editor (Medical):   Marland Kitchen Lack of  Transportation (Non-Medical):   Physical Activity:   . Days of Exercise per Week:   . Minutes of Exercise per Session:   Stress:   . Feeling of Stress :   Social Connections:   . Frequency of Communication with Friends and Family:   . Frequency of Social Gatherings with Friends and Family:   . Attends Religious Services:   . Active Member of Clubs or Organizations:   . Attends Archivist Meetings:   Marland Kitchen Marital Status:   Intimate Partner Violence:   . Fear of Current or Ex-Partner:   . Emotionally Abused:   Marland Kitchen Physically Abused:   . Sexually Abused:      Review of Systems: General: negative for chills, fever, night sweats or weight changes.  Cardiovascular: negative for chest pain, dyspnea on exertion, edema, orthopnea, palpitations, paroxysmal nocturnal dyspnea or shortness of breath Dermatological: negative for rash Respiratory: negative for cough or wheezing Urologic: negative for hematuria Abdominal: negative for nausea, vomiting, diarrhea, bright red blood per rectum, melena, or hematemesis Neurologic: negative for visual changes, syncope, or dizziness All other systems reviewed and are otherwise negative except as noted above.    Blood pressure 102/74, pulse 81, height 5\' 9"  (1.753 m), weight 128 lb 3.2 oz (58.2 kg).  General appearance: alert and no distress Neck: no adenopathy, no carotid bruit, no JVD, supple, symmetrical, trachea midline and thyroid not enlarged, symmetric, no tenderness/mass/nodules Lungs: clear to auscultation bilaterally Heart: regular rate and rhythm, S1, S2 normal, no murmur, click, rub or gallop Extremities: extremities normal, atraumatic, no cyanosis or edema Pulses: 2+ and symmetric Skin: Skin color, texture, turgor normal. No rashes or lesions Neurologic: Alert and oriented X 3, normal strength and tone. Normal symmetric reflexes. Normal coordination and gait  EKG sinus rhythm 81 with low limb voltage, septal Q waves.  Personally  reviewed this EKG.  ASSESSMENT AND PLAN:   Essential hypertension History of essential hypertension with blood pressure measured today at 102/74.  He is on carvedilol, lisinopril and spironolactone.  Alcohol abuse If you continue alcohol abuse of to 6 beers per day.  Nonischemic cardiomyopathy ([place]) History of nonischemic cardiomyopathy with an EF of 15 to 20% by echo 06/10/2018.  He is on lisinopril, spironolactone and carvedilol.  We will recheck a 2D echocardiogram.  Tobacco abuse History of continued tobacco abuse of 10 cigarettes a day.      Lorretta Harp MD [place], [facility] 07/14/2019 4:38 PM

## 2019-07-27 ENCOUNTER — Ambulatory Visit (INDEPENDENT_AMBULATORY_CARE_PROVIDER_SITE_OTHER): Payer: Medicare Other | Admitting: *Deleted

## 2019-07-27 DIAGNOSIS — Z95810 Presence of automatic (implantable) cardiac defibrillator: Secondary | ICD-10-CM | POA: Insufficient documentation

## 2019-07-27 DIAGNOSIS — I428 Other cardiomyopathies: Secondary | ICD-10-CM | POA: Diagnosis not present

## 2019-07-27 LAB — CUP PACEART REMOTE DEVICE CHECK
Battery Remaining Longevity: 133 mo
Battery Voltage: 3.04 V
Brady Statistic RV Percent Paced: 0.01 %
Date Time Interrogation Session: 20210517043722
HighPow Impedance: 68 Ohm
Implantable Lead Implant Date: 20200508
Implantable Lead Location: 753860
Implantable Lead Model: 6935
Implantable Pulse Generator Implant Date: 20200508
Lead Channel Impedance Value: 380 Ohm
Lead Channel Impedance Value: 456 Ohm
Lead Channel Pacing Threshold Amplitude: 0.375 V
Lead Channel Pacing Threshold Pulse Width: 0.4 ms
Lead Channel Sensing Intrinsic Amplitude: 10.25 mV
Lead Channel Sensing Intrinsic Amplitude: 10.25 mV
Lead Channel Setting Pacing Amplitude: 2.5 V
Lead Channel Setting Pacing Pulse Width: 0.4 ms
Lead Channel Setting Sensing Sensitivity: 0.3 mV

## 2019-07-27 NOTE — Progress Notes (Signed)
Remote ICD transmission.   

## 2019-07-28 ENCOUNTER — Encounter: Payer: Medicaid Other | Admitting: Internal Medicine

## 2019-08-04 ENCOUNTER — Other Ambulatory Visit (HOSPITAL_COMMUNITY): Payer: Medicare Other

## 2019-08-04 ENCOUNTER — Encounter (HOSPITAL_COMMUNITY): Payer: Self-pay | Admitting: *Deleted

## 2019-08-04 NOTE — Progress Notes (Signed)
Patient ID: Darin Brady, male   DOB: [birth year], 64 y.o.   MRN: [id number] Verified appointment"No Show" status with Darin Brady 01:19 pm

## 2019-08-06 ENCOUNTER — Other Ambulatory Visit (HOSPITAL_COMMUNITY): Payer: Medicare Other

## 2019-08-06 ENCOUNTER — Encounter (HOSPITAL_COMMUNITY): Payer: Self-pay | Admitting: Cardiology

## 2019-08-06 NOTE — Progress Notes (Unsigned)
Patient ID: Darin Brady, male   DOB: 1955-09-06, 64 y.o.   MRN: [id number]  Verified appointment "no show" status with Melissa at 3:00pm.

## 2019-08-12 ENCOUNTER — Telehealth (HOSPITAL_COMMUNITY): Payer: Self-pay | Admitting: Cardiovascular Disease

## 2019-08-12 NOTE — Telephone Encounter (Signed)
Just an FYI. We have made several attempts to contact this patient including sending a letter to schedule or reschedule their echocardiogram. We will be removing the patient from the echo WQ.   Patient No Showed : 08/04/2019 08/06/2019    Thank you

## 2019-09-03 ENCOUNTER — Telehealth: Payer: Self-pay | Admitting: Physician Assistant

## 2019-09-03 NOTE — Telephone Encounter (Signed)
I attempted to contact patient to reschedule appt 09/03/19, no answer left message for patient to return call to get appt scheduled.

## 2019-09-22 ENCOUNTER — Other Ambulatory Visit: Payer: Self-pay | Admitting: Cardiovascular Disease

## 2019-10-26 ENCOUNTER — Ambulatory Visit (INDEPENDENT_AMBULATORY_CARE_PROVIDER_SITE_OTHER): Payer: Medicare Other | Admitting: *Deleted

## 2019-10-26 DIAGNOSIS — I5022 Chronic systolic (congestive) heart failure: Secondary | ICD-10-CM | POA: Diagnosis not present

## 2019-10-26 LAB — CUP PACEART REMOTE DEVICE CHECK
Battery Remaining Longevity: 131 mo
Battery Voltage: 3.03 V
Brady Statistic RV Percent Paced: 0.01 %
Date Time Interrogation Session: 20210816103723
HighPow Impedance: 76 Ohm
Implantable Lead Implant Date: 20200508
Implantable Lead Location: 753860
Implantable Lead Model: 6935
Implantable Pulse Generator Implant Date: 20200508
Lead Channel Impedance Value: 399 Ohm
Lead Channel Impedance Value: 513 Ohm
Lead Channel Pacing Threshold Amplitude: 0.5 V
Lead Channel Pacing Threshold Pulse Width: 0.4 ms
Lead Channel Sensing Intrinsic Amplitude: 11 mV
Lead Channel Sensing Intrinsic Amplitude: 11 mV
Lead Channel Setting Pacing Amplitude: 2.5 V
Lead Channel Setting Pacing Pulse Width: 0.4 ms
Lead Channel Setting Sensing Sensitivity: 0.3 mV

## 2019-10-27 NOTE — Progress Notes (Signed)
Remote ICD transmission.   

## 2019-11-06 ENCOUNTER — Other Ambulatory Visit: Payer: Self-pay | Admitting: Cardiovascular Disease

## 2019-11-06 DIAGNOSIS — Z79899 Other long term (current) drug therapy: Secondary | ICD-10-CM

## 2019-12-22 ENCOUNTER — Other Ambulatory Visit: Payer: Self-pay

## 2019-12-22 ENCOUNTER — Ambulatory Visit (INDEPENDENT_AMBULATORY_CARE_PROVIDER_SITE_OTHER): Payer: Medicare Other

## 2019-12-22 ENCOUNTER — Encounter: Payer: Self-pay | Admitting: Podiatry

## 2019-12-22 ENCOUNTER — Ambulatory Visit (INDEPENDENT_AMBULATORY_CARE_PROVIDER_SITE_OTHER): Payer: Medicare Other | Admitting: Podiatry

## 2019-12-22 DIAGNOSIS — Q828 Other specified congenital malformations of skin: Secondary | ICD-10-CM

## 2019-12-22 DIAGNOSIS — M2041 Other hammer toe(s) (acquired), right foot: Secondary | ICD-10-CM | POA: Diagnosis not present

## 2019-12-22 DIAGNOSIS — M7751 Other enthesopathy of right foot: Secondary | ICD-10-CM

## 2019-12-23 NOTE — Progress Notes (Signed)
Subjective:  Patient ID: Darin Brady, male    DOB: 01/08/1956,  MRN: [id number] HPI Chief Complaint  Patient presents with  . Foot Pain    Sub 5th met and 2nd toe (medial) right - callused areas x years, getting worse, tried creams and vaseline  . New Patient (Initial Visit)    64 y.o. male presents with the above complaint.   ROS: Denies fever chills nausea vomiting muscle aches pains calf pain back pain chest pain shortness of breath.  Past Medical History:  Diagnosis Date  . Acute otitis media 04/01/2015  . Alcohol abuse   . Chronic combined systolic and diastolic CHF (congestive heart failure) ([place])    a. 2D Echo 02/22/15 showed EF 20-25%, multiple WMA, grade 2 DD, aortic sclerosis, mild MR, severely dilated LA, cannot exclude PFO.  Marland Kitchen Cough 04/01/2015  . Epigastric pain   . Essential hypertension   . Leukopenia    a. Noted on labs, unclear what prior w/u.  Marland Kitchen NICM (nonischemic cardiomyopathy) ([place])    a. LHC 1/22016: normal cors.  . Non-ischemic cardiomyopathy ([place]) 03/03/2015  . Pulmonary hypertension ([place])    a. moderate pulm HTN by cath 02/2015.  Marland Kitchen Syncope 07/08/2013   syncope   . Tobacco abuse    Past Surgical History:  Procedure Laterality Date  . CARDIAC CATHETERIZATION N/A 02/24/2015   Procedure: Right/Left Heart Cath and Coronary Angiography;  Surgeon: Peter M Martinique, MD;  Location: [place] [facility];  Service: Cardiovascular;  Laterality: N/A;  . CLAVICLE SURGERY     15 years ago  . ICD IMPLANT N/A 07/18/2018   Procedure: ICD IMPLANT;  Surgeon: Constance Haw, MD;  Location: [place] [facility];  Service: Cardiovascular;  Laterality: N/A;  . ORIF ANKLE FRACTURE  02/03/2011   Procedure: OPEN REDUCTION INTERNAL FIXATION (ORIF) ANKLE FRACTURE;  Surgeon: Yvette Rack., MD;  Location: [facility];  Service: Orthopedics;  Laterality: Left;  . STOMACH SURGERY     stop bleeding from a trauma    Current Outpatient Medications:  .  carvedilol (COREG) 12.5 MG  tablet, Take 6.60m, half tablet, in AM; Take 12.520m whole tablet, in PM, Disp: 180 tablet, Rfl: 3 .  furosemide (LASIX) 20 MG tablet, TAKE 20MG DAILY AND AN EXTRA 20MG AS NEEDED FOR FLUID RETENTION, Disp: 90 tablet, Rfl: 3 .  lisinopril (ZESTRIL) 10 MG tablet, Take 1 tablet (10 mg total) by mouth at bedtime., Disp: 90 tablet, Rfl: 3 .  sildenafil (VIAGRA) 100 MG tablet, Take 100 mg by mouth daily as needed., Disp: , Rfl:  .  spironolactone (ALDACTONE) 25 MG tablet, Take 1 tablet (25 mg total) by mouth at bedtime., Disp: 90 tablet, Rfl: 3  No Known Allergies Review of Systems Objective:  There were no vitals filed for this visit.  General: Well developed, nourished, in no acute distress, alert and oriented x3   Dermatological: Skin is warm, dry and supple bilateral. Nails x 10 are well maintained; remaining integument appears unremarkable at this time. There are no open sores, no preulcerative lesions, no rash or signs of infection present.  He has a reactive hyperkeratotic lesion porokeratosis subfifth metatarsal head of the right foot.  There is also subcutaneous bursitis associated with this with mild erythema and fluctuance.  No signs of abscess.  He also has a reactive hyperkeratotic lesion medial aspect of the second digit right foot at the level of the PIPJ no open lesions or wounds are noted.   Vascular: Dorsalis Pedis  artery and Posterior Tibial artery pedal pulses are 2/4 bilateral with immedate capillary fill time. Pedal hair growth present. No varicosities and no lower extremity edema present bilateral.   Neruologic: Grossly intact via light touch bilateral. Vibratory intact via tuning fork bilateral. Protective threshold with Semmes Wienstein monofilament intact to all pedal sites bilateral. Patellar and Achilles deep tendon reflexes 2+ bilateral. No Babinski or clonus noted bilateral.   Musculoskeletal: No gross boney pedal deformities bilateral. No pain, crepitus, or limitation  noted with foot and ankle range of motion bilateral. Muscular strength 5/5 in all groups tested bilateral.  Gait: Unassisted, Nonantalgic.    Radiographs:  Radiographs taken today do not demonstrate any type of osseous abnormalities on the soft tissue increase in density forefoot subfifth right.  Assessment & Plan:   Assessment: Bursitis capsulitis fifth metatarsophalangeal joint.  Porokeratosis medial aspect of the second toe as well as subfifth metatarsal head of the right foot.  Plan: Discussed etiology pathology conservative versus surgical therapies at this point injected a small amount of dexamethasone 2 mg with local anesthetic into the bursa fifth right.  Tolerated procedure well without complications.  I was able to enucleate the lesion at this time and also debrided the medial aspect of the second toe placed silicone padding and discussed shoe alternatives.     Storie Heffern T. Miles, [place]

## 2020-01-12 ENCOUNTER — Encounter: Payer: Self-pay | Admitting: Cardiology

## 2020-01-14 ENCOUNTER — Telehealth: Payer: Medicare Other | Admitting: Cardiology

## 2020-01-19 ENCOUNTER — Ambulatory Visit: Payer: Medicare Other | Admitting: Physician Assistant

## 2020-01-19 ENCOUNTER — Other Ambulatory Visit: Payer: Self-pay

## 2020-01-19 ENCOUNTER — Encounter: Payer: Self-pay | Admitting: Podiatry

## 2020-01-19 ENCOUNTER — Ambulatory Visit (INDEPENDENT_AMBULATORY_CARE_PROVIDER_SITE_OTHER): Payer: Medicare Other | Admitting: Podiatry

## 2020-01-19 DIAGNOSIS — R52 Pain, unspecified: Secondary | ICD-10-CM | POA: Diagnosis not present

## 2020-01-19 DIAGNOSIS — M2011 Hallux valgus (acquired), right foot: Secondary | ICD-10-CM

## 2020-01-19 DIAGNOSIS — M2041 Other hammer toe(s) (acquired), right foot: Secondary | ICD-10-CM | POA: Diagnosis not present

## 2020-01-19 DIAGNOSIS — M21611 Bunion of right foot: Secondary | ICD-10-CM

## 2020-01-19 DIAGNOSIS — L84 Corns and callosities: Secondary | ICD-10-CM | POA: Diagnosis not present

## 2020-01-19 NOTE — Progress Notes (Signed)
  Subjective:  Patient ID: Darin Brady, male    DOB: 11/25/1955,  MRN: [id number]  Chief Complaint  Patient presents with  . Foot Pain    right foot. PT stated that he is doing well but his right hallux nail is sore     64 y.o. male presents with the above complaint. History confirmed with patient. Has painful callus on 2nd toe. Tried a corn pad last visit and this didn't help.  Objective:  Physical Exam: warm, good capillary refill, no trophic changes or ulcerative lesions, normal DP and PT pulses and normal sensory exam.   Right foot: hallux valgus abutting the 2nd hammertoe with a heloma molle present medial 2nd Assessment:   1. Hallux valgus with bunions, right   2. Hammertoe of right foot   3. Callus of foot   4. Pain      Plan:  Patient was evaluated and treated and all questions answered.  All symptomatic hyperkeratoses were safely debrided with a sterile #15 blade to patient's level of comfort without incident. We discussed preventative and palliative care of these lesions including supportive and accommodative shoegear, padding, prefabricated and custom molded accommodative orthoses, use of a pumice stone and lotions/creams daily.  Discussed that the callus is from rubbing of the hallux and 2nd hammertoe, recommend we continue non surgical treatment of these. Silicone toe caps were dispensed. We discussed the possibility of correction of the bunion and hammertoes but that this may be higher risk for him due to his smoking status.  Return in about 6 weeks (around 03/01/2020).

## 2020-02-09 ENCOUNTER — Encounter: Payer: Self-pay | Admitting: Cardiology

## 2020-02-09 ENCOUNTER — Telehealth (INDEPENDENT_AMBULATORY_CARE_PROVIDER_SITE_OTHER): Payer: Medicare Other | Admitting: Cardiology

## 2020-02-09 VITALS — Ht 69.0 in | Wt 147.0 lb

## 2020-02-09 DIAGNOSIS — I5022 Chronic systolic (congestive) heart failure: Secondary | ICD-10-CM | POA: Diagnosis not present

## 2020-02-09 DIAGNOSIS — Z95810 Presence of automatic (implantable) cardiac defibrillator: Secondary | ICD-10-CM | POA: Diagnosis not present

## 2020-02-09 DIAGNOSIS — I10 Essential (primary) hypertension: Secondary | ICD-10-CM

## 2020-02-09 DIAGNOSIS — Z79899 Other long term (current) drug therapy: Secondary | ICD-10-CM

## 2020-02-09 DIAGNOSIS — I428 Other cardiomyopathies: Secondary | ICD-10-CM | POA: Diagnosis not present

## 2020-02-09 DIAGNOSIS — F1010 Alcohol abuse, uncomplicated: Secondary | ICD-10-CM

## 2020-02-09 NOTE — Progress Notes (Signed)
Virtual Visit via Telephone Note   This visit type was conducted due to national recommendations for restrictions regarding the COVID-19 Pandemic (e.g. social distancing) in an effort to limit this patient's exposure and mitigate transmission in our community.  Due to his co-morbid illnesses, this patient is at least at moderate risk for complications without adequate follow up.  This format is felt to be most appropriate for this patient at this time.  The patient did not have access to video technology/had technical difficulties with video requiring transitioning to audio format only (telephone).  All issues noted in this document were discussed and addressed.  No physical exam could be performed with this format.  Please refer to the patient's chart for his  consent to telehealth for [facility].    Date:  02/09/2020   ID:  Darin Brady, DOB [birth year], MRN [id number] The patient was identified using 2 identifiers.  Patient Location: Home Provider Location: Home Office  PCP:  Dellia Cloud, MD  Cardiologist:  Dr Allyson Sabal Electrophysiologist:  Sherryl Manges, MD   Evaluation Performed:  Follow-Up Visit  Chief Complaint:  none  History of Present Illness:    Darin Brady is a 64 y.o. male with a history of a nonischemic cardiomyopathy.  His ejection fraction in 2016 was 15 to 20%.  He had normal coronaries at that time.  He has a history of hypertension, smoking, and daily EtOH.  His last echocardiogram was in March 2020 and his ejection fraction remained 15 to 20%.  In May 2020 he had a Medtronic ICD placed.  Dr. Graciela Husbands follows this.  Despite his severely depressed LV function the patient has done well symptomatically on medical therapy.  He was contacted today for 50-month follow-up.  Since we spoke to him last he denies any unusual shortness of breath or edema.  He reports compliance with his medications and low sodium diet.  The patient does not have symptoms concerning for  COVID-19 infection (fever, chills, cough, or new shortness of breath).    Past Medical History:  Diagnosis Date  . Acute otitis media 04/01/2015  . Alcohol abuse   . Chronic combined systolic and diastolic CHF (congestive heart failure) ([facility])    a. 2D Echo 02/22/15 showed EF 20-25%, multiple WMA, grade 2 DD, aortic sclerosis, mild MR, severely dilated LA, cannot exclude PFO.  Marland Kitchen Cough 04/01/2015  . Epigastric pain   . Essential hypertension   . Leukopenia    a. Noted on labs, unclear what prior w/u.  Marland Kitchen NICM (nonischemic cardiomyopathy) ([facility])    a. LHC 1/22016: normal cors.  . Non-ischemic cardiomyopathy ([facility]) 03/03/2015  . Pulmonary hypertension ([facility])    a. moderate pulm HTN by cath 02/2015.  Marland Kitchen Syncope 07/08/2013   syncope   . Tobacco abuse    Past Surgical History:  Procedure Laterality Date  . CARDIAC CATHETERIZATION N/A 02/24/2015   Procedure: Right/Left Heart Cath and Coronary Angiography;  Surgeon: Peter M Swaziland, MD;  Location: [facility] INV[facility]IVE [facility];  Service: Cardiovascular;  Laterality: N/A;  . CLAVICLE SURGERY     15 years ago  . ICD IMPLANT N/A 07/18/2018   Procedure: ICD IMPLANT;  Surgeon: Regan Lemming, MD;  Location: [facility] INVASIVE CV LAB;  Service: Cardiovascular;  Laterality: N/A;  . ORIF ANKLE FRACTURE  02/03/2011   Procedure: OPEN REDUCTION INTERNAL FIXATION (ORIF) ANKLE FRACTURE;  Surgeon: Thera Flake., MD;  [facility];  Service: Orthopedics;  Laterality: Left;  . STOMACH SURGERY  stop bleeding from a trauma     Current Meds  Medication Sig  . carvedilol (COREG) 12.5 MG tablet Take 6.25mg , half tablet, in AM; Take 12.5mg , whole tablet, in PM  . furosemide (LASIX) 20 MG tablet TAKE 20MG  DAILY AND AN EXTRA 20MG  AS NEEDED FOR FLUID RETENTION  . lisinopril (ZESTRIL) 10 MG tablet Take 1 tablet (10 mg total) by mouth at bedtime.  . sildenafil (VIAGRA) 100 MG tablet Take 100 mg by mouth daily as needed.  spironolactone (ALDACTONE) 25 MG tablet Take 1  tablet (25 mg total) by mouth at bedtime.     Allergies:   Patient has no known allergies.   Social History   Tobacco Use  . Smoking status: Current Every Day Smoker    Packs/day: 0.20    Last attempt to quit: 03/24/2015    Years since quitting: 4.8  . Smokeless tobacco: Never Used  . Tobacco comment: 3-4 CIAGARETTES A DAY  Substance Use Topics  . Alcohol use: Yes    Alcohol/week: 0.0 standard drinks    Comment: 1-2 sixteen ounce beers daily.  . Drug use: No     Family Hx: The patient's family history includes Asthma in his mother; Heart attack in his brother; Hypertension in his brother and mother. There is no history of Stroke.  ROS:   Please see the history of present illness.    All other systems reviewed and are negative.   Prior CV studies:   The following studies were reviewed today: Echo March 2020- IMPRESSIONS    1. The left ventricle has a visually estimated ejection fraction of  15-20%. The cavity size was moderately dilated. Left ventricular diastolic  Doppler parameters are indeterminate. Elevated left ventricular  end-diastolic pressure The E/e' is 05/22/2015.  2. The right ventricle has severely reduced systolic function. The cavity  was normal. There is no increase in right ventricular wall thickness.  3. The mitral valve is abnormal. Moderate thickening of the mitral valve  leaflet. Mitral valve regurgitation is mild to moderate by color flow  Doppler. 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  4. The aortic valve is tricuspid. Mild sclerosis of the aortic valve.  5. No intracardiac thrombi or masses were visualized.    Labs/Other Tests and Data Reviewed:    EKG:  An ECG dated 07/14/2019 was personally reviewed today and demonstrated:  NSR, poor anterior RW  Recent Labs: No results found for requested labs within last 8760 hours.   Recent Lipid Panel Lab Results  Component Value Date/Time   CHOL 103  03/24/2018 08:55 AM   TRIG 77 03/24/2018 08:55 AM   HDL 38 (L) 03/24/2018 08:55 AM   CHOLHDL 2.7 03/24/2018 08:55 AM   CHOLHDL 3.4 02/22/2015 03:40 AM   LDLCALC 50 03/24/2018 08:55 AM    Wt Readings from Last 3 Encounters:  02/09/20 147 lb (66.7 kg)  07/14/19 128 lb 3.2 oz (58.2 kg)  10/27/18 127 lb (57.6 kg)     Risk Assessment/Calculations:      Objective:    Vital Signs:  Ht 5\' 9"  (1.753 m)   Wt 147 lb (66.7 kg)   BMI 21.71 kg/m    VITAL SIGNS:  reviewed  ASSESSMENT & PLAN:    NICM- stable on medical Rx  ICD- MDT ICD May 2020  Daily ETOH-  Smoker- 1/2 PPD  Plan- Check labs- OV 6 months,    Shared Decision Making/Informed Consent  COVID-19 Education: The signs and symptoms of COVID-19 were discussed with the patient and how to seek care for testing (follow up with PCP or arrange E-visit).  The importance of social distancing was discussed today.  Time:   Today, I have spent 15 minutes with the patient with telehealth technology discussing the above problems.     Medication Adjustments/Labs and Tests Ordered: Current medicines are reviewed at length with the patient today.  Concerns regarding medicines are outlined above.   Tests Ordered: No orders of the defined types were placed in this encounter.   Medication Changes: No orders of the defined types were placed in this encounter.   Follow Up:  In Person Dr Allyson Sabal  Signed, Corine Shelter, PA-C  02/09/2020 9:55 AM    [facility]

## 2020-02-09 NOTE — Patient Instructions (Signed)
Medication Instructions:  Continue current medications  *If you need a refill on your cardiac medications before your next appointment, please call your pharmacy*   Lab Work: CBC and BMP  If you have labs (blood work) drawn today and your tests are completely normal, you will receive your results only by: Marland Kitchen MyChart Message (if you have MyChart) OR . A paper copy in the mail If you have any lab test that is abnormal or we need to change your treatment, we will call you to review the results.   Testing/Procedures: None ordered   Follow-Up: At [facility], you and your health needs are our priority.  As part of our continuing mission to provide you with exceptional heart care, we have created designated Provider Care Teams.  These Care Teams include your primary Cardiologist (physician) and Advanced Practice Providers (APPs -  Physician Assistants and Nurse Practitioners) who all work together to provide you with the care you need, when you need it.  We recommend signing up for the patient portal called "MyChart".  Sign up information is provided on this After Visit Summary.  MyChart is used to connect with patients for Virtual Visits (Telemedicine).  Patients are able to view lab/test results, encounter notes, upcoming appointments, etc.  Non-urgent messages can be sent to your provider as well.   To learn more about what you can do with MyChart, go to [email or web address].    Your next appointment:   6 month(s)  The format for your next appointment:   In Person  Provider:   You may see Nanetta Batty, MD or one of the following Advanced Practice Providers on your designated Care Team:    Corine Shelter, PA-C  [place], [place]  Edd Fabian, [place]

## 2020-03-01 ENCOUNTER — Ambulatory Visit: Payer: Medicare Other | Admitting: Podiatry

## 2020-03-16 ENCOUNTER — Other Ambulatory Visit: Payer: Self-pay | Admitting: Cardiovascular Disease

## 2020-07-07 ENCOUNTER — Telehealth: Payer: Self-pay | Admitting: *Deleted

## 2020-07-07 NOTE — Telephone Encounter (Signed)
A message was left, re: his follow up visit. 

## 2020-07-25 ENCOUNTER — Ambulatory Visit (INDEPENDENT_AMBULATORY_CARE_PROVIDER_SITE_OTHER): Payer: Medicare Other

## 2020-07-25 DIAGNOSIS — I428 Other cardiomyopathies: Secondary | ICD-10-CM

## 2020-07-26 LAB — CUP PACEART REMOTE DEVICE CHECK
Battery Remaining Longevity: 125 mo
Battery Voltage: 3.02 V
Brady Statistic RV Percent Paced: 0.01 %
Date Time Interrogation Session: 20220516063327
HighPow Impedance: 65 Ohm
Implantable Lead Implant Date: 20200508
Implantable Lead Location: 753860
Implantable Lead Model: 6935
Implantable Pulse Generator Implant Date: 20200508
Lead Channel Impedance Value: 437 Ohm
Lead Channel Impedance Value: 494 Ohm
Lead Channel Pacing Threshold Amplitude: 0.625 V
Lead Channel Pacing Threshold Pulse Width: 0.4 ms
Lead Channel Sensing Intrinsic Amplitude: 13.125 mV
Lead Channel Sensing Intrinsic Amplitude: 13.125 mV
Lead Channel Setting Pacing Amplitude: 2.5 V
Lead Channel Setting Pacing Pulse Width: 0.4 ms
Lead Channel Setting Sensing Sensitivity: 0.3 mV

## 2020-08-04 ENCOUNTER — Other Ambulatory Visit: Payer: Self-pay

## 2020-08-04 ENCOUNTER — Ambulatory Visit: Payer: Medicare Other | Admitting: Cardiovascular Disease

## 2020-08-16 NOTE — Progress Notes (Signed)
Remote ICD transmission.   

## 2020-09-15 ENCOUNTER — Other Ambulatory Visit: Payer: Self-pay

## 2020-09-15 ENCOUNTER — Ambulatory Visit (INDEPENDENT_AMBULATORY_CARE_PROVIDER_SITE_OTHER): Payer: Medicare Other | Admitting: Podiatry

## 2020-09-15 DIAGNOSIS — M2041 Other hammer toe(s) (acquired), right foot: Secondary | ICD-10-CM

## 2020-09-15 DIAGNOSIS — M2011 Hallux valgus (acquired), right foot: Secondary | ICD-10-CM | POA: Diagnosis not present

## 2020-09-15 DIAGNOSIS — M21611 Bunion of right foot: Secondary | ICD-10-CM | POA: Diagnosis not present

## 2020-09-15 DIAGNOSIS — L84 Corns and callosities: Secondary | ICD-10-CM

## 2020-09-18 NOTE — Progress Notes (Signed)
Subjective:   Patient ID: Darin Brady, male   DOB: 65 y.o.   MRN: [id number]   HPI Patient presents stating he is concerned about a structural deformity and also the fact that the lesions and pain continues to recur over time.  Wants to know about more permanent solutions   ROS      Objective:  Physical Exam  Neurovascular status intact muscle strength adequate no change health history with patient found to have significant structural foot deformity bilateral with structural bunion deformity digital deformities with elevation of the lesser digits.  It is moderate with keratotic tissue formation forming between the toes secondary to the stress and pressure of the adjacent digits     Assessment:  Structural deformity which is creating pressure on the skin structure leading to lesion formation pain     Plan:  H&P reviewed his x-rays with him and discussed considerations structural correction educating him on procedures possible.  At this point debridement padding wider shoes and soaks will be continued and if symptoms were to continue to intensify and worsen surgical intervention may become necessary of which education rendered today

## 2020-09-19 ENCOUNTER — Other Ambulatory Visit: Payer: Self-pay | Admitting: Internal Medicine

## 2020-09-20 LAB — COMPLETE METABOLIC PANEL WITH GFR
AG Ratio: 1.5 (calc) (ref 1.0–2.5)
ALT: 18 U/L (ref 9–46)
AST: 29 U/L (ref 10–35)
Albumin: 4.4 g/dL (ref 3.6–5.1)
Alkaline phosphatase (APISO): 70 U/L (ref 35–144)
BUN: 15 mg/dL (ref 7–25)
CO2: 19 mmol/L — ABNORMAL LOW (ref 20–32)
Calcium: 9 mg/dL (ref 8.6–10.3)
Chloride: 99 mmol/L (ref 98–110)
Creat: 1.26 mg/dL (ref 0.70–1.35)
Globulin: 2.9 g/dL (calc) (ref 1.9–3.7)
Glucose, Bld: 75 mg/dL (ref 65–99)
Potassium: 4.3 mmol/L (ref 3.5–5.3)
Sodium: 130 mmol/L — ABNORMAL LOW (ref 135–146)
Total Bilirubin: 1 mg/dL (ref 0.2–1.2)
Total Protein: 7.3 g/dL (ref 6.1–8.1)
eGFR: 63 mL/min/{1.73_m2} (ref >=60)

## 2020-09-20 LAB — PSA: PSA: 2.26 ng/mL (ref <=4.00)

## 2020-09-20 LAB — LIPID PANEL
Cholesterol: 127 mg/dL (ref <200)
HDL: 72 mg/dL (ref >=40)
LDL Cholesterol (Calc): 34 mg/dL (calc)
Non-HDL Cholesterol (Calc): 55 mg/dL (calc) (ref <130)
Total CHOL/HDL Ratio: 1.8 (calc) (ref <5.0)
Triglycerides: 131 mg/dL (ref <150)

## 2020-09-20 LAB — CBC
HCT: 35.5 % — ABNORMAL LOW (ref 38.5–50.0)
Hemoglobin: 11.7 g/dL — ABNORMAL LOW (ref 13.2–17.1)
MCH: 32.4 pg (ref 27.0–33.0)
MCHC: 33 g/dL (ref 32.0–36.0)
MCV: 98.3 fL (ref 80.0–100.0)
MPV: 9 fL (ref 7.5–12.5)
Platelets: 247 10*3/uL (ref 140–400)
RBC: 3.61 10*6/uL — ABNORMAL LOW (ref 4.20–5.80)
RDW: 12.1 % (ref 11.0–15.0)
WBC: 3.7 10*3/uL — ABNORMAL LOW (ref 3.8–10.8)

## 2020-09-20 LAB — VITAMIN D 25 HYDROXY (VIT D DEFICIENCY, FRACTURES): Vit D, 25-Hydroxy: 54 ng/mL (ref 30–100)

## 2020-09-20 LAB — TSH: TSH: 0.62 mIU/L (ref 0.40–4.50)

## 2020-09-24 ENCOUNTER — Other Ambulatory Visit: Payer: Self-pay | Admitting: Cardiovascular Disease

## 2020-09-24 DIAGNOSIS — Z79899 Other long term (current) drug therapy: Secondary | ICD-10-CM

## 2020-10-13 ENCOUNTER — Other Ambulatory Visit: Payer: Self-pay | Admitting: Cardiovascular Disease

## 2020-10-24 ENCOUNTER — Ambulatory Visit (INDEPENDENT_AMBULATORY_CARE_PROVIDER_SITE_OTHER): Payer: Medicare Other

## 2020-10-24 DIAGNOSIS — I428 Other cardiomyopathies: Secondary | ICD-10-CM | POA: Diagnosis not present

## 2020-10-24 LAB — CUP PACEART REMOTE DEVICE CHECK
Battery Remaining Longevity: 123 mo
Battery Voltage: 3.02 V
Brady Statistic RV Percent Paced: 0.01 %
Date Time Interrogation Session: 20220815044223
HighPow Impedance: 72 Ohm
Implantable Lead Implant Date: 20200508
Implantable Lead Location: 753860
Implantable Lead Model: 6935
Implantable Pulse Generator Implant Date: 20200508
Lead Channel Impedance Value: 437 Ohm
Lead Channel Impedance Value: 513 Ohm
Lead Channel Pacing Threshold Amplitude: 0.5 V
Lead Channel Pacing Threshold Pulse Width: 0.4 ms
Lead Channel Sensing Intrinsic Amplitude: 11.125 mV
Lead Channel Sensing Intrinsic Amplitude: 11.125 mV
Lead Channel Setting Pacing Amplitude: 2.5 V
Lead Channel Setting Pacing Pulse Width: 0.4 ms
Lead Channel Setting Sensing Sensitivity: 0.3 mV

## 2020-11-11 NOTE — Progress Notes (Signed)
Remote ICD transmission.   

## 2021-01-12 ENCOUNTER — Other Ambulatory Visit: Payer: Self-pay | Admitting: Cardiovascular Disease

## 2021-01-18 ENCOUNTER — Encounter: Payer: Self-pay | Admitting: Cardiovascular Disease

## 2021-01-18 ENCOUNTER — Other Ambulatory Visit: Payer: Self-pay

## 2021-01-18 ENCOUNTER — Ambulatory Visit (INDEPENDENT_AMBULATORY_CARE_PROVIDER_SITE_OTHER): Payer: Medicare Other | Admitting: Cardiovascular Disease

## 2021-01-18 VITALS — BP 138/70 | HR 75 | Ht 69.0 in | Wt 129.6 lb

## 2021-01-18 DIAGNOSIS — I10 Essential (primary) hypertension: Secondary | ICD-10-CM | POA: Diagnosis not present

## 2021-01-18 DIAGNOSIS — F1010 Alcohol abuse, uncomplicated: Secondary | ICD-10-CM

## 2021-01-18 DIAGNOSIS — Z720 Tobacco use: Secondary | ICD-10-CM

## 2021-01-18 DIAGNOSIS — I428 Other cardiomyopathies: Secondary | ICD-10-CM

## 2021-01-18 NOTE — Assessment & Plan Note (Signed)
Ongoing tobacco abuse of 1/2 pack/day recalcitrant to receptor modification. 

## 2021-01-18 NOTE — Assessment & Plan Note (Signed)
History of ongoing alcohol abuse with 3-4 beers a night.

## 2021-01-18 NOTE — Assessment & Plan Note (Signed)
History of nonischemic cardiomyopathy with right and left heart cath performed by Dr. Swaziland 02/24/2015 revealing normal coronary arteries and EF of 50% at that time with moderate pulmonary hypertension.  Subsequent 2D echo 7 revealed a decline in his EF to 15 to 20% most recently on 06/10/2018 with mild to moderate MR.  As result of this, he did have an ICD implanted by Dr. Elberta Fortis for primary prevention 07/18/2018.  There have been no discharges.  He is on lisinopril, carvedilol and spironolactone and is minimally symptomatic.  I am going to recheck a 2D echocardiogram and refer him to our Pharm.D.'s for initiation of Entresto and titration of his medications.

## 2021-01-18 NOTE — Assessment & Plan Note (Signed)
History of essential hypertension a blood pressure measured today 138/70.  He is on carvedilol, lisinopril and spironolactone.

## 2021-01-18 NOTE — Patient Instructions (Addendum)
Medication Instructions:  Your physician recommends that you continue on your current medications as directed. Please refer to the Current Medication list given to you today.  *If you need a refill on your cardiac medications before your next appointment, please call your pharmacy*   Testing/Procedures: Your physician has requested that you have an echocardiogram. Echocardiography is a painless test that uses sound waves to create images of your heart. It provides your doctor with information about the size and shape of your heart and how well your heart's chambers and valves are working. This procedure takes approximately one hour. There are no restrictions for this procedure. This procedure is done at [address].   Follow-Up: At [facility], you and your health needs are our priority.  As part of our continuing mission to provide you with exceptional heart care, we have created designated Provider Care Teams.  These Care Teams include your primary Cardiologist (physician) and Advanced Practice Providers (APPs -  Physician Assistants and Nurse Practitioners) who all work together to provide you with the care you need, when you need it.  We recommend signing up for the patient portal called "MyChart".  Sign up information is provided on this After Visit Summary.  MyChart is used to connect with patients for Virtual Visits (Telemedicine).  Patients are able to view lab/test results, encounter notes, upcoming appointments, etc.  Non-urgent messages can be sent to your provider as well.   To learn more about what you can do with MyChart, go to [email or web address].    Your next appointment:   6 month(s)  The format for your next appointment:   In Person  Provider:   Edd Fabian, FNP, Micah Flesher, PA-C, Marjie Skiff, PA-C, Juanda Crumble, PA-C, Joni Reining, DNP, ANP, or Azalee Course, PA-C      Then, Nanetta Batty, MD will plan to see you again in 12 month(s).  Other  Instructions Dr. Allyson Sabal has requested that you schedule an appointment with one of our clinical pharmacists for a initiation of entresto and titration of medications.   -Pt also needs follow up appointment with EP for follow up on ICD.

## 2021-01-18 NOTE — Progress Notes (Signed)
01/18/2021 [name]   1955/05/28  [id number]  Primary Physician Dellia Cloud, MD Primary Cardiologist: Runell Gess MD Nicholes Calamity, [place]  HPI:  Giulian Goldring is a 65 y.o.  thin appearing single African American male with no children referred for evaluation of syncope.  I last  saw him in the office 07/14/2019.  He has not worked for 8 years. His risk factors include 40 pack years of tobacco abuse currently smoking 5 cigarettes a day as well as hypertension. He is a 77 year old brother died of a massive myocardial infarction. He drinks 2-3 beers a day 4 days a week. He's had episodes of syncope, one in March and one in April. There were sudden onset without seizure activity.   He had right and left heart cath performed by Dr. Swaziland 02/24/2015 revealing normal coronary arteries, EF of 50% with moderate pulmonary hypertension.  He continues to smoke 2 cigarettes a day and drinks 3-4 beers a day.  He does have most likely class III symptoms was severe shortness of breath on exertion.  Has not taken any cardiac meds for the last 3 years.  He denies chest pain.   Since I saw him a month ago I have referred him to Dr. Berton Mount for consideration of ICD implantation for primary prevention.  He had a Holter monitor performed 04/30/2018 that showed PVCs.  There were no arrhythmias noted.    When I initially saw him in January he had class III heart failure and an ultrasound performed 03/21/2018 revealed LV dilatation with an EF of 15 to 20%.  I did begin him on heart failure medications and when he saw Dr. Graciela Husbands back in the office 04/14/2018 his symptoms had significantly improved.  I saw him back on 05/13/2018 at that time his heart failure symptoms were class I or II.  He is aware of the importance of salt avoidance.  I had sent him to Dr. Graciela Husbands for consideration of ICD implantation.  Repeat 2D echo after 3 months of medical therapy performed 06/10/2018 revealed continued severe LV  dysfunction with an EF of 15 to 20% and decreased LV dimensions with mild to moderate MR.  He does continues to drink several beers a day but has cut down on his cigarettes from a pack a day down to 3 cigarettes a day.   He also had an ICD placed by Dr. Elberta Fortis for primary prevention 07/18/2018.  He has had no discharges.   Since I saw him a year and a half ago he continues to do well.  He does unfortunately still smoke 1/2 pack of cigarettes a day and drink several beers a night.  He has had no ICD discharges.  He denies chest pain or shortness of breath.   Current Meds  Medication Sig   albuterol (VENTOLIN HFA) 108 (90 Base) MCG/ACT inhaler Inhale 2 puffs into the lungs every 6 (six) hours as needed.   carvedilol (COREG) 12.5 MG tablet Take 6.25mg , half tablet, in AM; Take 12.5mg , whole tablet, in PM   diclofenac Sodium (VOLTAREN) 1 % GEL SMARTSIG:4 Gram(s) Topical 4 Times Daily PRN   furosemide (LASIX) 20 MG tablet TAKE 20MG  DAILY AND AN EXTRA 20MG  AS NEEDED FOR FLUID RETENTION   lisinopril (ZESTRIL) 10 MG tablet Take 1 tablet (10 mg total) by mouth at bedtime.   sildenafil (VIAGRA) 100 MG tablet Take 100 mg by mouth daily as needed.   spironolactone (ALDACTONE) 25 MG tablet Take 1 tablet (  25 mg total) by mouth at bedtime.     No Known Allergies  Social History   Socioeconomic History   Marital status: Single    Spouse name: Not on file   Number of children: Not on file   Years of education: Not on file   Highest education level: Not on file  Occupational History   Occupation: cafeteria cook  Tobacco Use   Smoking status: Every Day    Packs/day: 0.20    Types: Cigarettes    Last attempt to quit: 03/24/2015    Years since quitting: 5.8   Smokeless tobacco: Never   Tobacco comments:    3-4 CIAGARETTES A DAY  Substance and Sexual Activity   Alcohol use: Yes    Alcohol/week: 0.0 standard drinks    Comment: 1-2 sixteen ounce beers daily.   Drug use: No   Sexual activity: Yes   Other Topics Concern   Not on file  Social History Narrative   Not on file   Social Determinants of Health   Financial Resource Strain: Not on file  Food Insecurity: Not on file  Transportation Needs: Not on file  Physical Activity: Not on file  Stress: Not on file  Social Connections: Not on file  Intimate Partner Violence: Not on file     Review of Systems: General: negative for chills, fever, night sweats or weight changes.  Cardiovascular: negative for chest pain, dyspnea on exertion, edema, orthopnea, palpitations, paroxysmal nocturnal dyspnea or shortness of breath Dermatological: negative for rash Respiratory: negative for cough or wheezing Urologic: negative for hematuria Abdominal: negative for nausea, vomiting, diarrhea, bright red blood per rectum, melena, or hematemesis Neurologic: negative for visual changes, syncope, or dizziness All other systems reviewed and are otherwise negative except as noted above.    Blood pressure 138/70, pulse 75, height 5\' 9"  (1.753 m), weight 129 lb 9.6 oz (58.8 kg), SpO2 99 %.  General appearance: alert and no distress Neck: no adenopathy, no carotid bruit, no JVD, supple, symmetrical, trachea midline, and thyroid not enlarged, symmetric, no tenderness/mass/nodules Lungs: clear to auscultation bilaterally Heart: regular rate and rhythm, S1, S2 normal, no murmur, click, rub or gallop Extremities: extremities normal, atraumatic, no cyanosis or edema Pulses: 2+ and symmetric Skin: Skin color, texture, turgor normal. No rashes or lesions Neurologic: Grossly normal  EKG sinus rhythm at 75 with septal Q waves.  There was also low limb voltage.  I personally reviewed this EKG.    ASSESSMENT AND PLAN:   Essential hypertension History of essential hypertension a blood pressure measured today 138/70.  He is on carvedilol, lisinopril and spironolactone.  Alcohol abuse History of ongoing alcohol abuse with 3-4 beers a  night.  Nonischemic cardiomyopathy ([place]) History of nonischemic cardiomyopathy with right and left heart cath performed by Dr. Martinique 02/24/2015 revealing normal coronary arteries and EF of 50% at that time with moderate pulmonary hypertension.  Subsequent 2D echo 7 revealed a decline in his EF to 15 to 20% most recently on 06/10/2018 with mild to moderate MR.  As result of this, he did have an ICD implanted by Dr. Curt Bears for primary prevention 07/18/2018.  There have been no discharges.  He is on lisinopril, carvedilol and spironolactone and is minimally symptomatic.  I am going to recheck a 2D echocardiogram and refer him to our Pharm.D.'s for initiation of Entresto and titration of his medications.  Tobacco abuse Ongoing tobacco abuse of 1/2 pack/day recalcitrant to receptor modification.     Pearletha Forge.  Gwenlyn Found MD [facility], [facility] 01/18/2021 10:48 AM

## 2021-01-21 NOTE — Progress Notes (Signed)
Cardiology Office Note Date:  01/23/2021  Patient ID:  Darin, Brady May 08, 1955, MRN [id number] PCP:  Dellia Cloud, MD  Cardiologist:  Dr. Allyson Sabal Electrophysiologist: Dr. Graciela Husbands    Chief Complaint:  9 mo   History of Present Illness: Darin Brady is a 65 y.o. male with history of NICM, ICD, chronic CHF (systolic), PVCs, HTN  He comes in today to be seen for Dr. Graciela Husbands, last seen by him Aug 2020, mentioned ongoing ETOH (2-16oz beers presumed daily, reported some lightheadedness after taking his medicines, lisinopril and aldactone moved to PM and his coreg reduced.  He more recently saw Dr. Allyson Sabal 01/18/21, still smoking and drinking unfortunately though all in all doing ok.  Planned for updated echo and RPH consult to switch to [facility]   TODAY He is doing quite well. 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   Device information MDT single chamber ICD implanted 07/18/2018   Past Medical History:  Diagnosis Date   Acute otitis media 04/01/2015   Alcohol abuse    Chronic combined systolic and diastolic CHF (congestive heart failure) (HCC)    a. 2D Echo 02/22/15 showed EF 20-25%, multiple WMA, grade 2 DD, aortic sclerosis, mild MR, severely dilated LA, cannot exclude PFO.   Cough 04/01/2015   Epigastric pain    Essential hypertension    Leukopenia    a. Noted on labs, unclear what prior w/u.   NICM (nonischemic cardiomyopathy) (HCC)    a. LHC 1/22016: normal cors.   Non-ischemic cardiomyopathy (HCC) 03/03/2015   Pulmonary hypertension (HCC)    a. moderate pulm HTN by cath 02/2015.   Syncope 07/08/2013   syncope    Tobacco abuse     Past Surgical History:  Procedure Laterality Date   CARDIAC CATHETERIZATION N/A 02/24/2015   Procedure: Right/Left Heart Cath and Coronary Angiography;  Surgeon: Peter M Swaziland, MD;  Location: [facility] INVASIVE  CV LAB;  Service: Cardiovascular;  Laterality: N/A;   CLAVICLE SURGERY     15 years ago   ICD IMPLANT N/A 07/18/2018   Procedure: ICD IMPLANT;  Surgeon: Regan Lemming, MD;  Location: [facility] INVASIVE CV LAB;  Service: Cardiovascular;  Laterality: N/A;   ORIF ANKLE FRACTURE  02/03/2011   Procedure: OPEN REDUCTION INTERNAL FIXATION (ORIF) ANKLE FRACTURE;  Surgeon: Thera Flake., MD;  Location: [facility] ORS;  Service: Orthopedics;  Laterality: Left;   STOMACH SURGERY     stop bleeding from a trauma    Current Outpatient Medications  Medication Sig Dispense Refill   albuterol (VENTOLIN HFA) 108 (90 Base) MCG/ACT inhaler Inhale 2 puffs into the lungs every 6 (six) hours as needed.     benzonatate (TESSALON) 100 MG capsule Take 100-200 mg by mouth every 8 (eight) hours.     carvedilol (COREG) 12.5 MG tablet Take 6.25mg , half tablet, in AM; Take 12.5mg , whole tablet, in PM 180 tablet 3   diclofenac Sodium (VOLTAREN) 1 % GEL SMARTSIG:4 Gram(s) Topical 4 Times Daily PRN     furosemide (LASIX) 20 MG tablet TAKE 20MG  DAILY AND AN EXTRA 20MG  AS NEEDED FOR FLUID RETENTION 90 tablet 3   lisinopril (ZESTRIL) 10 MG tablet Take 1 tablet (10 mg total) by mouth at bedtime. 90 tablet 3   sildenafil (VIAGRA) 100 MG tablet Take 100 mg by mouth daily as needed.     spironolactone (ALDACTONE)  25 MG tablet Take 1 tablet (25 mg total) by mouth at bedtime. 90 tablet 0   No current facility-administered medications for this visit.    Allergies:   Patient has no known allergies.   Social History:  The patient  reports that he has been smoking cigarettes. He has been smoking an average of .2 packs per day. He has never used smokeless tobacco. He reports current alcohol use. He reports that he does not use drugs.   Family History:  The patient's family history includes Asthma in his mother; Heart attack in his brother; Hypertension in his brother and mother.  ROS:  Please see the history of present illness.    All  other systems are reviewed and otherwise negative.   PHYSICAL EXAM:  VS:  BP 112/66   Pulse 79   Ht 5\' 9"  (1.753 m)   Wt 119 lb 12.8 oz (54.3 kg)   SpO2 98%   BMI 17.69 kg/m  BMI: Body mass index is 17.69 kg/m. Well nourished, well developed, in no acute distress HEENT: normocephalic, atraumatic Neck: no JVD, carotid bruits or masses Cardiac:  RRR; no significant murmurs, no rubs, or gallops Lungs:  CTA b/l, no wheezing, rhonchi or rales Abd: soft, nontender MS: no deformity or atrophy Ext:  no edema Skin: warm and dry, no rash Neuro:  No gross deficits appreciated Psych: euthymic mood, full affect  ICD site is stable, no tethering or discomfort, he is thin, skin is intact, no thinning   EKG:  not done today  Device interrogation done today and reviewed by myself:  Battery and lead measurements are good No arrhythmias No VP   06/10/2018: TTE IMPRESSIONS   1. The left ventricle has a visually estimated ejection fraction of  15-20%. The cavity size was moderately dilated. Left ventricular diastolic  Doppler parameters are indeterminate. Elevated left ventricular  end-diastolic pressure The E/e' is 06/12/2018.   2. The right ventricle has severely reduced systolic function. The cavity  was normal. There is no increase in right ventricular wall thickness.   3. The mitral valve is abnormal. Moderate thickening of the mitral valve  leaflet. Mitral valve regurgitation is mild to moderate by color flow  Doppler. 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   4. The aortic valve is tricuspid. Mild sclerosis of the aortic valve.   5. No intracardiac thrombi or masses were visualized.    02/24/2015: LHC There is severe left ventricular systolic dysfunction.   1. Normal coronary anatomy. 2. Severe LV dysfunction. EF estimated at 15%. 3. Moderate pulmonary HTN.     Recent Labs: 09/19/2020: ALT 18; BUN 15; Creat 1.26; Hemoglobin 11.7;  Platelets 247; Potassium 4.3; Sodium 130; TSH 0.62  09/19/2020: Cholesterol 127; HDL 72; LDL Cholesterol (Calc) 34; Total CHOL/HDL Ratio 1.8; Triglycerides 131   CrCl cannot be calculated (Patient's most recent lab result is older than the maximum 21 days allowed.).   Wt Readings from Last 3 Encounters:  01/23/21 119 lb 12.8 oz (54.3 kg)  01/18/21 129 lb 9.6 oz (58.8 kg)  02/09/20 147 lb (66.7 kg)     Other studies reviewed: Additional studies/records reviewed today include: summarized above  ASSESSMENT AND PLAN:  ICD Intact function No programming changes made  NICM (systolic) Chronic CHF On BB/ACE, aldactone, lasix OptiVol ,looks great Just saw Dr. 02/11/20    Disposition: F/u with remotes Q 67mo, in clinic with EP in 1 year, sooner if needed  Current medicines are reviewed at length with the patient today.  The patient did not have any concerns regarding medicines.  Norma Fredrickson, PA-C 01/23/2021 2:08 PM     [facility] [address] [place] [address] [phone] (office)  [phone] (fax)

## 2021-01-23 ENCOUNTER — Ambulatory Visit (INDEPENDENT_AMBULATORY_CARE_PROVIDER_SITE_OTHER): Payer: Medicare Other | Admitting: Physician Assistant

## 2021-01-23 ENCOUNTER — Ambulatory Visit (INDEPENDENT_AMBULATORY_CARE_PROVIDER_SITE_OTHER): Payer: Medicare Other

## 2021-01-23 ENCOUNTER — Other Ambulatory Visit: Payer: Self-pay

## 2021-01-23 ENCOUNTER — Encounter: Payer: Self-pay | Admitting: Physician Assistant

## 2021-01-23 VITALS — BP 112/66 | HR 79 | Ht 69.0 in | Wt 119.8 lb

## 2021-01-23 DIAGNOSIS — I428 Other cardiomyopathies: Secondary | ICD-10-CM | POA: Diagnosis not present

## 2021-01-23 DIAGNOSIS — Z95810 Presence of automatic (implantable) cardiac defibrillator: Secondary | ICD-10-CM

## 2021-01-23 DIAGNOSIS — I5022 Chronic systolic (congestive) heart failure: Secondary | ICD-10-CM | POA: Diagnosis not present

## 2021-01-23 LAB — CUP PACEART INCLINIC DEVICE CHECK
Battery Remaining Longevity: 121 mo
Battery Voltage: 3.01 V
Brady Statistic RV Percent Paced: 0.01 %
Date Time Interrogation Session: 20221114143656
HighPow Impedance: 68 Ohm
Implantable Lead Implant Date: 20200508
Implantable Lead Location: 753860
Implantable Lead Model: 6935
Implantable Pulse Generator Implant Date: 20200508
Lead Channel Impedance Value: 380 Ohm
Lead Channel Impedance Value: 456 Ohm
Lead Channel Pacing Threshold Amplitude: 0.5 V
Lead Channel Pacing Threshold Pulse Width: 0.4 ms
Lead Channel Sensing Intrinsic Amplitude: 11.75 mV
Lead Channel Sensing Intrinsic Amplitude: 13.375 mV
Lead Channel Setting Pacing Amplitude: 2.5 V
Lead Channel Setting Pacing Pulse Width: 0.4 ms
Lead Channel Setting Sensing Sensitivity: 0.3 mV

## 2021-01-23 LAB — CUP PACEART REMOTE DEVICE CHECK
Battery Remaining Longevity: 121 mo
Battery Voltage: 3.02 V
Brady Statistic RV Percent Paced: 0.01 %
Date Time Interrogation Session: 20221114001803
HighPow Impedance: 73 Ohm
Implantable Lead Implant Date: 20200508
Implantable Lead Location: 753860
Implantable Lead Model: 6935
Implantable Pulse Generator Implant Date: 20200508
Lead Channel Impedance Value: 380 Ohm
Lead Channel Impedance Value: 437 Ohm
Lead Channel Pacing Threshold Amplitude: 0.5 V
Lead Channel Pacing Threshold Pulse Width: 0.4 ms
Lead Channel Sensing Intrinsic Amplitude: 10.75 mV
Lead Channel Sensing Intrinsic Amplitude: 10.75 mV
Lead Channel Setting Pacing Amplitude: 2.5 V
Lead Channel Setting Pacing Pulse Width: 0.4 ms
Lead Channel Setting Sensing Sensitivity: 0.3 mV

## 2021-01-23 NOTE — Patient Instructions (Signed)
Medication Instructions:   Your physician recommends that you continue on your current medications as directed. Please refer to the Current Medication list given to you today.  *If you need a refill on your cardiac medications before your next appointment, please call your pharmacy*   Lab Work: NONE ORDERED  TODAY   If you have labs (blood work) drawn today and your tests are completely normal, you will receive your results only by: . MyChart Message (if you have MyChart) OR . A paper copy in the mail If you have any lab test that is abnormal or we need to change your treatment, we will call you to review the results.   Testing/Procedures: NONE ORDERED  TODAY   Follow-Up: At [facility], you and your health needs are our priority.  As part of our continuing mission to provide you with exceptional heart care, we have created designated Provider Care Teams.  These Care Teams include your primary Cardiologist (physician) and Advanced Practice Providers (APPs -  Physician Assistants and Nurse Practitioners) who all work together to provide you with the care you need, when you need it.  We recommend signing up for the patient portal called "MyChart".  Sign up information is provided on this After Visit Summary.  MyChart is used to connect with patients for Virtual Visits (Telemedicine).  Patients are able to view lab/test results, encounter notes, upcoming appointments, etc.  Non-urgent messages can be sent to your provider as well.   To learn more about what you can do with MyChart, go to [email or web address].    Your next appointment:   1 year(s)  The format for your next appointment:   In Person  Provider:   Steven Klein, MD   Other Instructions   

## 2021-01-30 NOTE — Progress Notes (Signed)
Remote ICD transmission.   

## 2021-02-06 ENCOUNTER — Ambulatory Visit: Payer: Medicare Other

## 2021-02-06 NOTE — Progress Notes (Unsigned)
-   was smoking 2 cigarettes a day on 11/09 with Dr. Allyson Sabal  - drinks 2-3 beers/day 4 days/week - ask has he had any episodes of syncope  - ask about chest pain, SOB, waking up out of sleep to breathe, appetite

## 2021-02-17 ENCOUNTER — Other Ambulatory Visit (HOSPITAL_COMMUNITY): Payer: Medicare Other

## 2021-02-17 ENCOUNTER — Encounter (HOSPITAL_COMMUNITY): Payer: Self-pay | Admitting: Cardiovascular Disease

## 2021-03-01 ENCOUNTER — Ambulatory Visit: Payer: Medicare Other

## 2021-03-01 NOTE — Progress Notes (Deleted)
Patient ID: Keylor Rands                 DOB: 1956-02-17                      MRN: [id number]     HPI: Xhaiden Coombs is a 65 y.o. male referred by Dr. Allyson Sabal to pharmacy clinic for HF medication management. PMH is significant for CHF, ICD, HTN, tobacco abuse, EtOH abuse, and PVCs. Most recent LVEF 15-20% on 06/10/2018.  Today she returns to pharmacy clinic for further medication titration. At last visit with MD ***. Symptomatically, she is feeling ***, *** dizziness, lightheadedness, and fatigue. *** chest pain or palpitations. Feels SOB when ***. Able to complete all ADLs. Activity level ***. She *** checks her weight at home (normal range *** - *** lbs). *** LEE, PND, or orthopnea. Appetite has been ***. She *** adheres to a low-salt diet.  Current CHF meds:  Lisinopril 10mg  daily Furosemide 20mg  daily Carvedilol 6.25mg  in morning, 12.5mg  in evening Previously tried:  BP goal:   Family History:   Social History:   Diet:   Exercise:   Home BP readings:   Wt Readings from Last 3 Encounters:  01/23/21 119 lb 12.8 oz (54.3 kg)  01/18/21 129 lb 9.6 oz (58.8 kg)  02/09/20 147 lb (66.7 kg)   BP Readings from Last 3 Encounters:  01/23/21 112/66  01/18/21 138/70  07/14/19 102/74   Pulse Readings from Last 3 Encounters:  01/23/21 79  01/18/21 75  07/14/19 81    Renal function: CrCl cannot be calculated (Patient's most recent lab result is older than the maximum 21 days allowed.).  Past Medical History:  Diagnosis Date   Acute otitis media 04/01/2015   Alcohol abuse    Chronic combined systolic and diastolic CHF (congestive heart failure) (HCC)    a. 2D Echo 02/22/15 showed EF 20-25%, multiple WMA, grade 2 DD, aortic sclerosis, mild MR, severely dilated LA, cannot exclude PFO.   Cough 04/01/2015   Epigastric pain    Essential hypertension    Leukopenia    a. Noted on labs, unclear what prior w/u.   NICM (nonischemic cardiomyopathy) (HCC)    a. LHC 1/22016: normal cors.    Non-ischemic cardiomyopathy (HCC) 03/03/2015   Pulmonary hypertension (HCC)    a. moderate pulm HTN by cath 02/2015.   Syncope 07/08/2013   syncope    Tobacco abuse     Current Outpatient Medications on File Prior to Visit  Medication Sig Dispense Refill   albuterol (VENTOLIN HFA) 108 (90 Base) MCG/ACT inhaler Inhale 2 puffs into the lungs every 6 (six) hours as needed.     benzonatate (TESSALON) 100 MG capsule Take 100-200 mg by mouth every 8 (eight) hours.     carvedilol (COREG) 12.5 MG tablet Take 6.25mg , half tablet, in AM; Take 12.5mg , whole tablet, in PM 180 tablet 3   diclofenac Sodium (VOLTAREN) 1 % GEL SMARTSIG:4 Gram(s) Topical 4 Times Daily PRN     furosemide (LASIX) 20 MG tablet TAKE 20MG  DAILY AND AN EXTRA 20MG  AS NEEDED FOR FLUID RETENTION 90 tablet 3   lisinopril (ZESTRIL) 10 MG tablet Take 1 tablet (10 mg total) by mouth at bedtime. 90 tablet 3   sildenafil (VIAGRA) 100 MG tablet Take 100 mg by mouth daily as needed.     spironolactone (ALDACTONE) 25 MG tablet Take 1 tablet (25 mg total) by mouth at bedtime. 90 tablet 0   No current  facility-administered medications on file prior to visit.    No Known Allergies   Assessment/Plan:  1. CHF -

## 2021-03-02 ENCOUNTER — Ambulatory Visit: Payer: Medicare Other

## 2021-03-02 NOTE — Progress Notes (Deleted)
Patient ID: Darin Brady                 DOB: Oct 04, 1955                      MRN: [id number]     HPI: Darin Brady is a 65 y.o. male referred by Dr. Allyson Brady to pharmacy clinic for HF medication management. PMH is significant for ***. Most recent LVEF *** on ***.  Today she returns to pharmacy clinic for further medication titration. At last visit with MD ***. Symptomatically, she is feeling ***, *** dizziness, lightheadedness, and fatigue. *** chest pain or palpitations. Feels SOB when ***. Able to complete all ADLs. Activity level ***. She *** checks her weight at home (normal range *** - *** lbs). *** LEE, PND, or orthopnea. Appetite has been ***. She *** adheres to a low-salt diet.  Current CHF meds:  Previously tried:  BP goal:   Family History:   Social History:   Diet:   Exercise:   Home BP readings:   Wt Readings from Last 3 Encounters:  01/23/21 119 lb 12.8 oz (54.3 kg)  01/18/21 129 lb 9.6 oz (58.8 kg)  02/09/20 147 lb (66.7 kg)   BP Readings from Last 3 Encounters:  01/23/21 112/66  01/18/21 138/70  07/14/19 102/74   Pulse Readings from Last 3 Encounters:  01/23/21 79  01/18/21 75  07/14/19 81    Renal function: CrCl cannot be calculated (Patient's most recent lab result is older than the maximum 21 days allowed.).  Past Medical History:  Diagnosis Date   Acute otitis media 04/01/2015   Alcohol abuse    Chronic combined systolic and diastolic CHF (congestive heart failure) (HCC)    a. 2D Echo 02/22/15 showed EF 20-25%, multiple WMA, grade 2 DD, aortic sclerosis, mild MR, severely dilated LA, cannot exclude PFO.   Cough 04/01/2015   Epigastric pain    Essential hypertension    Leukopenia    a. Noted on labs, unclear what prior w/u.   NICM (nonischemic cardiomyopathy) (HCC)    a. LHC 1/22016: normal cors.   Non-ischemic cardiomyopathy (HCC) 03/03/2015   Pulmonary hypertension (HCC)    a. moderate pulm HTN by cath 02/2015.   Syncope 07/08/2013    syncope    Tobacco abuse     Current Outpatient Medications on File Prior to Visit  Medication Sig Dispense Refill   albuterol (VENTOLIN HFA) 108 (90 Base) MCG/ACT inhaler Inhale 2 puffs into the lungs every 6 (six) hours as needed.     benzonatate (TESSALON) 100 MG capsule Take 100-200 mg by mouth every 8 (eight) hours.     carvedilol (COREG) 12.5 MG tablet Take 6.25mg , half tablet, in AM; Take 12.5mg , whole tablet, in PM 180 tablet 3   diclofenac Sodium (VOLTAREN) 1 % GEL SMARTSIG:4 Gram(s) Topical 4 Times Daily PRN     furosemide (LASIX) 20 MG tablet TAKE 20MG  DAILY AND AN EXTRA 20MG  AS NEEDED FOR FLUID RETENTION 90 tablet 3   lisinopril (ZESTRIL) 10 MG tablet Take 1 tablet (10 mg total) by mouth at bedtime. 90 tablet 3   sildenafil (VIAGRA) 100 MG tablet Take 100 mg by mouth daily as needed.     spironolactone (ALDACTONE) 25 MG tablet Take 1 tablet (25 mg total) by mouth at bedtime. 90 tablet 0   No current facility-administered medications on file prior to visit.    No Known Allergies   Assessment/Plan:  1. CHF -

## 2021-03-07 ENCOUNTER — Ambulatory Visit: Payer: Medicare Other

## 2021-03-07 NOTE — Progress Notes (Deleted)
° ° ° °  03/07/2021 Darin Brady [birth year] [id number]   HPI:  Darin Brady is a 65 y.o. male patient of Dr Allyson Sabal, with a PMH below who presents today for hypertension clinic evaluation.  Past Medical History:                   Blood Pressure Goal:  130/80  Current Medications:  Family Hx:  Social Hx:   Diet:   Exercise:   Home BP readings:   Intolerances:   Labs:    Wt Readings from Last 3 Encounters:  01/23/21 119 lb 12.8 oz (54.3 kg)  01/18/21 129 lb 9.6 oz (58.8 kg)  02/09/20 147 lb (66.7 kg)   BP Readings from Last 3 Encounters:  01/23/21 112/66  01/18/21 138/70  07/14/19 102/74   Pulse Readings from Last 3 Encounters:  01/23/21 79  01/18/21 75  07/14/19 81    Current Outpatient Medications  Medication Sig Dispense Refill   albuterol (VENTOLIN HFA) 108 (90 Base) MCG/ACT inhaler Inhale 2 puffs into the lungs every 6 (six) hours as needed.     benzonatate (TESSALON) 100 MG capsule Take 100-200 mg by mouth every 8 (eight) hours.     carvedilol (COREG) 12.5 MG tablet Take 6.25mg , half tablet, in AM; Take 12.5mg , whole tablet, in PM 180 tablet 3   diclofenac Sodium (VOLTAREN) 1 % GEL SMARTSIG:4 Gram(s) Topical 4 Times Daily PRN     furosemide (LASIX) 20 MG tablet TAKE 20MG  DAILY AND AN EXTRA 20MG  AS NEEDED FOR FLUID RETENTION 90 tablet 3   lisinopril (ZESTRIL) 10 MG tablet Take 1 tablet (10 mg total) by mouth at bedtime. 90 tablet 3   sildenafil (VIAGRA) 100 MG tablet Take 100 mg by mouth daily as needed.     spironolactone (ALDACTONE) 25 MG tablet Take 1 tablet (25 mg total) by mouth at bedtime. 90 tablet 0   No current facility-administered medications for this visit.    No Known Allergies  Past Medical History:  Diagnosis Date   Acute otitis media 04/01/2015   Alcohol abuse    Chronic combined systolic and diastolic CHF (congestive heart failure) (HCC)    a. 2D Echo 02/22/15 showed EF 20-25%, multiple WMA, grade 2 DD, aortic sclerosis, mild  MR, severely dilated LA, cannot exclude PFO.   Cough 04/01/2015   Epigastric pain    Essential hypertension    Leukopenia    a. Noted on labs, unclear what prior w/u.   NICM (nonischemic cardiomyopathy) (HCC)    a. LHC 1/22016: normal cors.   Non-ischemic cardiomyopathy (HCC) 03/03/2015   Pulmonary hypertension (HCC)    a. moderate pulm HTN by cath 02/2015.   Syncope 07/08/2013   syncope    Tobacco abuse     There were no vitals taken for this visit.  No problem-specific Assessment & Plan notes found for this encounter.   03/2015 PharmD CPP [facility] [address] [place], [address] [place] [phone]

## 2021-03-20 ENCOUNTER — Other Ambulatory Visit (HOSPITAL_COMMUNITY): Payer: Medicare Other

## 2021-04-24 ENCOUNTER — Ambulatory Visit (INDEPENDENT_AMBULATORY_CARE_PROVIDER_SITE_OTHER): Payer: Medicare Other

## 2021-04-24 DIAGNOSIS — I428 Other cardiomyopathies: Secondary | ICD-10-CM | POA: Diagnosis not present

## 2021-04-24 LAB — CUP PACEART REMOTE DEVICE CHECK
Battery Remaining Longevity: 119 mo
Battery Voltage: 3.01 V
Brady Statistic RV Percent Paced: 0.01 %
Date Time Interrogation Session: 20230213093428
HighPow Impedance: 64 Ohm
Implantable Lead Implant Date: 20200508
Implantable Lead Location: 753860
Implantable Lead Model: 6935
Implantable Pulse Generator Implant Date: 20200508
Lead Channel Impedance Value: 380 Ohm
Lead Channel Impedance Value: 456 Ohm
Lead Channel Pacing Threshold Amplitude: 0.625 V
Lead Channel Pacing Threshold Pulse Width: 0.4 ms
Lead Channel Sensing Intrinsic Amplitude: 11 mV
Lead Channel Sensing Intrinsic Amplitude: 11 mV
Lead Channel Setting Pacing Amplitude: 2.5 V
Lead Channel Setting Pacing Pulse Width: 0.4 ms
Lead Channel Setting Sensing Sensitivity: 0.3 mV

## 2021-04-26 NOTE — Progress Notes (Signed)
Remote ICD transmission.   

## 2021-07-14 ENCOUNTER — Other Ambulatory Visit: Payer: Self-pay | Admitting: Internal Medicine

## 2021-07-15 LAB — CBC
HCT: 34.1 % — ABNORMAL LOW (ref 38.5–50.0)
Hemoglobin: 11.4 g/dL — ABNORMAL LOW (ref 13.2–17.1)
MCH: 32.9 pg (ref 27.0–33.0)
MCHC: 33.4 g/dL (ref 32.0–36.0)
MCV: 98.6 fL (ref 80.0–100.0)
MPV: 9.4 fL (ref 7.5–12.5)
Platelets: 297 10*3/uL (ref 140–400)
RBC: 3.46 10*6/uL — ABNORMAL LOW (ref 4.20–5.80)
RDW: 13.1 % (ref 11.0–15.0)
WBC: 4.7 10*3/uL (ref 3.8–10.8)

## 2021-07-15 LAB — COMPLETE METABOLIC PANEL WITH GFR
AG Ratio: 1.5 (calc) (ref 1.0–2.5)
ALT: 15 U/L (ref 9–46)
AST: 18 U/L (ref 10–35)
Albumin: 4.4 g/dL (ref 3.6–5.1)
Alkaline phosphatase (APISO): 56 U/L (ref 35–144)
BUN/Creatinine Ratio: 16 (calc) (ref 6–22)
BUN: 29 mg/dL — ABNORMAL HIGH (ref 7–25)
CO2: 18 mmol/L — ABNORMAL LOW (ref 20–32)
Calcium: 9.1 mg/dL (ref 8.6–10.3)
Chloride: 107 mmol/L (ref 98–110)
Creat: 1.79 mg/dL — ABNORMAL HIGH (ref 0.70–1.35)
Globulin: 3 g/dL (calc) (ref 1.9–3.7)
Glucose, Bld: 82 mg/dL (ref 65–99)
Potassium: 6.8 mmol/L (ref 3.5–5.3)
Sodium: 133 mmol/L — ABNORMAL LOW (ref 135–146)
Total Bilirubin: 0.5 mg/dL (ref 0.2–1.2)
Total Protein: 7.4 g/dL (ref 6.1–8.1)
eGFR: 41 mL/min/{1.73_m2} — ABNORMAL LOW (ref >=60)

## 2021-07-15 LAB — LIPID PANEL
Cholesterol: 146 mg/dL (ref <200)
HDL: 41 mg/dL (ref >=40)
LDL Cholesterol (Calc): 81 mg/dL (calc)
Non-HDL Cholesterol (Calc): 105 mg/dL (calc) (ref <130)
Total CHOL/HDL Ratio: 3.6 (calc) (ref <5.0)
Triglycerides: 138 mg/dL (ref <150)

## 2021-07-15 LAB — VITAMIN D 25 HYDROXY (VIT D DEFICIENCY, FRACTURES): Vit D, 25-Hydroxy: 23 ng/mL — ABNORMAL LOW (ref 30–100)

## 2021-07-15 LAB — PSA: PSA: 1.41 ng/mL (ref <=4.00)

## 2021-07-15 LAB — TSH: TSH: 0.37 mIU/L — ABNORMAL LOW (ref 0.40–4.50)

## 2021-07-19 ENCOUNTER — Other Ambulatory Visit: Payer: Self-pay | Admitting: Internal Medicine

## 2021-07-20 LAB — BASIC METABOLIC PANEL WITH GFR
BUN/Creatinine Ratio: 17 (calc) (ref 6–22)
BUN: 26 mg/dL — ABNORMAL HIGH (ref 7–25)
CO2: 18 mmol/L — ABNORMAL LOW (ref 20–32)
Calcium: 9.5 mg/dL (ref 8.6–10.3)
Chloride: 107 mmol/L (ref 98–110)
Creat: 1.57 mg/dL — ABNORMAL HIGH (ref 0.70–1.35)
Glucose, Bld: 80 mg/dL (ref 65–99)
Potassium: 5.9 mmol/L — ABNORMAL HIGH (ref 3.5–5.3)
Sodium: 135 mmol/L (ref 135–146)
eGFR: 48 mL/min/{1.73_m2} — ABNORMAL LOW (ref >=60)

## 2021-07-20 LAB — EXTRA LAV TOP TUBE

## 2021-07-24 ENCOUNTER — Ambulatory Visit (INDEPENDENT_AMBULATORY_CARE_PROVIDER_SITE_OTHER): Payer: Medicare Other

## 2021-07-24 DIAGNOSIS — I428 Other cardiomyopathies: Secondary | ICD-10-CM

## 2021-07-25 LAB — CUP PACEART REMOTE DEVICE CHECK
Battery Remaining Longevity: 116 mo
Battery Voltage: 3.01 V
Brady Statistic RV Percent Paced: 0.01 %
Date Time Interrogation Session: 20230515001804
HighPow Impedance: 69 Ohm
Implantable Lead Implant Date: 20200508
Implantable Lead Location: 753860
Implantable Lead Model: 6935
Implantable Pulse Generator Implant Date: 20200508
Lead Channel Impedance Value: 380 Ohm
Lead Channel Impedance Value: 494 Ohm
Lead Channel Pacing Threshold Amplitude: 0.625 V
Lead Channel Pacing Threshold Pulse Width: 0.4 ms
Lead Channel Sensing Intrinsic Amplitude: 12 mV
Lead Channel Sensing Intrinsic Amplitude: 12 mV
Lead Channel Setting Pacing Amplitude: 2.5 V
Lead Channel Setting Pacing Pulse Width: 0.4 ms
Lead Channel Setting Sensing Sensitivity: 0.3 mV

## 2021-08-10 NOTE — Progress Notes (Signed)
Remote ICD transmission.   

## 2021-09-02 ENCOUNTER — Encounter: Payer: Self-pay | Admitting: *Deleted

## 2021-10-04 ENCOUNTER — Other Ambulatory Visit: Payer: Self-pay | Admitting: Cardiovascular Disease

## 2021-10-23 ENCOUNTER — Ambulatory Visit: Payer: Self-pay

## 2021-10-24 LAB — CUP PACEART REMOTE DEVICE CHECK
Battery Remaining Longevity: 112 mo
Battery Voltage: 3.01 V
Brady Statistic RV Percent Paced: 0.01 %
Date Time Interrogation Session: 20230814222503
HighPow Impedance: 67 Ohm
Implantable Lead Implant Date: 20200508
Implantable Lead Location: 753860
Implantable Lead Model: 6935
Implantable Pulse Generator Implant Date: 20200508
Lead Channel Impedance Value: 399 Ohm
Lead Channel Impedance Value: 456 Ohm
Lead Channel Pacing Threshold Amplitude: 0.5 V
Lead Channel Pacing Threshold Pulse Width: 0.4 ms
Lead Channel Sensing Intrinsic Amplitude: 10.75 mV
Lead Channel Sensing Intrinsic Amplitude: 10.75 mV
Lead Channel Setting Pacing Amplitude: 2.5 V
Lead Channel Setting Pacing Pulse Width: 0.4 ms
Lead Channel Setting Sensing Sensitivity: 0.3 mV

## 2021-12-29 ENCOUNTER — Other Ambulatory Visit: Payer: Self-pay | Admitting: Internal Medicine

## 2021-12-30 LAB — CELL COUNT + DIFF, W/O CRYST-SYNVL FLD
Basophils, %: 0 %
Eosinophils-Synovial: 0 % (ref 0–2)
Lymphocytes-Synovial Fld: 76 % — ABNORMAL HIGH (ref 0–74)
Monocyte/Macrophage: 8 % (ref 0–69)
Neutrophil, Synovial: 15 % (ref 0–24)
Synoviocytes, %: 1 % (ref 0–15)
WBC, Synovial: 490 cells/uL — ABNORMAL HIGH (ref <150)

## 2022-01-13 ENCOUNTER — Other Ambulatory Visit: Payer: Self-pay | Admitting: Cardiovascular Disease

## 2022-01-22 ENCOUNTER — Ambulatory Visit (INDEPENDENT_AMBULATORY_CARE_PROVIDER_SITE_OTHER): Payer: Medicare Other

## 2022-01-22 DIAGNOSIS — I5022 Chronic systolic (congestive) heart failure: Secondary | ICD-10-CM

## 2022-01-22 DIAGNOSIS — I428 Other cardiomyopathies: Secondary | ICD-10-CM | POA: Diagnosis not present

## 2022-01-23 LAB — CUP PACEART REMOTE DEVICE CHECK
Battery Remaining Longevity: 109 mo
Battery Voltage: 3.01 V
Brady Statistic RV Percent Paced: 0.01 %
Date Time Interrogation Session: 20231113183423
HighPow Impedance: 75 Ohm
Implantable Lead Connection Status: 753985
Implantable Lead Implant Date: 20200508
Implantable Lead Location: 753860
Implantable Lead Model: 6935
Implantable Pulse Generator Implant Date: 20200508
Lead Channel Impedance Value: 399 Ohm
Lead Channel Impedance Value: 494 Ohm
Lead Channel Pacing Threshold Amplitude: 0.625 V
Lead Channel Pacing Threshold Pulse Width: 0.4 ms
Lead Channel Sensing Intrinsic Amplitude: 11 mV
Lead Channel Sensing Intrinsic Amplitude: 11 mV
Lead Channel Setting Pacing Amplitude: 2.5 V
Lead Channel Setting Pacing Pulse Width: 0.4 ms
Lead Channel Setting Sensing Sensitivity: 0.3 mV
Zone Setting Status: 755011
Zone Setting Status: 755011

## 2022-03-01 NOTE — Progress Notes (Signed)
Remote ICD transmission.   

## 2022-04-23 ENCOUNTER — Ambulatory Visit (INDEPENDENT_AMBULATORY_CARE_PROVIDER_SITE_OTHER): Payer: 59

## 2022-04-23 DIAGNOSIS — I428 Other cardiomyopathies: Secondary | ICD-10-CM | POA: Diagnosis not present

## 2022-04-23 DIAGNOSIS — I5022 Chronic systolic (congestive) heart failure: Secondary | ICD-10-CM

## 2022-04-25 LAB — CUP PACEART REMOTE DEVICE CHECK
Battery Remaining Longevity: 106 mo
Battery Voltage: 3.01 V
Brady Statistic RV Percent Paced: 0 %
Date Time Interrogation Session: 20240213232824
HighPow Impedance: 74 Ohm
Implantable Lead Connection Status: 753985
Implantable Lead Implant Date: 20200508
Implantable Lead Location: 753860
Implantable Lead Model: 6935
Implantable Pulse Generator Implant Date: 20200508
Lead Channel Impedance Value: 380 Ohm
Lead Channel Impedance Value: 456 Ohm
Lead Channel Pacing Threshold Amplitude: 0.75 V
Lead Channel Pacing Threshold Pulse Width: 0.4 ms
Lead Channel Sensing Intrinsic Amplitude: 12.25 mV
Lead Channel Sensing Intrinsic Amplitude: 12.25 mV
Lead Channel Setting Pacing Amplitude: 2.5 V
Lead Channel Setting Pacing Pulse Width: 0.4 ms
Lead Channel Setting Sensing Sensitivity: 0.3 mV
Zone Setting Status: 755011
Zone Setting Status: 755011

## 2022-06-05 NOTE — Progress Notes (Signed)
Remote ICD transmission.   

## 2022-07-23 ENCOUNTER — Ambulatory Visit (INDEPENDENT_AMBULATORY_CARE_PROVIDER_SITE_OTHER): Payer: 59

## 2022-07-23 DIAGNOSIS — I428 Other cardiomyopathies: Secondary | ICD-10-CM

## 2022-07-23 DIAGNOSIS — I5022 Chronic systolic (congestive) heart failure: Secondary | ICD-10-CM | POA: Diagnosis not present

## 2022-07-25 LAB — CUP PACEART REMOTE DEVICE CHECK
Battery Remaining Longevity: 103 mo
Battery Voltage: 2.99 V
Brady Statistic RV Percent Paced: 0 %
Date Time Interrogation Session: 20240514213429
HighPow Impedance: 74 Ohm
Implantable Lead Connection Status: 753985
Implantable Lead Implant Date: 20200508
Implantable Lead Location: 753860
Implantable Lead Model: 6935
Implantable Pulse Generator Implant Date: 20200508
Lead Channel Impedance Value: 399 Ohm
Lead Channel Impedance Value: 494 Ohm
Lead Channel Pacing Threshold Amplitude: 0.625 V
Lead Channel Pacing Threshold Pulse Width: 0.4 ms
Lead Channel Sensing Intrinsic Amplitude: 10.5 mV
Lead Channel Sensing Intrinsic Amplitude: 10.5 mV
Lead Channel Setting Pacing Amplitude: 2.5 V
Lead Channel Setting Pacing Pulse Width: 0.4 ms
Lead Channel Setting Sensing Sensitivity: 0.3 mV
Zone Setting Status: 755011
Zone Setting Status: 755011

## 2022-08-16 NOTE — Progress Notes (Signed)
Remote ICD transmission.   

## 2022-09-26 ENCOUNTER — Other Ambulatory Visit: Payer: Self-pay | Admitting: Cardiovascular Disease

## 2022-10-12 ENCOUNTER — Ambulatory Visit (INDEPENDENT_AMBULATORY_CARE_PROVIDER_SITE_OTHER): Payer: 59 | Admitting: Podiatry

## 2022-10-12 ENCOUNTER — Encounter: Payer: Self-pay | Admitting: Podiatry

## 2022-10-12 DIAGNOSIS — M7751 Other enthesopathy of right foot: Secondary | ICD-10-CM | POA: Diagnosis not present

## 2022-10-12 DIAGNOSIS — M7752 Other enthesopathy of left foot: Secondary | ICD-10-CM

## 2022-10-12 MED ORDER — TRIAMCINOLONE ACETONIDE 10 MG/ML IJ SUSP
10.0000 mg | Freq: Once | INTRAMUSCULAR | Status: AC
  Administered 2022-10-12: 10 mg via INTRA_ARTICULAR

## 2022-10-15 NOTE — Progress Notes (Signed)
Subjective:   Patient ID: Darin Brady, male   DOB: 67 y.o.   MRN: [id number]   HPI Patient presents with a lot of pain in the fifth metatarsal head of both feet states it has been bothering him now for a few months and has had it worked on in the past with success   ROS      Objective:  Physical Exam  Neurovascular status intact inflammation pain around the fifth MPJ bilateral fluid buildup around the joint surfaces     Assessment:  Inflammatory capsulitis fifth MPJ bilateral with pain     Plan:  H&P reviewed sterile prep injected the capsule of the fifth MPJ bilateral 3 mg dexamethasone Kenalog 5 mg Xylocaine advised on supportive shoes reappoint to recheck as needed

## 2022-10-22 ENCOUNTER — Ambulatory Visit: Payer: 59

## 2022-10-22 DIAGNOSIS — I428 Other cardiomyopathies: Secondary | ICD-10-CM | POA: Diagnosis not present

## 2022-11-05 NOTE — Progress Notes (Signed)
Remote ICD transmission.   

## 2022-12-25 ENCOUNTER — Other Ambulatory Visit: Payer: Self-pay | Admitting: Cardiovascular Disease

## 2022-12-28 ENCOUNTER — Other Ambulatory Visit: Payer: Self-pay | Admitting: Cardiovascular Disease

## 2023-01-17 ENCOUNTER — Other Ambulatory Visit: Payer: Self-pay | Admitting: Cardiovascular Disease

## 2023-01-21 ENCOUNTER — Ambulatory Visit (INDEPENDENT_AMBULATORY_CARE_PROVIDER_SITE_OTHER): Payer: 59

## 2023-01-21 DIAGNOSIS — I428 Other cardiomyopathies: Secondary | ICD-10-CM

## 2023-01-21 DIAGNOSIS — I5022 Chronic systolic (congestive) heart failure: Secondary | ICD-10-CM

## 2023-01-22 LAB — CUP PACEART REMOTE DEVICE CHECK
Battery Remaining Longevity: 95 mo
Battery Voltage: 3 V
Brady Statistic RV Percent Paced: 0 %
Date Time Interrogation Session: 20241111012406
HighPow Impedance: 70 Ohm
Implantable Lead Connection Status: 753985
Implantable Lead Implant Date: 20200508
Implantable Lead Location: 753860
Implantable Lead Model: 6935
Implantable Pulse Generator Implant Date: 20200508
Lead Channel Impedance Value: 399 Ohm
Lead Channel Impedance Value: 494 Ohm
Lead Channel Pacing Threshold Amplitude: 0.625 V
Lead Channel Pacing Threshold Pulse Width: 0.4 ms
Lead Channel Sensing Intrinsic Amplitude: 8.25 mV
Lead Channel Sensing Intrinsic Amplitude: 8.25 mV
Lead Channel Setting Pacing Amplitude: 2.5 V
Lead Channel Setting Pacing Pulse Width: 0.4 ms
Lead Channel Setting Sensing Sensitivity: 0.3 mV
Zone Setting Status: 755011
Zone Setting Status: 755011

## 2023-02-13 NOTE — Progress Notes (Signed)
Remote ICD transmission.   

## 2023-02-18 ENCOUNTER — Other Ambulatory Visit: Payer: Self-pay | Admitting: Cardiovascular Disease

## 2023-04-22 ENCOUNTER — Ambulatory Visit (INDEPENDENT_AMBULATORY_CARE_PROVIDER_SITE_OTHER): Payer: 59

## 2023-04-22 DIAGNOSIS — I428 Other cardiomyopathies: Secondary | ICD-10-CM

## 2023-04-22 DIAGNOSIS — I5022 Chronic systolic (congestive) heart failure: Secondary | ICD-10-CM

## 2023-04-23 LAB — CUP PACEART REMOTE DEVICE CHECK
Battery Remaining Longevity: 92 mo
Battery Voltage: 3 V
Brady Statistic RV Percent Paced: 0 %
Date Time Interrogation Session: 20250210223727
HighPow Impedance: 72 Ohm
Implantable Lead Connection Status: 753985
Implantable Lead Implant Date: 20200508
Implantable Lead Location: 753860
Implantable Lead Model: 6935
Implantable Pulse Generator Implant Date: 20200508
Lead Channel Impedance Value: 399 Ohm
Lead Channel Impedance Value: 494 Ohm
Lead Channel Pacing Threshold Amplitude: 0.75 V
Lead Channel Pacing Threshold Pulse Width: 0.4 ms
Lead Channel Sensing Intrinsic Amplitude: 12.25 mV
Lead Channel Sensing Intrinsic Amplitude: 12.25 mV
Lead Channel Setting Pacing Amplitude: 2.5 V
Lead Channel Setting Pacing Pulse Width: 0.4 ms
Lead Channel Setting Sensing Sensitivity: 0.3 mV
Zone Setting Status: 755011
Zone Setting Status: 755011

## 2023-04-24 ENCOUNTER — Telehealth: Payer: Self-pay

## 2023-04-24 NOTE — Telephone Encounter (Signed)
Scheduled remote reviewed. Normal device function.   HF diagnostics currently abnormal, routed to triage per protocol.  1 NSVT, 11 beats at 213 bpm.  Patient is past due for annual follow up with Dr. Graciela Husbands. Needs appointment.    Patient not taking his Lasix everyday.  Says some SOB in recent weeks with exertion, denies s/s of edema.   I have told him to watch salt and take his Lasix daily as prescribed and we will see him in clinic on 04/30/23 with A. Tillery, PA-C for follow up.

## 2023-04-29 NOTE — Progress Notes (Unsigned)
  Electrophysiology Office Note:   ID:  Darin Brady, Darin Brady [birth year], MRN [id number]  Primary Cardiologist: Nanetta Batty, MD Electrophysiologist: Sherryl Manges, MD  {Click to update primary MD,subspecialty MD or APP then REFRESH:1}    History of Present Illness:   Darin Brady is a 68 y.o. male with h/o chronic systolic CHF and NICM seen today for routine electrophysiology followup.   Since last being seen in our clinic the patient reports doing ***.  he denies chest pain, palpitations, dyspnea, PND, orthopnea, nausea, vomiting, dizziness, syncope, edema, weight gain, or early satiety.   Review of systems complete and found to be negative unless listed in HPI.   EP Information / Studies Reviewed:    EKG is ordered today. Personal review as below.       ICD Interrogation-  reviewed in detail today,  See PACEART report.  Arrhythmia/Device History MDT single chamber ICD implanted 07/18/2018   Physical Exam:   VS:  There were no vitals taken for this visit.   Wt Readings from Last 3 Encounters:  01/23/21 119 lb 12.8 oz (54.3 kg)  01/18/21 129 lb 9.6 oz (58.8 kg)  02/09/20 147 lb (66.7 kg)     GEN: No acute distress *** NECK: No JVD; No carotid bruits CARDIAC: {EPRHYTHM:28826}, no murmurs, rubs, gallops RESPIRATORY:  Clear to auscultation without rales, wheezing or rhonchi  ABDOMEN: Soft, non-tender, non-distended EXTREMITIES:  {EDEMA LEVEL:28147::"No"} edema; No deformity   ASSESSMENT AND PLAN:    Chronic systolic CHF  s/p Medtronic single chamber ICD  NICM euvolemic today Stable on an appropriate medical regimen Normal ICD function See Pace Art report No changes today Continue GDMT per gen cards  Disposition:   Follow up with {EPPROVIDERS:28135} {EPFOLLOW UP:28173}   Signed, Graciella Freer, PA-C

## 2023-04-30 ENCOUNTER — Ambulatory Visit: Payer: 59 | Attending: Student | Admitting: Student

## 2023-04-30 ENCOUNTER — Encounter: Payer: Self-pay | Admitting: Student

## 2023-04-30 VITALS — BP 100/64 | HR 86 | Ht 69.0 in | Wt 127.4 lb

## 2023-04-30 DIAGNOSIS — I5022 Chronic systolic (congestive) heart failure: Secondary | ICD-10-CM | POA: Diagnosis not present

## 2023-04-30 DIAGNOSIS — I428 Other cardiomyopathies: Secondary | ICD-10-CM | POA: Diagnosis not present

## 2023-04-30 LAB — CUP PACEART INCLINIC DEVICE CHECK
Battery Remaining Longevity: 92 mo
Battery Voltage: 3 V
Brady Statistic RV Percent Paced: 0.01 %
Date Time Interrogation Session: 20250218100846
HighPow Impedance: 62 Ohm
Implantable Lead Connection Status: 753985
Implantable Lead Implant Date: 20200508
Implantable Lead Location: 753860
Implantable Lead Model: 6935
Implantable Pulse Generator Implant Date: 20200508
Lead Channel Impedance Value: 399 Ohm
Lead Channel Impedance Value: 513 Ohm
Lead Channel Pacing Threshold Amplitude: 0.625 V
Lead Channel Pacing Threshold Pulse Width: 0.4 ms
Lead Channel Sensing Intrinsic Amplitude: 11.5 mV
Lead Channel Sensing Intrinsic Amplitude: 8.875 mV
Lead Channel Setting Pacing Amplitude: 2.5 V
Lead Channel Setting Pacing Pulse Width: 0.4 ms
Lead Channel Setting Sensing Sensitivity: 0.3 mV
Zone Setting Status: 755011
Zone Setting Status: 755011

## 2023-04-30 NOTE — Patient Instructions (Signed)
Medication Instructions:  Your physician recommends that you continue on your current medications as directed. Please refer to the Current Medication list given to you today. Your physician recommends that you continue on your current medications as directed. Please refer to the Current Medication list given to you today.  *If you need a refill on your cardiac medications before your next appointment, please call your pharmacy*  Lab Work: BMET-TODAY If you have labs (blood work) drawn today and your tests are completely normal, you will receive your results only by: MyChart Message (if you have MyChart) OR A paper copy in the mail If you have any lab test that is abnormal or we need to change your treatment, we will call you to review the results.  Follow-Up: At [facility], you and your health needs are our priority.  As part of our continuing mission to provide you with exceptional heart care, we have created designated Provider Care Teams.  These Care Teams include your primary Cardiologist (physician) and Advanced Practice Providers (APPs -  Physician Assistants and Nurse Practitioners) who all work together to provide you with the care you need, when you need it.  We recommend signing up for the patient portal called "MyChart".  Sign up information is provided on this After Visit Summary.  MyChart is used to connect with patients for Virtual Visits (Telemedicine).  Patients are able to view lab/test results, encounter notes, upcoming appointments, etc.  Non-urgent messages can be sent to your provider as well.   To learn more about what you can do with MyChart, go to [email or web address].    Your next appointment:   1 year(s)  Provider:   Casimiro Needle "Otilio Saber, PA-C    Keep upcoming appointment with Dr Allyson Sabal

## 2023-05-17 ENCOUNTER — Ambulatory Visit: Payer: 59 | Attending: Cardiovascular Disease | Admitting: Cardiovascular Disease

## 2023-05-20 ENCOUNTER — Encounter: Payer: Self-pay | Admitting: Cardiovascular Disease

## 2023-05-27 NOTE — Addendum Note (Signed)
 Addended by: Geralyn Flash D on: 05/27/2023 01:40 PM   Modules accepted: Orders

## 2023-05-27 NOTE — Progress Notes (Signed)
 Remote ICD transmission.

## 2023-07-22 ENCOUNTER — Ambulatory Visit (INDEPENDENT_AMBULATORY_CARE_PROVIDER_SITE_OTHER): Payer: Medicare Other

## 2023-07-22 DIAGNOSIS — I5022 Chronic systolic (congestive) heart failure: Secondary | ICD-10-CM

## 2023-07-22 DIAGNOSIS — I428 Other cardiomyopathies: Secondary | ICD-10-CM | POA: Diagnosis not present

## 2023-07-23 LAB — CUP PACEART REMOTE DEVICE CHECK
Battery Remaining Longevity: 87 mo
Battery Voltage: 2.99 V
Brady Statistic RV Percent Paced: 0 %
Date Time Interrogation Session: 20250513001702
HighPow Impedance: 67 Ohm
Implantable Lead Connection Status: 753985
Implantable Lead Implant Date: 20200508
Implantable Lead Location: 753860
Implantable Lead Model: 6935
Implantable Pulse Generator Implant Date: 20200508
Lead Channel Impedance Value: 399 Ohm
Lead Channel Impedance Value: 494 Ohm
Lead Channel Pacing Threshold Amplitude: 0.875 V
Lead Channel Pacing Threshold Pulse Width: 0.4 ms
Lead Channel Sensing Intrinsic Amplitude: 10.75 mV
Lead Channel Sensing Intrinsic Amplitude: 10.75 mV
Lead Channel Setting Pacing Amplitude: 2.5 V
Lead Channel Setting Pacing Pulse Width: 0.4 ms
Lead Channel Setting Sensing Sensitivity: 0.3 mV
Zone Setting Status: 755011
Zone Setting Status: 755011

## 2023-08-07 ENCOUNTER — Ambulatory Visit: Payer: Self-pay | Admitting: Cardiovascular Disease

## 2023-09-04 NOTE — Progress Notes (Signed)
 Remote ICD transmission.

## 2023-09-04 NOTE — Addendum Note (Signed)
 Addended by: TAWNI DRILLING D on: 09/04/2023 03:10 PM   Modules accepted: Orders

## 2023-10-21 ENCOUNTER — Ambulatory Visit: Payer: Medicare Other

## 2023-10-21 DIAGNOSIS — I428 Other cardiomyopathies: Secondary | ICD-10-CM

## 2023-10-21 LAB — CUP PACEART REMOTE DEVICE CHECK
Battery Remaining Longevity: 79 mo
Battery Voltage: 2.99 V
Brady Statistic RV Percent Paced: 0 %
Date Time Interrogation Session: 20250811091605
HighPow Impedance: 71 Ohm
Implantable Lead Connection Status: 753985
Implantable Lead Implant Date: 20200508
Implantable Lead Location: 753860
Implantable Lead Model: 6935
Implantable Pulse Generator Implant Date: 20200508
Lead Channel Impedance Value: 437 Ohm
Lead Channel Impedance Value: 494 Ohm
Lead Channel Pacing Threshold Amplitude: 0.75 V
Lead Channel Pacing Threshold Pulse Width: 0.4 ms
Lead Channel Sensing Intrinsic Amplitude: 11.625 mV
Lead Channel Sensing Intrinsic Amplitude: 11.625 mV
Lead Channel Setting Pacing Amplitude: 2.5 V
Lead Channel Setting Pacing Pulse Width: 0.4 ms
Lead Channel Setting Sensing Sensitivity: 0.3 mV
Zone Setting Status: 755011
Zone Setting Status: 755011

## 2023-12-06 NOTE — Progress Notes (Signed)
Remote ICD Transmission.

## 2024-01-20 ENCOUNTER — Ambulatory Visit: Payer: Self-pay | Admitting: Cardiology

## 2024-01-20 ENCOUNTER — Ambulatory Visit: Payer: Medicare Other

## 2024-01-20 DIAGNOSIS — I428 Other cardiomyopathies: Secondary | ICD-10-CM

## 2024-01-22 ENCOUNTER — Ambulatory Visit: Payer: Self-pay | Admitting: Cardiology

## 2024-01-22 LAB — CUP PACEART REMOTE DEVICE CHECK
Battery Remaining Longevity: 73 mo
Battery Voltage: 2.98 V
Brady Statistic RV Percent Paced: 0 %
Date Time Interrogation Session: 20251112001705
HighPow Impedance: 69 Ohm
Implantable Lead Connection Status: 753985
Implantable Lead Implant Date: 20200508
Implantable Lead Location: 753860
Implantable Lead Model: 6935
Implantable Pulse Generator Implant Date: 20200508
Lead Channel Impedance Value: 399 Ohm
Lead Channel Impedance Value: 494 Ohm
Lead Channel Pacing Threshold Amplitude: 0.75 V
Lead Channel Pacing Threshold Pulse Width: 0.4 ms
Lead Channel Sensing Intrinsic Amplitude: 11.875 mV
Lead Channel Sensing Intrinsic Amplitude: 11.875 mV
Lead Channel Setting Pacing Amplitude: 2.5 V
Lead Channel Setting Pacing Pulse Width: 0.4 ms
Lead Channel Setting Sensing Sensitivity: 0.3 mV
Zone Setting Status: 755011
Zone Setting Status: 755011

## 2024-01-23 NOTE — Progress Notes (Signed)
 Remote ICD Transmission

## 2024-07-20 ENCOUNTER — Encounter

## 2024-10-19 ENCOUNTER — Encounter

## 2025-01-18 ENCOUNTER — Encounter

## 2025-04-19 ENCOUNTER — Encounter
